# Patient Record
Sex: Female | Born: 1940
Health system: Southern US, Community
[De-identification: ages and names within clinical notes are randomized; demographics above are authoritative.]

## PROBLEM LIST (undated history)

## (undated) DIAGNOSIS — E785 Hyperlipidemia, unspecified: Secondary | ICD-10-CM

## (undated) DIAGNOSIS — M1990 Unspecified osteoarthritis, unspecified site: Secondary | ICD-10-CM

## (undated) DIAGNOSIS — K219 Gastro-esophageal reflux disease without esophagitis: Secondary | ICD-10-CM

## (undated) DIAGNOSIS — R011 Cardiac murmur, unspecified: Secondary | ICD-10-CM

## (undated) DIAGNOSIS — I10 Essential (primary) hypertension: Secondary | ICD-10-CM

## (undated) DIAGNOSIS — E119 Type 2 diabetes mellitus without complications: Secondary | ICD-10-CM

## (undated) DIAGNOSIS — I38 Endocarditis, valve unspecified: Secondary | ICD-10-CM

## (undated) DIAGNOSIS — M069 Rheumatoid arthritis, unspecified: Secondary | ICD-10-CM

## (undated) DIAGNOSIS — D649 Anemia, unspecified: Secondary | ICD-10-CM

## (undated) DIAGNOSIS — Z972 Presence of dental prosthetic device (complete) (partial): Secondary | ICD-10-CM

## (undated) HISTORY — PX: ABDOMINAL HYSTERECTOMY: SHX81

## (undated) HISTORY — PX: TONSILLECTOMY: SUR1361

## (undated) HISTORY — PX: CATARACT EXTRACTION: SUR2

## (undated) HISTORY — PX: OTHER SURGICAL HISTORY: SHX169

---

## 2005-06-07 ENCOUNTER — Ambulatory Visit: Payer: Self-pay

## 2005-12-05 ENCOUNTER — Ambulatory Visit: Payer: Self-pay | Admitting: General Surgery

## 2005-12-07 ENCOUNTER — Other Ambulatory Visit: Payer: Self-pay

## 2005-12-07 ENCOUNTER — Ambulatory Visit: Payer: Self-pay | Admitting: General Surgery

## 2005-12-14 ENCOUNTER — Ambulatory Visit: Payer: Self-pay | Admitting: General Surgery

## 2006-08-01 ENCOUNTER — Ambulatory Visit: Payer: Self-pay | Admitting: Internal Medicine

## 2007-09-11 ENCOUNTER — Ambulatory Visit: Payer: Self-pay | Admitting: Internal Medicine

## 2007-12-26 ENCOUNTER — Ambulatory Visit: Payer: Self-pay | Admitting: Gastroenterology

## 2008-04-30 ENCOUNTER — Ambulatory Visit: Payer: Self-pay | Admitting: Internal Medicine

## 2008-09-06 ENCOUNTER — Inpatient Hospital Stay: Payer: Self-pay | Admitting: *Deleted

## 2008-10-08 ENCOUNTER — Ambulatory Visit: Payer: Self-pay | Admitting: Internal Medicine

## 2008-11-03 ENCOUNTER — Ambulatory Visit: Payer: Self-pay | Admitting: Unknown Physician Specialty

## 2009-10-22 ENCOUNTER — Ambulatory Visit: Payer: Self-pay | Admitting: Internal Medicine

## 2010-11-16 ENCOUNTER — Ambulatory Visit: Payer: Self-pay | Admitting: Ophthalmology

## 2010-11-25 ENCOUNTER — Ambulatory Visit: Payer: Self-pay

## 2011-12-07 ENCOUNTER — Ambulatory Visit: Payer: Self-pay

## 2012-12-09 ENCOUNTER — Ambulatory Visit: Payer: Self-pay | Admitting: Physician Assistant

## 2013-12-10 ENCOUNTER — Ambulatory Visit: Payer: Self-pay

## 2014-06-16 ENCOUNTER — Ambulatory Visit: Payer: Self-pay | Admitting: Orthopedic Surgery

## 2014-08-05 ENCOUNTER — Ambulatory Visit: Payer: Self-pay | Admitting: Physician Assistant

## 2014-09-01 ENCOUNTER — Ambulatory Visit
Admit: 2014-09-01 | Disposition: A | Discharge status: Home / Self Care | Payer: Self-pay | Attending: Physician Assistant | Admitting: Physician Assistant

## 2014-12-15 ENCOUNTER — Other Ambulatory Visit: Payer: Self-pay | Admitting: Internal Medicine

## 2014-12-15 ENCOUNTER — Ambulatory Visit
Admission: RE | Admit: 2014-12-15 | Discharge: 2014-12-15 | Disposition: A | Discharge status: Home / Self Care | Payer: Commercial Managed Care - HMO | Source: Ambulatory Visit | Attending: Internal Medicine | Admitting: Internal Medicine

## 2014-12-15 DIAGNOSIS — Z1231 Encounter for screening mammogram for malignant neoplasm of breast: Secondary | ICD-10-CM

## 2015-06-09 DIAGNOSIS — M0579 Rheumatoid arthritis with rheumatoid factor of multiple sites without organ or systems involvement: Secondary | ICD-10-CM | POA: Diagnosis not present

## 2015-07-08 ENCOUNTER — Other Ambulatory Visit: Payer: Self-pay | Admitting: Physician Assistant

## 2015-07-08 ENCOUNTER — Ambulatory Visit
Admission: RE | Admit: 2015-07-08 | Discharge: 2015-07-08 | Disposition: A | Discharge status: Home / Self Care | Payer: Medicare HMO | Source: Ambulatory Visit | Attending: Physician Assistant | Admitting: Physician Assistant

## 2015-07-08 DIAGNOSIS — M79604 Pain in right leg: Secondary | ICD-10-CM | POA: Diagnosis not present

## 2015-07-08 DIAGNOSIS — M79661 Pain in right lower leg: Secondary | ICD-10-CM | POA: Diagnosis not present

## 2015-07-08 DIAGNOSIS — L03115 Cellulitis of right lower limb: Secondary | ICD-10-CM | POA: Diagnosis not present

## 2015-07-09 DIAGNOSIS — Z23 Encounter for immunization: Secondary | ICD-10-CM | POA: Diagnosis not present

## 2015-07-12 DIAGNOSIS — Z79899 Other long term (current) drug therapy: Secondary | ICD-10-CM | POA: Diagnosis not present

## 2015-07-13 ENCOUNTER — Encounter: Payer: Medicare HMO | Attending: Internal Medicine | Admitting: Internal Medicine

## 2015-07-13 DIAGNOSIS — N182 Chronic kidney disease, stage 2 (mild): Secondary | ICD-10-CM | POA: Insufficient documentation

## 2015-07-13 DIAGNOSIS — E1122 Type 2 diabetes mellitus with diabetic chronic kidney disease: Secondary | ICD-10-CM | POA: Diagnosis not present

## 2015-07-13 DIAGNOSIS — E1151 Type 2 diabetes mellitus with diabetic peripheral angiopathy without gangrene: Secondary | ICD-10-CM | POA: Insufficient documentation

## 2015-07-13 DIAGNOSIS — L989 Disorder of the skin and subcutaneous tissue, unspecified: Secondary | ICD-10-CM | POA: Diagnosis not present

## 2015-07-13 DIAGNOSIS — M79605 Pain in left leg: Secondary | ICD-10-CM | POA: Diagnosis not present

## 2015-07-13 DIAGNOSIS — L97211 Non-pressure chronic ulcer of right calf limited to breakdown of skin: Secondary | ICD-10-CM | POA: Insufficient documentation

## 2015-07-13 DIAGNOSIS — Z992 Dependence on renal dialysis: Secondary | ICD-10-CM | POA: Diagnosis not present

## 2015-07-13 DIAGNOSIS — R69 Illness, unspecified: Secondary | ICD-10-CM | POA: Diagnosis not present

## 2015-07-13 DIAGNOSIS — E11622 Type 2 diabetes mellitus with other skin ulcer: Secondary | ICD-10-CM | POA: Diagnosis not present

## 2015-07-13 DIAGNOSIS — I129 Hypertensive chronic kidney disease with stage 1 through stage 4 chronic kidney disease, or unspecified chronic kidney disease: Secondary | ICD-10-CM | POA: Insufficient documentation

## 2015-07-14 NOTE — Progress Notes (Signed)
[name], [name] ([id number]) Visit Report for 07/13/2015 Abuse/Suicide Risk Screen Details Patient Name: [name], [name]. Date of Service: 07/13/2015 8:00 AM Medical Record Patient Account Number: [id number] Number: Treating RN: Montey Hora 1941-03-12 (75 y.o. Other Clinician: Date of Birth/Sex: Female) Treating ROBSON, MICHAEL Primary Care Physician/Extender: Irene Limbo Physician: Referring Physician: Clayborn Bigness Weeks in Treatment: 0 Abuse/Suicide Risk Screen Items Answer ABUSE/SUICIDE RISK SCREEN: Has anyone close to you tried to hurt or harm you recentlyo No Do you feel uncomfortable with anyone in your familyo No Has anyone forced you do things that you didnot want to doo No Do you have any thoughts of harming yourselfo No Patient displays signs or symptoms of abuse and/or neglect. No Electronic Signature(s) Signed: 07/13/2015 4:27:35 PM By: Montey Hora Entered By: Montey Hora on 07/13/2015 08:29:25 [place], Luiza LMarland Kitchen ([id number]) -------------------------------------------------------------------------------- Activities of Daily Living Details Patient Name: [name], [name] Date of Service: 07/13/2015 8:00 AM Medical Record Patient Account Number: [id number] Number: Treating RN: Montey Hora 1941/03/10 (75 y.o. Other Clinician: Date of Birth/Sex: Female) Treating ROBSON, MICHAEL Primary Care Physician/Extender: Irene Limbo Physician: Referring Physician: Clayborn Bigness Weeks in Treatment: 0 Activities of Daily Living Items Answer Activities of Daily Living (Please select one for each item) Drive Automobile Completely Able Take Medications Completely Able Use Telephone Completely Able Care for Appearance Completely Able Use Toilet Completely Able Bath / Shower Completely Able Dress Self Completely Able Feed Self Completely Able Walk Completely Able Get In / Out Bed Completely Able Housework Completely Able Prepare  Meals Completely Able Handle Money Completely Able Shop for Self Completely Able Electronic Signature(s) Signed: 07/13/2015 4:27:35 PM By: Montey Hora Entered By: Montey Hora on 07/13/2015 08:29:42 Ballo, Rollen Sox ([id number]) -------------------------------------------------------------------------------- Education Assessment Details Patient Name: Becky Stall L. Date of Service: 07/13/2015 8:00 AM Medical Record Patient Account Number: [id number] Number: Treating RN: Montey Hora 1940/12/07 (75 y.o. Other Clinician: Date of Birth/Sex: Female) Treating ROBSON, MICHAEL Primary Care Physician/Extender: Irene Limbo Physician: Referring Physician: Clayborn Bigness Weeks in Treatment: 0 Primary Learner Assessed: Patient Learning Preferences/Education Level/Primary Language Learning Preference: Explanation, Demonstration Highest Education Level: High School Preferred Language: English Cognitive Barrier Assessment/Beliefs Language Barrier: No Translator Needed: No Memory Deficit: No Emotional Barrier: No Cultural/Religious Beliefs Affecting Medical No Care: Physical Barrier Assessment Impaired Vision: No Impaired Hearing: No Decreased Hand dexterity: No Knowledge/Comprehension Assessment Knowledge Level: Medium Comprehension Level: Medium Ability to understand written Medium instructions: Ability to understand verbal Medium instructions: Motivation Assessment Anxiety Level: Calm Cooperation: Cooperative Education Importance: Acknowledges Need Interest in Health Problems: Asks Questions Perception: Coherent Willingness to Engage in Self- Medium Management Activities: Medium IDELL, DUQUAINE ([id number]) Readiness to Engage in Self- Management Activities: Electronic Signature(s) Signed: 07/13/2015 4:27:35 PM By: Montey Hora Entered By: Montey Hora on 07/13/2015 08:30:45 Teasdale, Halina L.  ([id number]) -------------------------------------------------------------------------------- Fall Risk Assessment Details Patient Name: Becky Stall L. Date of Service: 07/13/2015 8:00 AM Medical Record Patient Account Number: [id number] Number: Treating RN: Montey Hora [birth year] (75 y.o. Other Clinician: Date of Birth/Sex: Female) Treating ROBSON, MICHAEL Primary Care Physician/Extender: Irene Limbo Physician: Referring Physician: Clayborn Bigness Weeks in Treatment: 0 Fall Risk Assessment Items Have you had 2 or more falls in the last 12 monthso 0 No Have you had any fall that resulted in injury in the last 12 monthso 0 No FALL RISK ASSESSMENT: History of falling - immediate or within 3 months 0 No Secondary diagnosis 0 No Ambulatory aid None/bed rest/wheelchair/nurse 0 Yes Crutches/cane/walker  0 No Furniture 0 No IV Access/Saline Lock 0 No Gait/Training Normal/bed rest/immobile 0 Yes Weak 0 No Impaired 0 No Mental Status Oriented to own ability 0 Yes Electronic Signature(s) Signed: 07/13/2015 4:27:35 PM By: Montey Hora Entered By: Montey Hora on 07/13/2015 08:30:54 Ramsay, Chere L. ([id number]) -------------------------------------------------------------------------------- Foot Assessment Details Patient Name: [name], [name] Date of Service: 07/13/2015 8:00 AM Medical Record Patient Account Number: [id number] Number: Treating RN: Montey Hora 1941/05/29 (75 y.o. Other Clinician: Date of Birth/Sex: Female) Treating ROBSON, MICHAEL Primary Care Physician/Extender: Irene Limbo Physician: Referring Physician: Clayborn Bigness Weeks in Treatment: 0 Foot Assessment Items Site Locations + = Sensation present, - = Sensation absent, C = Callus, U = Ulcer R = Redness, W = Warmth, M = Maceration, PU = Pre-ulcerative lesion F = Fissure, S = Swelling, D = Dryness Assessment Right: Left: Other Deformity: No No Prior Foot Ulcer: No  No Prior Amputation: No No Charcot Joint: No No Ambulatory Status: Ambulatory Without Help Gait: Steady Electronic Signature(s) Signed: 07/13/2015 4:27:35 PM By: Montey Hora Entered By: Montey Hora on 07/13/2015 08:31:19 Streater, Stella L. ([id number]) [place], [place] ([id number]) -------------------------------------------------------------------------------- Nutrition Risk Assessment Details Patient Name: Mcglory, Leanda L. Date of Service: 07/13/2015 8:00 AM Medical Record Patient Account Number: [id number] Number: Treating RN: Montey Hora 1941/05/05 (74 y.o. Other Clinician: Date of Birth/Sex: Female) Treating ROBSON, MICHAEL Primary Care Physician/Extender: Irene Limbo Physician: Referring Physician: Clayborn Bigness Weeks in Treatment: 0 Height (in): 62 Weight (lbs): 151 Body Mass Index (BMI): 27.6 Nutrition Risk Assessment Items NUTRITION RISK SCREEN: I have an illness or condition that made me change the kind and/or 0 No amount of food I eat I eat fewer than two meals per day 0 No I eat few fruits and vegetables, or milk products 0 No I have three or more drinks of beer, liquor or wine almost every day 0 No I have tooth or mouth problems that make it hard for me to eat 0 No I don't always have enough money to buy the food I need 0 No I eat alone most of the time 0 No I take three or more different prescribed or over-the-counter drugs a 1 Yes day Without wanting to, I have lost or gained 10 pounds in the last six 0 No months I am not always physically able to shop, cook and/or feed myself 0 No Nutrition Protocols Good Risk Protocol 0 No interventions needed Moderate Risk Protocol Electronic Signature(s) Signed: 07/13/2015 4:27:35 PM By: Montey Hora Entered By: Montey Hora on 07/13/2015 08:31:01

## 2015-07-15 NOTE — Progress Notes (Signed)
[name], [name] ([id number]) Visit Report for 07/13/2015 Allergy List Details Patient Name: [name], [name]. Date of Service: 07/13/2015 8:00 AM Medical Record Patient Account Number: [id number] Number: Treating RN: Montey Hora [birth year] (74 y.o. Other Clinician: Date of Birth/Sex: Female) Treating ROBSON, MICHAEL Primary Care Physician: Leretha Pol Physician/Extender: G Referring Physician: Clayborn Bigness Weeks in Treatment: 0 Allergies Active Allergies No Known Allergies Allergy Notes Electronic Signature(s) Signed: 07/13/2015 4:27:35 PM By: Montey Hora Entered By: Montey Hora on 07/13/2015 08:29:13 [name], [name] ([id number]) -------------------------------------------------------------------------------- Arrival Information Details Patient Name: Becky Stall L. Date of Service: 07/13/2015 8:00 AM Medical Record Patient Account Number: [id number] Number: Treating RN: Montey Hora 10-19-1940 (74 y.o. Other Clinician: Date of Birth/Sex: Female) Treating ROBSON, [place] Primary Care Physician: Leretha Pol Physician/Extender: G Referring Physician: Thomasenia Bottoms in Treatment: 0 Visit Information Patient Arrived: Ambulatory Arrival Time: 08:20 Accompanied By: self Transfer Assistance: None Patient Identification Verified: Yes Secondary Verification Process Yes Completed: Patient Has Alerts: Yes Patient Alerts: DMII NO ABI R LEG R/T PAIN Electronic Signature(s) Signed: 07/13/2015 4:27:35 PM By: Montey Hora Entered By: Montey Hora on 07/13/2015 08:45:41 [name], [name] ([id number]) -------------------------------------------------------------------------------- Clinic Level of Care Assessment Details Patient Name: Becky Stall L. Date of Service: 07/13/2015 8:00 AM Medical Record Patient Account Number: [id number] Number: Treating RN: Montey Hora 1941/05/25 (74 y.o. Other Clinician: Date of  Birth/Sex: Female) Treating ROBSON, [place] Primary Care Physician: Leretha Pol Physician/Extender: G Referring Physician: Clayborn Bigness Weeks in Treatment: 0 Clinic Level of Care Assessment Items TOOL 2 Quantity Score []  - Use when only an EandM is performed on the INITIAL visit 0 ASSESSMENTS - Nursing Assessment / Reassessment X - General Physical Exam (combine w/ comprehensive assessment (listed just 1 20 below) when performed on new pt. evals) X - Comprehensive Assessment (HX, ROS, Risk Assessments, Wounds Hx, etc.) 1 25 ASSESSMENTS - Wound and Skin Assessment / Reassessment []  - Simple Wound Assessment / Reassessment - one wound 0 []  - Complex Wound Assessment / Reassessment - multiple wounds 0 X - Dermatologic / Skin Assessment (not related to wound area) 1 10 ASSESSMENTS - Ostomy and/or Continence Assessment and Care []  - Incontinence Assessment and Management 0 []  - Ostomy Care Assessment and Management (repouching, etc.) 0 PROCESS - Coordination of Care X - Simple Patient / Family Education for ongoing care 1 15 []  - Complex (extensive) Patient / Family Education for ongoing care 0 X - Staff obtains Programmer, systems, Records, Test Results / Process Orders 1 10 []  - Staff telephones HHA, Nursing Homes / Clarify orders / etc 0 []  - Routine Transfer to another Facility (non-emergent condition) 0 []  - Routine Hospital Admission (non-emergent condition) 0 X - New Admissions / Biomedical engineer / Ordering NPWT, Apligraf, etc. 1 15 []  - Emergency Hospital Admission (emergent condition) 0 [name], [name] ([id number]) X - Simple Discharge Coordination 1 10 []  - Complex (extensive) Discharge Coordination 0 PROCESS - Special Needs []  - Pediatric / Minor Patient Management 0 []  - Isolation Patient Management 0 []  - Hearing / Language / Visual special needs 0 []  - Assessment of Community assistance (transportation, D/C planning, etc.) 0 []  - Additional assistance / Altered  mentation 0 []  - Support Surface(s) Assessment (bed, cushion, seat, etc.) 0 INTERVENTIONS - Wound Cleansing / Measurement []  - Wound Imaging (photographs - any number of wounds) 0 []  - Wound Tracing (instead of photographs) 0 []  - Simple Wound Measurement - one wound 0 []  - Complex Wound Measurement - multiple  wounds 0 []  - Simple Wound Cleansing - one wound 0 []  - Complex Wound Cleansing - multiple wounds 0 INTERVENTIONS - Wound Dressings []  - Small Wound Dressing one or multiple wounds 0 []  - Medium Wound Dressing one or multiple wounds 0 []  - Large Wound Dressing one or multiple wounds 0 []  - Application of Medications - injection 0 INTERVENTIONS - Miscellaneous []  - External ear exam 0 []  - Specimen Collection (cultures, biopsies, blood, body fluids, etc.) 0 []  - Specimen(s) / Culture(s) sent or taken to Lab for analysis 0 []  - Patient Transfer (multiple staff / Harrel Lemon Lift / Similar devices) 0 []  - Simple Staple / Suture removal (25 or less) 0 [name], [name] ([id number]) []  - Complex Staple / Suture removal (26 or more) 0 []  - Hypo / Hyperglycemic Management (close monitor of Blood Glucose) 0 X - Ankle / Brachial Index (ABI) - do not check if billed separately 1 15 Has the patient been seen at the hospital within the last three years: Yes Total Score: 120 Level Of Care: New/Established - Level 4 Electronic Signature(s) Signed: 07/13/2015 4:27:35 PM By: Montey Hora Entered By: Montey Hora on 07/13/2015 09:02:58 [name], [name] ([id number]) -------------------------------------------------------------------------------- Encounter Discharge Information Details Patient Name: Becky Stall L. Date of Service: 07/13/2015 8:00 AM Medical Record Patient Account Number: [id number] Number: Treating RN: Montey Hora July 06, 1940 (74 y.o. Other Clinician: Date of Birth/Sex: Female) Treating ROBSON, MICHAEL Primary Care Physician: Leretha Pol Physician/Extender: G Referring Physician: Thomasenia Bottoms in Treatment: 0 Encounter Discharge Information Items Discharge Pain Level: 0 Discharge Condition: Stable Ambulatory Status: Ambulatory Discharge Destination: Home Transportation: Private Auto Accompanied By: self Schedule Follow-up Appointment: Yes Medication Reconciliation completed and provided to Patient/Care No [name]: Provided on Clinical Summary of Care: 07/13/2015 Form Type Recipient Paper Patient MB Electronic Signature(s) Signed: 07/13/2015 9:14:14 AM By: Ruthine Dose Entered By: Ruthine Dose on 07/13/2015 09:14:14 [name], [name] ([id number]) -------------------------------------------------------------------------------- Lower Extremity Assessment Details Patient Name: Obryan, Sindee L. Date of Service: 07/13/2015 8:00 AM Medical Record Patient Account Number: [id number] Number: Treating RN: Montey Hora 1941/04/10 (74 y.o. Other Clinician: Date of Birth/Sex: Female) Treating ROBSON, [place] Primary Care Physician: Leretha Pol Physician/Extender: G Referring Physician: Clayborn Bigness Weeks in Treatment: 0 Edema Assessment Assessed: [Left: No] [Right: No] Edema: [Left: Yes] [Right: Yes] Calf Left: Right: Point of Measurement: 30 cm From Medial Instep 34.7 cm 35.9 cm Ankle Left: Right: Point of Measurement: 10 cm From Medial Instep 20.6 cm 21.3 cm Vascular Assessment Pulses: Posterior Tibial Palpable: [Left:Yes] [Right:Yes] Doppler: [Left:Multiphasic] [Right:Multiphasic] Dorsalis Pedis Palpable: [Left:Yes] [Right:Yes] Doppler: [Left:Monophasic] [Right:Multiphasic] Extremity colors, hair growth, and conditions: Extremity Color: [Left:Hyperpigmented] [Right:Hyperpigmented] Hair Growth on Extremity: [Left:No] [Right:No] Temperature of Extremity: [Left:Hot] [Right:Warm] Capillary Refill: [Left:< 3 seconds] [Right:< 3 seconds] Blood Pressure: Brachial:  [Left:156] Dorsalis Pedis: 190 [Left:Dorsalis Pedis:] Ankle: Posterior Tibial: 192 [Left:Posterior Tibial: 1.23] Toe Nail Assessment Left: Right: Thick: Yes Yes Discolored: Yes Yes [name], [name] ([id number]) Deformed: No No Improper Length and Hygiene: No No Notes PT COULD NOT TOLERATE ABI IN R LEG R/T PAIN Electronic Signature(s) Signed: 07/13/2015 4:27:35 PM By: Montey Hora Entered By: Montey Hora on 07/13/2015 08:45:21 [name], [name] ([id number]) -------------------------------------------------------------------------------- Multi Wound Chart Details Patient Name: [name], [name] Date of Service: 07/13/2015 8:00 AM Medical Record Patient Account Number: [id number] Number: Treating RN: Montey Hora 1940-11-20 (74 y.o. Other Clinician: Date of Birth/Sex: Female) Treating ROBSON, [place] Primary Care Physician: Leretha Pol Physician/Extender: G Referring Physician: Thomasenia Bottoms  in Treatment: 0 Vital Signs Height(in): 62 Pulse(bpm): 79 Weight(lbs): 151 Blood Pressure 156/90 (mmHg): Body Mass Index(BMI): 28 Temperature(F): 97.8 Respiratory Rate 18 (breaths/min): Wound Assessments Treatment Notes Electronic Signature(s) Signed: 07/13/2015 4:27:35 PM By: Montey Hora Entered By: Montey Hora on 07/13/2015 09:07:54 [name], [name] ([id number]) -------------------------------------------------------------------------------- [place] Details Patient Name: Becky Stall L. Date of Service: 07/13/2015 8:00 AM Medical Record Patient Account Number: [id number] Number: Treating RN: Montey Hora 02/21/1941 (74 y.o. Other Clinician: Date of Birth/Sex: Female) Treating ROBSON, [place] Primary Care Physician: Leretha Pol Physician/Extender: G Referring Physician: Clayborn Bigness Weeks in Treatment: 0 Active Inactive Orientation to the Wound Care Program Nursing Diagnoses: Knowledge deficit  related to the wound healing center program Goals: Patient/caregiver will verbalize understanding of the [place] Program Date Initiated: 07/13/2015 Goal Status: Active Interventions: Provide education on orientation to the wound center Notes: Electronic Signature(s) Signed: 07/13/2015 4:27:35 PM By: Montey Hora Entered By: Montey Hora on 07/13/2015 09:07:40 Lefevre, [name] ([id number]) -------------------------------------------------------------------------------- Patient/Caregiver Education Details Patient Name: Becky Stall L. Date of Service: 07/13/2015 8:00 AM Medical Record Patient Account Number: [id number] Number: Treating RN: Montey Hora 06-28-1940 (74 y.o. Other Clinician: Date of Birth/Gender: Female) Treating ROBSON, [place] Primary Care Physician: Leretha Pol Physician/Extender: G Referring Physician: Thomasenia Bottoms in Treatment: 0 Education Assessment Education Provided To: Patient Education Topics Provided Basic Hygiene: Handouts: Other: skin care with lotion and ace wrap Methods: Explain/Verbal Responses: State content correctly Electronic Signature(s) Signed: 07/13/2015 4:27:35 PM By: Montey Hora Entered By: Montey Hora on 07/13/2015 09:08:13 Schramm, Lurena L. ([id number]) -------------------------------------------------------------------------------- [place] Details Patient Name: Becky Stall L. Date of Service: 07/13/2015 8:00 AM Medical Record Patient Account Number: [id number] Number: Treating RN: Montey Hora 1940/11/16 (74 y.o. Other Clinician: Date of Birth/Sex: Female) Treating ROBSON, [place] Primary Care Physician: Leretha Pol Physician/Extender: G Referring Physician: Clayborn Bigness Weeks in Treatment: 0 Vital Signs Time Taken: 08:25 Temperature (F): 97.8 Height (in): 62 Pulse (bpm): 79 Source: Stated Respiratory Rate (breaths/min): 18 Weight (lbs): 151 Blood Pressure  (mmHg): 156/90 Source: Measured Reference Range: 80 - 120 mg / dl Body Mass Index (BMI): 27.6 Electronic Signature(s) Signed: 07/13/2015 4:27:35 PM By: Montey Hora Entered By: Montey Hora on 07/13/2015 08:28:36

## 2015-07-15 NOTE — Progress Notes (Signed)
Becky, [name] ([id number]) Visit Report for 07/13/2015 Chief Complaint Document Details Patient Name: [name], [name]. Date of Service: 07/13/2015 8:00 AM Medical Record Patient Account Number: [id number] Number: Treating RN: Montey Hora 1941-03-11 (75 y.o. Other Clinician: Date of Birth/Sex: Female) Treating ROBSON, MICHAEL Primary Care Physician/Extender: Irene Limbo Physician: Referring Physician: Clayborn Bigness Weeks in Treatment: 0 Information Obtained from: Patient Chief Complaint Patient is referred here for erythema and swelling on the medial right leg. There is actually not a wound Electronic Signature(s) Signed: 07/14/2015 5:04:49 PM By: Linton Ham MD Entered By: Linton Ham on 07/13/2015 09:28:10 [name], [name] ([id number]) -------------------------------------------------------------------------------- HPI Details Patient Name: [name] L. Date of Service: 07/13/2015 8:00 AM Medical Record Patient Account Number: [id number] Number: Treating RN: Montey Hora 1940/09/19 (75 y.o. Other Clinician: Date of Birth/Sex: Female) Treating ROBSON, MICHAEL Primary Care Physician/Extender: Irene Limbo Physician: Referring Physician: Clayborn Bigness Weeks in Treatment: 0 History of Present Illness HPI Description: 07/13/15; this is a patient who was sent here for review of an area on her right medial leg. She was seen by her primary care doctor last week and started on Keflex. Venous studies did not show DVT or evidence of reflux. She does not actually have an open wound. She tells me that roughly 3 weeks ago she developed some erythema and swelling on the medial right leg making it look different from her left leg. This has progressed and become very tender. She has not been systemically unwell. She is a diabetic. She is also recently treated for herpes zoster over her left anterior lower thigh. She has chronic  renal insufficiency and is listed as having PAD. Her ABI in this clinic was 1.23 on the left. Could not be obtained on the right due to pain. She was also sent for a duplex ultrasound of the right leg that did not show evidence of a DVT or reflux Electronic Signature(s) Signed: 07/14/2015 5:04:49 PM By: Linton Ham MD Entered By: Linton Ham on 07/13/2015 10:56:28 [name], [name] ([id number]) -------------------------------------------------------------------------------- Physical Exam Details Patient Name: [name], [name] Date of Service: 07/13/2015 8:00 AM Medical Record Patient Account Number: [id number] Number: Treating RN: Montey Hora 04-10-1941 (75 y.o. Other Clinician: Date of Birth/Sex: Female) Treating ROBSON, MICHAEL Primary Care Physician/Extender: Irene Limbo Physician: Referring Physician: Clayborn Bigness Weeks in Treatment: 0 Constitutional Patient is hypertensive.. Pulse regular and within target range for patient.Marland Kitchen Respirations regular, non-labored and within target range.. Temperature is normal and within the target range for the patient.Marland Kitchen Respiratory Respiratory effort is easy and symmetric bilaterally. Rate is normal at rest and on room air.. Bilateral breath sounds are clear and equal in all lobes with no wheezes, rales or rhonchi.. Cardiovascular 2/6 systolic ejection murmur that does not radiate. On the left I can feel both dorsalis pedis and posterior tibial pulses. On the right I can only feel her posterior tibial.. What looks to be an area of venous insufficiency with hemosiderin deposition in the right medial leg. Lymphatic Inguinal and popliteal nodes are not palpable. Integumentary (Hair, Skin) Healing lesions on her left anterior thigh above the knee compatible with the advertised zoster. Notes Wound exam; there is no open wound per se. She does have a discolored area on the right medial leg from her ankle going caudally.  Above the discolored area there appears to be some firmness. The entire area is tender. It doesn't really have the appearance of cellulitis. Electronic Signature(s) Signed: 07/14/2015 5:04:49 PM By:  Linton Ham MD Entered By: Linton Ham on 07/13/2015 10:52:33 [name] ([id number]) -------------------------------------------------------------------------------- Physician Orders Details Patient Name: [name] L. Date of Service: 07/13/2015 8:00 AM Medical Record Patient Account Number: [id number] Number: Treating RN: Montey Hora [birth year] (75 y.o. Other Clinician: Date of Birth/Sex: Female) Treating ROBSON, MICHAEL Primary Care Physician/Extender: Irene Limbo Physician: Referring Physician: Clayborn Bigness Weeks in Treatment: 0 Verbal / Phone Orders: Yes Clinician: Montey Hora Read Back and Verified: Yes Diagnosis Coding ICD-10 Coding Code Description [id number] Non-pressure chronic ulcer of right calf limited to breakdown of skin E11.622 Type 2 diabetes mellitus with other skin ulcer E11.51 Type 2 diabetes mellitus with diabetic peripheral angiopathy without gangrene E11.22 Type 2 diabetes mellitus with diabetic chronic kidney disease Follow-up Appointments o Return Appointment in 1 week. Edema Control o Other: - ace wrap daily Electronic Signature(s) Signed: 07/13/2015 4:27:35 PM By: Montey Hora Signed: 07/14/2015 5:04:49 PM By: Linton Ham MD Entered By: Montey Hora on 07/13/2015 09:07:24 [name], [name] ([id number]) -------------------------------------------------------------------------------- Problem List Details Patient Name: [name], [name] Date of Service: 07/13/2015 8:00 AM Medical Record Patient Account Number: [id number] Number: Treating RN: Montey Hora Nov 16, 1940 (75 y.o. Other Clinician: Date of Birth/Sex: Female) Treating ROBSON, MICHAEL Primary Care Physician/Extender: Irene Limbo Physician: Referring Physician: Clayborn Bigness Weeks in Treatment: 0 Active Problems ICD-10 Encounter Code Description Active Date Diagnosis L97.211 Non-pressure chronic ulcer of right calf limited to 07/13/2015 Yes breakdown of skin E11.622 Type 2 diabetes mellitus with other skin ulcer 07/13/2015 Yes E11.51 Type 2 diabetes mellitus with diabetic peripheral 07/13/2015 Yes angiopathy without gangrene E11.22 Type 2 diabetes mellitus with diabetic chronic kidney 07/13/2015 Yes disease Inactive Problems Resolved Problems Electronic Signature(s) Signed: 07/14/2015 5:04:49 PM By: Linton Ham MD Entered By: Linton Ham on 07/13/2015 09:27:42 Sherk, Samara L. ([id number]) -------------------------------------------------------------------------------- Progress Note Details Patient Name: Mesmer, [name] Date of Service: 07/13/2015 8:00 AM Medical Record Patient Account Number: [id number] Number: Treating RN: Montey Hora 06-21-1940 (74 y.o. Other Clinician: Date of Birth/Sex: Female) Treating ROBSON, MICHAEL Primary Care Physician/Extender: Irene Limbo Physician: Referring Physician: Clayborn Bigness Weeks in Treatment: 0 Subjective Chief Complaint Information obtained from Patient Patient is referred here for erythema and swelling on the medial right leg. There is actually not a wound History of Present Illness (HPI) 07/13/15; this is a patient who was sent here for review of an area on her right medial leg. She was seen by her primary care doctor last week and started on Keflex. Venous studies did not show DVT or evidence of reflux. She does not actually have an open wound. She tells me that roughly 3 weeks ago she developed some erythema and swelling on the medial right leg making it look different from her left leg. This has progressed and become very tender. She has not been systemically unwell. She is a diabetic. She is also recently treated for  herpes zoster over her left anterior lower thigh. She has chronic renal insufficiency and is listed as having PAD. Her ABI in this clinic was 1.23 on the left. Could not be obtained on the right due to pain. Wound History Patient reportedly has not tested positive for osteomyelitis. Patient reportedly has had testing performed to evaluate circulation in the legs. Patient experiences the following problems associated with their wounds: infection. Patient History Information obtained from Patient. Allergies No Known Allergies Social History Never smoker, Marital Status - Widowed, Alcohol Use - Never, Drug Use - No History, Caffeine Use -  Never. Medical History Cardiovascular Patient has history of Hypertension Endocrine Valli, Chester. ([id number]) Patient has history of Type II Diabetes Musculoskeletal Patient has history of Osteoarthritis Oncologic Denies history of Received Chemotherapy, Received Radiation Patient is treated with Oral Agents. Blood sugar is tested. Medical And Surgical History Notes Immunological shingles 2 months ago Review of Systems (ROS) Constitutional Symptoms (General Health) The patient has no complaints or symptoms. Eyes The patient has no complaints or symptoms. Ear/Nose/Mouth/Throat The patient has no complaints or symptoms. Hematologic/Lymphatic The patient has no complaints or symptoms. Respiratory The patient has no complaints or symptoms. Cardiovascular The patient has no complaints or symptoms. Gastrointestinal The patient has no complaints or symptoms. Genitourinary Complains or has symptoms of Kidney failure/ Dialysis - CKD stage 2. Immunological The patient has no complaints or symptoms. Integumentary (Skin) The patient has no complaints or symptoms. Neurologic The patient has no complaints or symptoms. Oncologic The patient has no complaints or symptoms. Psychiatric The patient has no complaints or  symptoms. Objective Constitutional Patient is hypertensive.. Pulse regular and within target range for patient.Marland Kitchen Respirations regular, non-labored and within target range.. Temperature is normal and within the target range for the patient.Marland Kitchen Lyster, [name] ([id number]) Vitals Time Taken: 8:25 AM, Height: 62 in, Source: Stated, Weight: 151 lbs, Source: Measured, BMI: 27.6, Temperature: 97.8 F, Pulse: 79 bpm, Respiratory Rate: 18 breaths/min, Blood Pressure: 156/90 mmHg. Respiratory Respiratory effort is easy and symmetric bilaterally. Rate is normal at rest and on room air.. Bilateral breath sounds are clear and equal in all lobes with no wheezes, rales or rhonchi.. Cardiovascular 2/6 systolic ejection murmur that does not radiate. On the left I can feel both dorsalis pedis and posterior tibial pulses. On the right I can only feel her posterior tibial.. What looks to be an area of venous insufficiency with hemosiderin deposition in the right medial leg. Lymphatic Inguinal and popliteal nodes are not palpable. General Notes: Wound exam; there is no open wound per se. She does have a discolored area on the right medial leg from her ankle going caudally. Above the discolored area there appears to be some firmness. The entire area is tender. It doesn't really have the appearance of cellulitis. Integumentary (Hair, Skin) Healing lesions on her left anterior thigh above the knee compatible with the advertised zoster. Assessment Active Problems ICD-10 L97.211 - Non-pressure chronic ulcer of right calf limited to breakdown of skin E11.622 - Type 2 diabetes mellitus with other skin ulcer E11.51 - Type 2 diabetes mellitus with diabetic peripheral angiopathy without gangrene E11.22 - Type 2 diabetes mellitus with diabetic chronic kidney disease Plan Follow-up Appointments: Return Appointment in 1 week. Edema Control: Other: - ace wrap daily Bendall, Angely L. ([id number]) I am not  completely certain what we are dealing with here. Her primary physician thought that this was cellulitis and put her on Keflex late last week. I am not certain that there is been any improvement. The patient denies any trauma eyes somewhat worried about a hematoma nevertheless to. She is only on aspirin. I think the correct approach here is to put some support over the skin that and we have done a light Ace wrap and shown her how to do this. She follows up with her primary doctor on Friday. If this progresses she may need a CT scan of her leg. I will look at this again next week Electronic Signature(s) Signed: 07/14/2015 5:04:49 PM By: Linton Ham MD Entered By: Linton Ham on 07/13/2015 10:54:56 Moorman, Becky  L. ([id number]) -------------------------------------------------------------------------------- ROS/PFSH Details Patient Name: Mckoy, [name] Date of Service: 07/13/2015 8:00 AM Medical Record Patient Account Number: [id number] Number: Treating RN: Montey Hora March 03, 1941 (74 y.o. Other Clinician: Date of Birth/Sex: Female) Treating ROBSON, MICHAEL Primary Care Physician/Extender: Irene Limbo Physician: Referring Physician: Clayborn Bigness Weeks in Treatment: 0 Information Obtained From Patient Wound History Do you currently have one or more open woundso No Have you tested positive for osteomyelitis (bone infection)o No Have you had any tests for circulation on your legso Yes Who ordered the testo PCP [facility] was the test [facility]o [facility] Have you had other problems associated with your woundso Infection Genitourinary Complaints and Symptoms: Positive for: Kidney failure/ Dialysis - CKD stage 2 Constitutional Symptoms (General Health) Complaints and Symptoms: No Complaints or Symptoms Eyes Complaints and Symptoms: No Complaints or Symptoms Ear/Nose/Mouth/Throat Complaints and Symptoms: No Complaints or Symptoms Hematologic/Lymphatic Complaints and  Symptoms: No Complaints or Symptoms Respiratory Complaints and Symptoms: No Complaints or Symptoms Alridge, Keyon L. ([id number]) Cardiovascular Complaints and Symptoms: No Complaints or Symptoms Medical History: Positive for: Hypertension Gastrointestinal Complaints and Symptoms: No Complaints or Symptoms Endocrine Medical History: Positive for: Type II Diabetes Time with diabetes: 8 years Treated with: Oral agents Blood sugar tested every day: Yes Tested : BID Immunological Complaints and Symptoms: No Complaints or Symptoms Medical History: Past Medical History Notes: shingles 2 months ago Integumentary (Skin) Complaints and Symptoms: No Complaints or Symptoms Musculoskeletal Medical History: Positive for: Osteoarthritis Neurologic Complaints and Symptoms: No Complaints or Symptoms Oncologic Complaints and Symptoms: No Complaints or Symptoms Medical History: Kostka, [name] ([id number]) Negative for: Received Chemotherapy; Received Radiation Psychiatric Complaints and Symptoms: No Complaints or Symptoms Family and Social History Never smoker; Marital Status - Widowed; Alcohol Use: Never; Drug Use: No History; Caffeine Use: Never; Financial Concerns: No; Food, Clothing or Shelter Needs: No; Support System Lacking: No; Transportation Concerns: No; Advanced Directives: No; Patient does not want information on Advanced Directives Electronic Signature(s) Signed: 07/13/2015 4:27:35 PM By: Montey Hora Signed: 07/14/2015 5:04:49 PM By: Linton Ham MD Entered By: Montey Hora on 07/13/2015 08:35:38 Caya, [name] ([id number]) -------------------------------------------------------------------------------- [place] Details Patient Name: Nierman, [name] Date of Service: 07/13/2015 Medical Record Patient Account Number: [id number] Number: Treating RN: Montey Hora 1941-03-04 (74 y.o. Other Clinician: Date of Birth/Sex: Female) Treating  ROBSON, MICHAEL Primary Care Physician/Extender: Irene Limbo Physician: Weeks in Treatment: 0 Referring Physician: Clayborn Bigness Diagnosis Coding ICD-10 Codes Code Description [id number] Non-pressure chronic ulcer of right calf limited to breakdown of skin E11.622 Type 2 diabetes mellitus with other skin ulcer E11.51 Type 2 diabetes mellitus with diabetic peripheral angiopathy without gangrene E11.22 Type 2 diabetes mellitus with diabetic chronic kidney disease Facility Procedures CPT4 Code: [id number] Description: 99214 - WOUND CARE VISIT-LEV 4 EST PT Modifier: Quantity: 1 Physician Procedures CPT4 Code Description: [id number] WC PHYS LEVEL 3 o NEW PT ICD-10 Description Diagnosis L97.211 Non-pressure chronic ulcer of right calf limited t Modifier: o breakdown of Quantity: 1 skin Electronic Signature(s) Signed: 07/14/2015 5:04:49 PM By: Linton Ham MD Entered By: Linton Ham on 07/13/2015 10:55:30

## 2015-07-16 DIAGNOSIS — M7981 Nontraumatic hematoma of soft tissue: Secondary | ICD-10-CM | POA: Diagnosis not present

## 2015-07-20 ENCOUNTER — Encounter: Payer: Medicare HMO | Admitting: Internal Medicine

## 2015-07-20 DIAGNOSIS — I129 Hypertensive chronic kidney disease with stage 1 through stage 4 chronic kidney disease, or unspecified chronic kidney disease: Secondary | ICD-10-CM | POA: Diagnosis not present

## 2015-07-20 DIAGNOSIS — E11622 Type 2 diabetes mellitus with other skin ulcer: Secondary | ICD-10-CM | POA: Diagnosis not present

## 2015-07-20 DIAGNOSIS — E11628 Type 2 diabetes mellitus with other skin complications: Secondary | ICD-10-CM | POA: Diagnosis not present

## 2015-07-20 DIAGNOSIS — L97211 Non-pressure chronic ulcer of right calf limited to breakdown of skin: Secondary | ICD-10-CM | POA: Diagnosis not present

## 2015-07-20 DIAGNOSIS — E1151 Type 2 diabetes mellitus with diabetic peripheral angiopathy without gangrene: Secondary | ICD-10-CM | POA: Diagnosis not present

## 2015-07-20 DIAGNOSIS — N182 Chronic kidney disease, stage 2 (mild): Secondary | ICD-10-CM | POA: Diagnosis not present

## 2015-07-20 DIAGNOSIS — L989 Disorder of the skin and subcutaneous tissue, unspecified: Secondary | ICD-10-CM | POA: Diagnosis not present

## 2015-07-20 DIAGNOSIS — E1122 Type 2 diabetes mellitus with diabetic chronic kidney disease: Secondary | ICD-10-CM | POA: Diagnosis not present

## 2015-07-20 DIAGNOSIS — Z992 Dependence on renal dialysis: Secondary | ICD-10-CM | POA: Diagnosis not present

## 2015-07-21 NOTE — Progress Notes (Signed)
[name], [name] ([id number]) Visit Report for 07/20/2015 Arrival Information Details Patient Name: [name], [name]. Date of Service: 07/20/2015 8:00 AM Medical Record Patient Account Number: [id number] Number: Treating RN: Montey Hora [birth year] (74 y.o. Other Clinician: Date of Birth/Sex: Female) Treating ROBSON, [place] Primary Care Physician: Leretha Pol Physician/Extender: G Referring Physician: Aquilla Hacker in Treatment: 1 Visit Information History Since Last Visit Added or deleted any medications: No Patient Arrived: Ambulatory Any new allergies or adverse reactions: No Arrival Time: 08:04 Had a fall or experienced change in No Accompanied By: self activities of daily living that may affect Transfer Assistance: None risk of falls: Patient Identification Verified: Yes Signs or symptoms of abuse/neglect since last No Secondary Verification Process Yes visito Completed: Hospitalized since last visit: No Patient Has Alerts: Yes Pain Present Now: No Patient Alerts: DMII NO ABI R LEG R/T PAIN Electronic Signature(s) Signed: 07/20/2015 12:56:21 PM By: Montey Hora Entered By: Montey Hora on 07/20/2015 08:06:55 Sanmiguel, Kendria L. ([id number]) -------------------------------------------------------------------------------- Clinic Level of Care Assessment Details Patient Name: Becky Stall L. Date of Service: 07/20/2015 8:00 AM Medical Record Patient Account Number: [id number] Number: Treating RN: Montey Hora 03/10/1941 (74 y.o. Other Clinician: Date of Birth/Sex: Female) Treating ROBSON, [place] Primary Care Physician: Leretha Pol Physician/Extender: G Referring Physician: Aquilla Hacker in Treatment: 1 Clinic Level of Care Assessment Items TOOL 4 Quantity Score []  - Use when only an EandM is performed on FOLLOW-UP visit 0 ASSESSMENTS - Nursing Assessment / Reassessment X - Reassessment of Co-morbidities  (includes updates in patient status) 1 10 X - Reassessment of Adherence to Treatment Plan 1 5 ASSESSMENTS - Wound and Skin Assessment / Reassessment []  - Simple Wound Assessment / Reassessment - one wound 0 []  - Complex Wound Assessment / Reassessment - multiple wounds 0 X - Dermatologic / Skin Assessment (not related to wound area) 1 10 ASSESSMENTS - Focused Assessment X - Circumferential Edema Measurements - multi extremities 1 5 []  - Nutritional Assessment / Counseling / Intervention 0 X - Lower Extremity Assessment (monofilament, tuning fork, pulses) 1 5 []  - Peripheral Arterial Disease Assessment (using hand held doppler) 0 ASSESSMENTS - Ostomy and/or Continence Assessment and Care []  - Incontinence Assessment and Management 0 []  - Ostomy Care Assessment and Management (repouching, etc.) 0 PROCESS - Coordination of Care X - Simple Patient / Family Education for ongoing care 1 15 []  - Complex (extensive) Patient / Family Education for ongoing care 0 []  - Staff obtains Programmer, systems, Records, Test Results / Process Orders 0 []  - Staff telephones HHA, Nursing Homes / Clarify orders / etc 0 Gaida, [name] ([id number]) []  - Routine Transfer to another Facility (non-emergent condition) 0 []  - Routine Hospital Admission (non-emergent condition) 0 []  - New Admissions / Biomedical engineer / Ordering NPWT, Apligraf, etc. 0 []  - Emergency Hospital Admission (emergent condition) 0 X - Simple Discharge Coordination 1 10 []  - Complex (extensive) Discharge Coordination 0 PROCESS - Special Needs []  - Pediatric / Minor Patient Management 0 []  - Isolation Patient Management 0 []  - Hearing / Language / Visual special needs 0 []  - Assessment of Community assistance (transportation, D/C planning, etc.) 0 []  - Additional assistance / Altered mentation 0 []  - Support Surface(s) Assessment (bed, cushion, seat, etc.) 0 INTERVENTIONS - Wound Cleansing / Measurement []  - Simple Wound Cleansing - one  wound 0 []  - Complex Wound Cleansing - multiple wounds 0 []  - Wound Imaging (photographs - any number of wounds) 0 []  - Wound Tracing (  instead of photographs) 0 []  - Simple Wound Measurement - one wound 0 []  - Complex Wound Measurement - multiple wounds 0 INTERVENTIONS - Wound Dressings []  - Small Wound Dressing one or multiple wounds 0 []  - Medium Wound Dressing one or multiple wounds 0 []  - Large Wound Dressing one or multiple wounds 0 []  - Application of Medications - topical 0 []  - Application of Medications - injection 0 Branton, [name] ([id number]) INTERVENTIONS - Miscellaneous []  - External ear exam 0 []  - Specimen Collection (cultures, biopsies, blood, body fluids, etc.) 0 []  - Specimen(s) / Culture(s) sent or taken to Lab for analysis 0 []  - Patient Transfer (multiple staff / Harrel Lemon Lift / Similar devices) 0 []  - Simple Staple / Suture removal (25 or less) 0 []  - Complex Staple / Suture removal (26 or more) 0 []  - Hypo / Hyperglycemic Management (close monitor of Blood Glucose) 0 []  - Ankle / Brachial Index (ABI) - do not check if billed separately 0 X - Vital Signs 1 5 Has the patient been seen at the hospital within the last three years: Yes Total Score: 65 Level Of Care: New/Established - Level 2 Electronic Signature(s) Signed: 07/20/2015 12:56:21 PM By: Montey Hora Entered By: Montey Hora on 07/20/2015 08:30:10 [name], [name] ([id number]) -------------------------------------------------------------------------------- Encounter Discharge Information Details Patient Name: Becky Stall L. Date of Service: 07/20/2015 8:00 AM Medical Record Patient Account Number: [id number] Number: Treating RN: Montey Hora 1940-08-05 (74 y.o. Other Clinician: Date of Birth/Sex: Female) Treating ROBSON, MICHAEL Primary Care Physician: Leretha Pol Physician/Extender: G Referring Physician: Aquilla Hacker in Treatment: 1 Encounter Discharge  Information Items Discharge Pain Level: 0 Discharge Condition: Stable Ambulatory Status: Ambulatory Discharge Destination: Home Transportation: Private Auto Accompanied By: self Schedule Follow-up Appointment: Yes Medication Reconciliation completed and provided to Patient/Care No Kwamaine Cuppett: Provided on Clinical Summary of Care: 07/20/2015 Form Type Recipient Paper Patient MB Electronic Signature(s) Signed: 07/20/2015 8:39:32 AM By: Sharon Mt Entered By: Sharon Mt on 07/20/2015 08:39:32 Preis, [name] ([id number]) -------------------------------------------------------------------------------- Lower Extremity Assessment Details Patient Name: [name], [name] Date of Service: 07/20/2015 8:00 AM Medical Record Patient Account Number: [id number] Number: Treating RN: Montey Hora 1940-09-27 (74 y.o. Other Clinician: Date of Birth/Sex: Female) Treating ROBSON, Elias-Fela Solis Primary Care Physician: Leretha Pol Physician/Extender: G Referring Physician: Leretha Pol Weeks in Treatment: 1 Edema Assessment Assessed: [Left: No] [Right: No] Edema: [Left: Ye] [Right: s] Calf Left: Right: Point of Measurement: 30 cm From Medial Instep cm 35.4 cm Ankle Left: Right: Point of Measurement: 10 cm From Medial Instep cm 21.4 cm Vascular Assessment Pulses: Posterior Tibial Dorsalis Pedis Palpable: [Right:Yes] Extremity colors, hair growth, and conditions: Extremity Color: [Right:Hyperpigmented] Hair Growth on Extremity: [Right:No] Temperature of Extremity: [Right:Warm] Capillary Refill: [Right:< 3 seconds] Electronic Signature(s) Signed: 07/20/2015 12:56:21 PM By: Montey Hora Entered By: Montey Hora on 07/20/2015 08:13:41 Casamento, Nadelyn L. ([id number]) -------------------------------------------------------------------------------- Multi Wound Chart Details Patient Name: [name], [name] Date of Service: 07/20/2015 8:00 AM Medical Record Patient Account  Number: [id number] Number: Treating RN: Montey Hora 10/15/1940 (74 y.o. Other Clinician: Date of Birth/Sex: Female) Treating ROBSON, [place] Primary Care Physician: Leretha Pol Physician/Extender: G Referring Physician: Leretha Pol Weeks in Treatment: 1 Vital Signs Height(in): 62 Pulse(bpm): 56 Weight(lbs): 151 Blood Pressure 159/53 (mmHg): Body Mass Index(BMI): 28 Temperature(F): 98.4 Respiratory Rate 18 (breaths/min): Wound Assessments Treatment Notes Electronic Signature(s) Signed: 07/20/2015 12:56:21 PM By: Montey Hora Entered By: Montey Hora on 07/20/2015 08:29:04 [name], [name] ([id number]) -------------------------------------------------------------------------------- [place] Details  Patient Name: [name], EULL. Date of Service: 07/20/2015 8:00 AM Medical Record Patient Account Number: [id number] Number: Treating RN: Montey Hora Sep 29, 1940 (74 y.o. Other Clinician: Date of Birth/Sex: Female) Treating ROBSON, [place] Primary Care Physician: Leretha Pol Physician/Extender: G Referring Physician: Aquilla Hacker in Treatment: 1 Active Inactive Orientation to the Wound Care Program Nursing Diagnoses: Knowledge deficit related to the wound healing center program Goals: Patient/caregiver will verbalize understanding of the [place] Program Date Initiated: 07/13/2015 Goal Status: Active Interventions: Provide education on orientation to the wound center Notes: Electronic Signature(s) Signed: 07/20/2015 12:56:21 PM By: Montey Hora Entered By: Montey Hora on 07/20/2015 08:28:57 Dellarocco, [name] ([id number]) -------------------------------------------------------------------------------- Patient/Caregiver Education Details Patient Name: Becky Stall L. Date of Service: 07/20/2015 8:00 AM Medical Record Patient Account Number: [id number] Number: Treating  RN: Montey Hora 21-Dec-1940 (74 y.o. Other Clinician: Date of Birth/Gender: Female) Treating ROBSON, [place] Primary Care Physician: Leretha Pol Physician/Extender: G Referring Physician: Aquilla Hacker in Treatment: 1 Education Assessment Education Provided To: Patient Education Topics Provided Basic Hygiene: Handouts: Other: ace wrap daily Methods: Demonstration, Explain/Verbal Responses: State content correctly Electronic Signature(s) Signed: 07/20/2015 12:56:21 PM By: Montey Hora Entered By: Montey Hora on 07/20/2015 08:30:48 Police, Mysti L. ([id number]) -------------------------------------------------------------------------------- Vitals Details Patient Name: Becky Stall L. Date of Service: 07/20/2015 8:00 AM Medical Record Patient Account Number: [id number] Number: Treating RN: Montey Hora 1940/10/12 (74 y.o. Other Clinician: Date of Birth/Sex: Female) Treating ROBSON, [place] Primary Care Physician: Leretha Pol Physician/Extender: G Referring Physician: Leretha Pol Weeks in Treatment: 1 Vital Signs Time Taken: 08:07 Temperature (F): 98.4 Height (in): 62 Pulse (bpm): 56 Weight (lbs): 151 Respiratory Rate (breaths/min): 18 Body Mass Index (BMI): 27.6 Blood Pressure (mmHg): 159/53 Reference Range: 80 - 120 mg / dl Electronic Signature(s) Signed: 07/20/2015 12:56:21 PM By: Montey Hora Entered By: Montey Hora on 07/20/2015 08:10:49

## 2015-07-21 NOTE — Progress Notes (Addendum)
GENEVIVE, FARES ([id number]) Visit Report for 07/20/2015 Chief Complaint Document Details Patient Name: [name], [name]. Date of Service: 07/20/2015 8:00 AM Medical Record Patient Account Number: [id number] Number: Treating RN: Montey Hora March 15, 1941 (75 y.o. Other Clinician: Date of Birth/Sex: Female) Treating ROBSON, MICHAEL Primary Care Physician/Extender: Irene Limbo Physician: Referring Physician: Aquilla Hacker in Treatment: 1 Information Obtained from: Patient Chief Complaint Patient is referred here for erythema and swelling on the medial right leg. There is actually not a wound Electronic Signature(s) Signed: 07/21/2015 8:05:15 AM By: Linton Ham MD Entered By: Linton Ham on 07/20/2015 08:58:36 [place], [place] ([id number]) -------------------------------------------------------------------------------- HPI Details Patient Name: Becky Stall L. Date of Service: 07/20/2015 8:00 AM Medical Record Patient Account Number: [id number] Number: Treating RN: Montey Hora 1940/07/06 (75 y.o. Other Clinician: Date of Birth/Sex: Female) Treating ROBSON, MICHAEL Primary Care Physician/Extender: Irene Limbo Physician: Referring Physician: Leretha Pol Weeks in Treatment: 1 History of Present Illness HPI Description: 07/13/15; this is a patient who was sent here for review of an area on her right medial leg. She was seen by her primary care doctor last week and started on Keflex. Venous studies did not show DVT or evidence of reflux. She does not actually have an open wound. She tells me that roughly 3 weeks ago she developed some erythema and swelling on the medial right leg making it look different from her left leg. This has progressed and become very tender. She has not been systemically unwell. She is a diabetic. She is also recently treated for herpes zoster over her left anterior lower thigh. She has chronic  renal insufficiency and is listed as having PAD. Her ABI in this clinic was 1.23 on the left. Could not be obtained on the right due to pain. She was also sent for a duplex ultrasound of the right leg that did not show evidence of a DVT or reflux 07/20/15. The patient returns having completed her Keflex. She had a venous duplex that did not show DVT or venous reflux. Once again she returns with a inflamed, warm, hard area on the medial aspect of her right leg. This is exquisitely painful. It is not worse than last week but certainly no better. I have marked the edges of this. She does not have arterial insufficiency with palpable pulses and an ABI of 1.23. I once again reviewed the history here, the patient absolutely denies trauma fever chills. Electronic Signature(s) Signed: 07/21/2015 8:05:15 AM By: Linton Ham MD Entered By: Linton Ham on 07/20/2015 09:00:38 Emanuelson, Rollen Sox ([id number]) -------------------------------------------------------------------------------- Physical Exam Details Patient Name: [name], [name] Date of Service: 07/20/2015 8:00 AM Medical Record Patient Account Number: [id number] Number: Treating RN: Montey Hora 06/04/1940 (75 y.o. Other Clinician: Date of Birth/Sex: Female) Treating ROBSON, MICHAEL Primary Care Physician/Extender: Irene Limbo Physician: Referring Physician: Leretha Pol Weeks in Treatment: 1 Constitutional Patient is hypertensive.. Pulse regular and within target range for patient.Marland Kitchen Respirations regular, non-labored and within target range.. Temperature is normal and within the target range for the patient.. Neck Neck supple and symmetrical. No masses or crepitus. Respiratory Respiratory effort is easy and symmetric bilaterally. Rate is normal at rest and on room air.. Bilateral breath sounds are clear and equal in all lobes with no wheezes, rales or rhonchi.. Cardiovascular 2 out of 6 benign sounding  systolic murmur.. Pedal pulses palpable and strong bilaterally.. There is no edema. Some changes of venous insufficiency on the left leg. Gastrointestinal (GI) Abdomen is soft  and non-distended without masses or tenderness. Bowel sounds active in all quadrants.. No liver or spleen enlargement or tenderness.. Lymphatic There is no popliteal or inguinal adenopathy.. No lymphadenopathy or glandular swelling noted in neck.. Notes Wound exam; there is still no open wound here. On the medial aspect of her right leg once again there is a discolored, erythematous, firm area/mass which is exquisitely painful to the touch. The obvious differential diagnosis here would include a hematoma, cellulitis/abscess, venous insufficiency with inflammation. She has not responded to Lexapro. Duplex ultrasound was negative for DVT. She states this came on acutely a month ago and does not remember any trauma although her history is somewhat vague Electronic Signature(s) Signed: 07/21/2015 8:05:15 AM By: Linton Ham MD Entered By: Linton Ham on 07/20/2015 09:04:49 Strahle, Ceceilia L. ([id number]) -------------------------------------------------------------------------------- Physician Orders Details Patient Name: [name], [name] Date of Service: 07/20/2015 8:00 AM Medical Record Patient Account Number: [id number] Number: Treating RN: Montey Hora 04/22/1941 (75 y.o. Other Clinician: Date of Birth/Sex: Female) Treating ROBSON, MICHAEL Primary Care Physician/Extender: Irene Limbo Physician: Referring Physician: Aquilla Hacker in Treatment: 1 Verbal / Phone Orders: Yes Clinician: Montey Hora Read Back and Verified: Yes Diagnosis Coding Follow-up Appointments o Return Appointment in 1 week. Edema Control o Other: - ace wrap daily Radiology o Magnetic Resonance Imaging (MRI) - Right lower leg o X-ray, lower leg - Right Electronic Signature(s) Signed: 07/22/2015  9:35:44 AM By: Montey Hora Signed: 08/11/2015 4:47:02 PM By: Linton Ham MD Previous Signature: 07/20/2015 12:56:21 PM Version By: Montey Hora Previous Signature: 07/21/2015 8:05:15 AM Version By: Linton Ham MD Entered By: Montey Hora on 07/22/2015 09:35:44 Farha, Brinn L. ([id number]) -------------------------------------------------------------------------------- Problem List Details Patient Name: Bown, Echo L. Date of Service: 07/20/2015 8:00 AM Medical Record Patient Account Number: [id number] Number: Treating RN: Montey Hora January 29, 1941 (74 y.o. 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 ([id number]) -------------------------------------------------------------------------------- Progress Note Details Patient Name: Reier, Diania L. Date of Service: 07/20/2015 8:00 AM Medical Record Patient Account Number: [id number] Number: Treating RN: Montey Hora [birth year] (74 y.o. Other Clinician: Date of Birth/Sex: Female) Treating ROBSON, MICHAEL Primary Care Physician/Extender: Irene Limbo Physician: Referring Physician: Aquilla Hacker in Treatment: 1 Subjective Chief  Complaint Information obtained from Patient Patient is referred here for erythema and swelling on the medial right leg. There is actually not a wound History of Present Illness (HPI) 07/13/15; this is a patient who was sent here for review of an area on her right medial leg. She was seen by her primary care doctor last week and started on Keflex. Venous studies did not show DVT or evidence of reflux. She does not actually have an open wound. She tells me that roughly 3 weeks ago she developed some erythema and swelling on the medial right leg making it look different from her left leg. This has progressed and become very tender. She has not been systemically  unwell. She is a diabetic. She is also recently treated for herpes zoster over her left anterior lower thigh. She has chronic renal insufficiency and is listed as having PAD. Her ABI in this clinic was 1.23 on the left. Could not be obtained on the right due to pain. She was also sent for a duplex ultrasound of the right leg that did not show evidence of a DVT or reflux 07/20/15. The patient returns having completed her Keflex. She had a venous duplex that did not show DVT or venous reflux. Once again she returns with a inflamed, warm, hard area on the medial aspect of her right leg. This is exquisitely painful. It is not worse than last week but certainly no better. I have marked the edges of this. She does not have arterial insufficiency with palpable pulses and an ABI of 1.23. I once again reviewed the history here, the patient absolutely denies trauma fever chills. Objective Constitutional Patient is hypertensive.. Pulse regular and within target range for patient.Marland Kitchen Respirations regular, non-labored and within target range.. Temperature is normal and within the target range for the patient.. Vitals Time Taken: 8:07 AM, Height: 62 in, Weight: 151 lbs, BMI: 27.6, Temperature: 98.4 F, Pulse: 56 bpm, Respiratory Rate: 18 breaths/min, Blood  Pressure: 159/53 mmHg. Ozturk, Kamira L. ([id number]) Neck Neck supple and symmetrical. No masses or crepitus. Respiratory Respiratory effort is easy and symmetric bilaterally. Rate is normal at rest and on room air.. Bilateral breath sounds are clear and equal in all lobes with no wheezes, rales or rhonchi.. Cardiovascular 2 out of 6 benign sounding systolic murmur.. Pedal pulses palpable and strong bilaterally.. There is no edema. Some changes of venous insufficiency on the left leg. Gastrointestinal (GI) Abdomen is soft and non-distended without masses or tenderness. Bowel sounds active in all quadrants.. No liver or spleen enlargement or tenderness.. Lymphatic There is no popliteal or inguinal adenopathy.. No lymphadenopathy or glandular swelling noted in neck.. General Notes: Wound exam; there is still no open wound here. On the medial aspect of her right leg once again there is a discolored, erythematous, firm area/mass which is exquisitely painful to the touch. The obvious differential diagnosis here would include a hematoma, cellulitis/abscess, venous insufficiency with inflammation. She has not responded to Lexapro. Duplex ultrasound was negative for DVT. She states this came on acutely a month ago and does not remember any trauma although her history is somewhat vague Assessment Active Problems ICD-10 L97.211 - Non-pressure chronic ulcer of right calf limited to breakdown of skin E11.622 - Type 2 diabetes mellitus with other skin ulcer E11.51 - Type 2 diabetes mellitus with diabetic peripheral angiopathy without gangrene E11.22 - Type 2 diabetes mellitus with diabetic chronic kidney disease Plan Follow-up Appointments: Return Appointment in 1 week. Edema Control: Other: - ace wrap daily Radiology ordered were: KANDREA, BRINKERHOFF ([id number]) Magnetic Resonance Imaging (MRI) - Right lower leg #1 she has no open wound, we have Ace wrap to her lower leg and shown her how to  do this. #2 she has some degree of renal insufficiency related to her diabetes therefore we ordered an MRI, this should be done with contrast. #3 gone ahead and given her 7 days' worth of doxycycline although I am not at all certain that this is cellulitis. She already completed a course of Keflex as noted above Electronic Signature(s) Signed: 07/21/2015 8:05:15 AM By: Linton Ham MD Entered By: Linton Ham on 07/20/2015 09:06:22 Lafont, Rollen Sox ([id number]) -------------------------------------------------------------------------------- SuperBill Details Patient Name: Cherlyn Cushing,  Viridiana L. Date of Service: 07/20/2015 Medical Record Patient Account Number: [id number] Number: Treating RN: Montey Hora 08-Aug-1940 (74 y.o. Other Clinician: Date of Birth/Sex: Female) Treating ROBSON, MICHAEL Primary Care Physician/Extender: Irene Limbo Physician: Weeks in Treatment: 1 Referring Physician: Leretha Pol Diagnosis Coding ICD-10 Codes Code Description [id number] Non-pressure chronic ulcer of right calf limited to breakdown of skin E11.622 Type 2 diabetes mellitus with other skin ulcer E11.51 Type 2 diabetes mellitus with diabetic peripheral angiopathy without gangrene E11.22 Type 2 diabetes mellitus with diabetic chronic kidney disease Facility Procedures CPT4 Code: [id number] Description: [phone] - WOUND CARE VISIT-LEV 2 EST PT Modifier: Quantity: 1 Physician Procedures CPT4 Code: [id number] Description: [id number] - WC PHYS LEVEL 3 - EST PT ICD-10 Description Diagnosis E11.622 Type 2 diabetes mellitus with other skin ulcer Modifier: Quantity: 1 Electronic Signature(s) Signed: 07/21/2015 8:05:15 AM By: Linton Ham MD Entered By: Linton Ham on 07/20/2015 09:06:56

## 2015-07-26 ENCOUNTER — Ambulatory Visit
Admission: RE | Admit: 2015-07-26 | Discharge: 2015-07-26 | Disposition: A | Discharge status: Home / Self Care | Payer: Medicare HMO | Source: Ambulatory Visit | Attending: Internal Medicine | Admitting: Internal Medicine

## 2015-07-26 ENCOUNTER — Other Ambulatory Visit: Payer: Self-pay | Admitting: Internal Medicine

## 2015-07-26 DIAGNOSIS — R2241 Localized swelling, mass and lump, right lower limb: Secondary | ICD-10-CM | POA: Diagnosis not present

## 2015-07-27 ENCOUNTER — Other Ambulatory Visit: Payer: Self-pay | Admitting: Internal Medicine

## 2015-07-27 ENCOUNTER — Encounter: Payer: Medicare HMO | Admitting: Internal Medicine

## 2015-07-27 DIAGNOSIS — E1151 Type 2 diabetes mellitus with diabetic peripheral angiopathy without gangrene: Secondary | ICD-10-CM | POA: Diagnosis not present

## 2015-07-27 DIAGNOSIS — E11622 Type 2 diabetes mellitus with other skin ulcer: Secondary | ICD-10-CM | POA: Diagnosis not present

## 2015-07-27 DIAGNOSIS — L989 Disorder of the skin and subcutaneous tissue, unspecified: Secondary | ICD-10-CM | POA: Diagnosis not present

## 2015-07-27 DIAGNOSIS — I129 Hypertensive chronic kidney disease with stage 1 through stage 4 chronic kidney disease, or unspecified chronic kidney disease: Secondary | ICD-10-CM | POA: Diagnosis not present

## 2015-07-27 DIAGNOSIS — E11628 Type 2 diabetes mellitus with other skin complications: Secondary | ICD-10-CM | POA: Diagnosis not present

## 2015-07-27 DIAGNOSIS — N182 Chronic kidney disease, stage 2 (mild): Secondary | ICD-10-CM | POA: Diagnosis not present

## 2015-07-27 DIAGNOSIS — L97211 Non-pressure chronic ulcer of right calf limited to breakdown of skin: Secondary | ICD-10-CM

## 2015-07-27 DIAGNOSIS — E1122 Type 2 diabetes mellitus with diabetic chronic kidney disease: Secondary | ICD-10-CM | POA: Diagnosis not present

## 2015-07-27 DIAGNOSIS — Z992 Dependence on renal dialysis: Secondary | ICD-10-CM | POA: Diagnosis not present

## 2015-07-28 NOTE — Progress Notes (Signed)
Becky, [name] ([id number]) Visit Report for 07/27/2015 Chief Complaint Document Details Patient Name: [name], [name]. Date of Service: 07/27/2015 8:00 AM Medical Record Patient Account Number: [id number] Number: Treating RN: Montey Hora 1940-07-28 (74 y.o. Other Clinician: Date of Birth/Sex: Female) Treating ROBSON, MICHAEL Primary Care Physician/Extender: Irene Limbo Physician: Referring Physician: Aquilla Hacker in Treatment: 2 Information Obtained from: Patient Chief Complaint Patient is referred here for erythema and swelling on the medial right leg. There is actually not a wound Electronic Signature(s) Signed: 07/28/2015 8:09:37 AM By: Linton Ham MD Entered By: Linton Ham on 07/27/2015 09:04:36 Becky, Blue [name] ([id number]) -------------------------------------------------------------------------------- HPI Details Patient Name: Becky Stall L. Date of Service: 07/27/2015 8:00 AM Medical Record Patient Account Number: [id number] Number: Treating RN: Montey Hora 04-13-1941 (74 y.o. Other Clinician: Date of Birth/Sex: Female) Treating ROBSON, MICHAEL Primary Care Physician/Extender: Irene Limbo Physician: Referring Physician: Leretha Pol Weeks in Treatment: 2 History of Present Illness HPI Description: 07/13/15; this is a patient who was sent here for review of an area on her right medial leg. She was seen by her primary care doctor last week and started on Keflex. Venous studies did not show DVT or evidence of reflux. She does not actually have an open wound. She tells me that roughly 3 weeks ago she developed some erythema and swelling on the medial right leg making it look different from her left leg. This has progressed and become very tender. She has not been systemically unwell. She is a diabetic. She is also recently treated for herpes zoster over her left anterior lower thigh. She has chronic  renal insufficiency and is listed as having PAD. Her ABI in this clinic was 1.23 on the left. Could not be obtained on the right due to pain. She was also sent for a duplex ultrasound of the right leg that did not show evidence of a DVT or reflux 07/20/15. The patient returns having completed her Keflex. She had a venous duplex that did not show DVT or venous reflux. Once again she returns with a inflamed, warm, hard area on the medial aspect of her right leg. This is exquisitely painful. It is not worse than last week but certainly no better. I have marked the edges of this. She does not have arterial insufficiency with palpable pulses and an ABI of 1.23. I once again reviewed the history here, the patient absolutely denies trauma fever chills. 07/27/15; the patient's area on the medial aspect of her right leg appears to be less swollen but it is still warm and and exquisitely tender. She didn't start doxycycline last week I will continue that at this week although I'm not the least bit sure that this is cellulitis. She had an x-ray done of her leg that was negative [insurance insistence as a prelude to an [place]. The patient has simply been placed wrapping this area Electronic Signature(s) Signed: 07/28/2015 8:09:37 AM By: Linton Ham MD Entered By: Linton Ham on 07/27/2015 09:06:30 [place], Rollen Sox ([id number]) -------------------------------------------------------------------------------- Physical Exam Details Patient Name: [name], [name] Date of Service: 07/27/2015 8:00 AM Medical Record Patient Account Number: [id number] Number: Treating RN: Montey Hora [birth year] (74 y.o. Other Clinician: Date of Birth/Sex: Female) Treating ROBSON, MICHAEL Primary Care Physician/Extender: Irene Limbo Physician: Referring Physician: Leretha Pol Weeks in Treatment: 2 Constitutional Patient is hypertensive.. Pulse regular and within target range for patient.Marland Kitchen  Respirations regular, non-labored and within target range.. Temperature is normal and within the target range  for the patient.Marland Kitchen Respiratory Respiratory effort is easy and symmetric bilaterally. Rate is normal at rest and on room air.. Bilateral breath sounds are clear and equal in all lobes with no wheezes, rales or rhonchi.. Cardiovascular Heart rhythm and rate regular, without murmur or gallop.. Gastrointestinal (GI) Abdomen is soft and non-distended without masses or tenderness. Bowel sounds active in all quadrants.. No liver or spleen enlargement or tenderness.. Lymphatic No lymph nodes are palpable in the popliteal, inguinal, cervical or axillary areas.. Notes Wound exam; as noted previously there is no open wound about a days discolored very warm and exquisitely tender area on the medial aspect of the leg. The tenderness actually extends perhaps 2-3 cm above the area of discoloration. Superficially this looks like an area of stasis dermatitis/cellulitis although I am just not convinced that what this is. The area is however much less swollen than last week on the doxycycline Electronic Signature(s) Signed: 07/28/2015 8:09:37 AM By: Linton Ham MD Entered By: Linton Ham on 07/27/2015 09:08:55 [name], [name] ([id number]) -------------------------------------------------------------------------------- Physician Orders Details Patient Name: [name], [name] Date of Service: 07/27/2015 8:00 AM Medical Record Patient Account Number: [id number] Number: Treating RN: Montey Hora 03/14/1941 (74 y.o. Other Clinician: Date of Birth/Sex: Female) Treating ROBSON, MICHAEL Primary Care Physician/Extender: Irene Limbo Physician: Referring Physician: Aquilla Hacker in Treatment: 2 Verbal / Phone Orders: Yes Clinician: Montey Hora Read Back and Verified: Yes Diagnosis Coding Follow-up Appointments o Return Appointment in 1 week. Edema Control o  Other: - ace wrap daily Medications-please add to medication list. o P.O. Antibiotics - continue doxycycline 100mg  PO BID for 7 days Notes get MRI authorization and continue with MRI Electronic Signature(s) Signed: 07/27/2015 3:03:51 PM By: Montey Hora Signed: 07/28/2015 8:09:37 AM By: Linton Ham MD Entered By: Montey Hora on 07/27/2015 08:33:34 [name], [name] ([id number]) -------------------------------------------------------------------------------- Problem List Details Patient Name: [name], [name] Date of Service: 07/27/2015 8:00 AM Medical Record Patient Account Number: [id number] Number: Treating RN: Montey Hora [birth year] (74 y.o. 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 ([id number]) -------------------------------------------------------------------------------- Progress Note Details Patient Name: [name], [name] Date of Service: 07/27/2015 8:00 AM Medical Record Patient Account Number: [id number] Number: Treating RN: Montey Hora 04-10-1941 (74 y.o. Other Clinician: Date of Birth/Sex: Female) Treating ROBSON, MICHAEL Primary Care Physician/Extender: Irene Limbo Physician: Referring Physician: Aquilla Hacker in Treatment: 2 Subjective Chief Complaint Information obtained from Patient Patient is referred here for erythema and swelling on the medial right leg. There is actually not a wound History of Present Illness (HPI) 07/13/15; this is a patient who was sent here for review of an area on her right medial leg. She was seen by her primary care doctor last week and started on Keflex. Venous studies did not show DVT or evidence of reflux. She does not actually have an open wound. She tells me that roughly 3 weeks ago she  developed some erythema and swelling on the medial right leg making it look different from her left leg. This has progressed and become very tender. She has not been systemically unwell. She is a diabetic. She is also recently treated for herpes zoster over her left anterior lower thigh. She has chronic renal insufficiency and is listed as having PAD. Her ABI in this clinic was 1.23 on the left. Could not be obtained on the right due to pain. She was also sent for a duplex ultrasound of the right leg that did not show evidence of a DVT or reflux 07/20/15. The patient returns having completed her Keflex. She had a venous duplex that did not show DVT or venous reflux. Once again she returns with a inflamed, warm, hard area on the medial aspect of her right leg. This is exquisitely painful. It is not worse than last week but certainly no better. I have marked the edges of this. She does not have arterial insufficiency with palpable pulses and an ABI of 1.23. I once again reviewed the history here, the patient absolutely denies trauma fever chills. 07/27/15; the patient's area on the medial aspect of her right leg appears to be less swollen but it is still warm and and exquisitely tender. She didn't start doxycycline last week I will continue that at this week although I'm not the least bit sure that this is cellulitis. She  had an x-ray done of her leg that was negative [insurance insistence as a prelude to an [place]. The patient has simply been placed wrapping this area Objective Constitutional Patient is hypertensive.. Pulse regular and within target range for patient.Marland Kitchen Respirations regular, non-labored and within target range.. Temperature is normal and within the target range for the patient.Marland Kitchen Paparella, Tanina L. ([id number]) Vitals Time Taken: 8:15 AM, Height: 62 in, Weight: 151 lbs, BMI: 27.6, Temperature: 98.2 F, Pulse: 56 bpm, Respiratory Rate: 18 breaths/min, Blood Pressure: 165/60 mmHg. Respiratory Respiratory effort is easy and symmetric bilaterally. Rate is normal at rest and on room air.. Bilateral breath sounds are clear and equal in all lobes with no wheezes, rales or rhonchi.. Cardiovascular Heart rhythm and rate regular, without murmur or gallop.. Gastrointestinal (GI) Abdomen is soft and non-distended without masses or tenderness. Bowel sounds active in all quadrants.. No liver or spleen enlargement or tenderness.. Lymphatic No lymph nodes are palpable in the popliteal, inguinal, cervical or axillary areas.. General Notes: Wound exam; as noted previously there is no open wound about a days discolored very warm and exquisitely tender area on the medial aspect of the leg. The tenderness actually extends perhaps 2-3 cm above the area of discoloration. Superficially this looks like an area of stasis dermatitis/cellulitis although I am just not convinced that what this is. The area is however much less swollen than last week on the doxycycline Assessment Active Problems ICD-10 L97.211 - Non-pressure chronic ulcer of right calf limited to breakdown of skin E11.622 - Type 2 diabetes mellitus with other skin ulcer E11.51 - Type 2 diabetes mellitus with diabetic peripheral angiopathy without gangrene E11.22 - Type 2 diabetes mellitus with diabetic chronic kidney disease Plan Follow-up  Appointments: Return Appointment in 1 week. Edema Control: Other: - ace wrap daily Medications-please add to medication list.: [name], [name] ([id number]) P.O. Antibiotics - continue doxycycline 100mg  PO BID for 7 days General Notes: get MRI authorization and continue with MRI #1 continue with the Ace wraps as there is no open wound #2 I'm going to continue the doxycycline  for another week #3 MRI to evaluate the underlying tissue here, depending on this she may need a biopsy although I'm not really sure we would be the best people to do this depending on what it shows Electronic Signature(s) Signed: 07/28/2015 8:09:37 AM By: Linton Ham MD Entered By: Linton Ham on 07/27/2015 09:10:08 [name], [place]. ([id number]) -------------------------------------------------------------------------------- SuperBill Details Patient Name: Becky Stall L. Date of Service: 07/27/2015 Medical Record Patient Account Number: [id number] Number: Treating RN: Montey Hora 04-06-1941 (74 y.o. Other Clinician: Date of Birth/Sex: Female) Treating ROBSON, MICHAEL Primary Care Physician/Extender: Irene Limbo Physician: Weeks in Treatment: 2 Referring Physician: Leretha Pol Diagnosis Coding ICD-10 Codes Code Description [id number] Non-pressure chronic ulcer of right calf limited to breakdown of skin E11.622 Type 2 diabetes mellitus with other skin ulcer E11.51 Type 2 diabetes mellitus with diabetic peripheral angiopathy without gangrene E11.22 Type 2 diabetes mellitus with diabetic chronic kidney disease Facility Procedures CPT4 Code: [id number] Description: [id number] - WOUND CARE VISIT-LEV 2 EST PT Modifier: Quantity: 1 Physician Procedures CPT4 Code Description: [id number] Edmund - WC PHYS LEVEL 3 - EST PT ICD-10 Description Diagnosis L97.211 Non-pressure chronic ulcer of right calf limited to Modifier: breakdown of Quantity: 1 skin Electronic Signature(s) Signed: 07/28/2015  8:09:37 AM By: Linton Ham MD Entered By: Linton Ham on 07/27/2015 09:11:05

## 2015-07-28 NOTE — Progress Notes (Signed)
Becky, [name] ([id number]) Visit Report for 07/27/2015 Arrival Information Details Patient Name: [name], [name]. Date of Service: 07/27/2015 8:00 AM Medical Record Patient Account Number: [id number] Number: Treating RN: Montey Hora 12-Apr-1941 (75 y.o. Other Clinician: Date of Birth/Sex: Female) Treating [name] Primary Care Physician: Leretha Pol Physician/Extender: G Referring Physician: Aquilla Hacker in Treatment: 2 Visit Information History Since Last Visit Added or deleted any medications: No Patient Arrived: Ambulatory Any new allergies or adverse reactions: No Arrival Time: 08:08 Had a fall or experienced change in No Accompanied By: self activities of daily living that may affect Transfer Assistance: None risk of falls: Patient Identification Verified: Yes Signs or symptoms of abuse/neglect since last No Secondary Verification Process Yes visito Completed: Hospitalized since last visit: No Patient Has Alerts: Yes Pain Present Now: No Patient Alerts: DMII NO ABI R LEG R/T PAIN Electronic Signature(s) Signed: 07/27/2015 3:03:51 PM By: Montey Hora Entered By: Montey Hora on 07/27/2015 08:14:23 Bernabei, Graclynn L. ([id number]) -------------------------------------------------------------------------------- Clinic Level of Care Assessment Details Patient Name: Becky Stall L. Date of Service: 07/27/2015 8:00 AM Medical Record Patient Account Number: [id number] Number: Treating RN: Montey Hora 08/29/1940 (75 y.o. Other Clinician: Date of Birth/Sex: Female) Treating [name] Primary Care Physician: Leretha Pol Physician/Extender: G Referring Physician: Aquilla Hacker in Treatment: 2 Clinic Level of Care Assessment Items TOOL 4 Quantity Score []  - Use when only an EandM is performed on FOLLOW-UP visit 0 ASSESSMENTS - Nursing Assessment / Reassessment X - Reassessment of Co-morbidities  (includes updates in patient status) 1 10 X - Reassessment of Adherence to Treatment Plan 1 5 ASSESSMENTS - Wound and Skin Assessment / Reassessment []  - Simple Wound Assessment / Reassessment - one wound 0 []  - Complex Wound Assessment / Reassessment - multiple wounds 0 X - Dermatologic / Skin Assessment (not related to wound area) 1 10 ASSESSMENTS - Focused Assessment X - Circumferential Edema Measurements - multi extremities 1 5 []  - Nutritional Assessment / Counseling / Intervention 0 X - Lower Extremity Assessment (monofilament, tuning fork, pulses) 1 5 []  - Peripheral Arterial Disease Assessment (using hand held doppler) 0 ASSESSMENTS - Ostomy and/or Continence Assessment and Care []  - Incontinence Assessment and Management 0 []  - Ostomy Care Assessment and Management (repouching, etc.) 0 PROCESS - Coordination of Care X - Simple Patient / Family Education for ongoing care 1 15 []  - Complex (extensive) Patient / Family Education for ongoing care 0 []  - Staff obtains Programmer, systems, Records, Test Results / Process Orders 0 []  - Staff telephones HHA, Nursing Homes / Clarify orders / etc 0 Crafton, Uriyah L. ([id number]) []  - Routine Transfer to another Facility (non-emergent condition) 0 []  - Routine Hospital Admission (non-emergent condition) 0 []  - New Admissions / Biomedical engineer / Ordering NPWT, Apligraf, etc. 0 []  - Emergency Hospital Admission (emergent condition) 0 X - Simple Discharge Coordination 1 10 []  - Complex (extensive) Discharge Coordination 0 PROCESS - Special Needs []  - Pediatric / Minor Patient Management 0 []  - Isolation Patient Management 0 []  - Hearing / Language / Visual special needs 0 []  - Assessment of Community assistance (transportation, D/C planning, etc.) 0 []  - Additional assistance / Altered mentation 0 []  - Support Surface(s) Assessment (bed, cushion, seat, etc.) 0 INTERVENTIONS - Wound Cleansing / Measurement []  - Simple Wound Cleansing - one  wound 0 []  - Complex Wound Cleansing - multiple wounds 0 []  - Wound Imaging (photographs - any number of wounds) 0 []  - Wound Tracing (  instead of photographs) 0 []  - Simple Wound Measurement - one wound 0 []  - Complex Wound Measurement - multiple wounds 0 INTERVENTIONS - Wound Dressings []  - Small Wound Dressing one or multiple wounds 0 []  - Medium Wound Dressing one or multiple wounds 0 []  - Large Wound Dressing one or multiple wounds 0 []  - Application of Medications - topical 0 []  - Application of Medications - injection 0 Thurston, Yajahira L. ([id number]) INTERVENTIONS - Miscellaneous []  - External ear exam 0 []  - Specimen Collection (cultures, biopsies, blood, body fluids, etc.) 0 []  - Specimen(s) / Culture(s) sent or taken to Lab for analysis 0 []  - Patient Transfer (multiple staff / Harrel Lemon Lift / Similar devices) 0 []  - Simple Staple / Suture removal (25 or less) 0 []  - Complex Staple / Suture removal (26 or more) 0 []  - Hypo / Hyperglycemic Management (close monitor of Blood Glucose) 0 []  - Ankle / Brachial Index (ABI) - do not check if billed separately 0 X - Vital Signs 1 5 Has the patient been seen at the hospital within the last three years: Yes Total Score: 65 Level Of Care: New/Established - Level 2 Electronic Signature(s) Signed: 07/27/2015 3:03:51 PM By: Montey Hora Entered By: Montey Hora on 07/27/2015 08:30:34 Keisler, Jennife L. ([id number]) -------------------------------------------------------------------------------- Encounter Discharge Information Details Patient Name: Becky Stall L. Date of Service: 07/27/2015 8:00 AM Medical Record Patient Account Number: [id number] Number: Treating RN: Montey Hora 03-13-1941 (75 y.o. Other Clinician: Date of Birth/Sex: Female) Treating ROBSON, [place] Primary Care Physician: Leretha Pol Physician/Extender: G Referring Physician: Aquilla Hacker in Treatment: 2 Encounter Discharge  Information Items Discharge Pain Level: 0 Discharge Condition: Stable Ambulatory Status: Ambulatory Discharge Destination: Home Transportation: Private Auto Accompanied By: self Schedule Follow-up Appointment: Yes Medication Reconciliation completed and provided to Patient/Care No [place] Mielke: Provided on Clinical Summary of Care: 07/27/2015 Form Type Recipient Paper Patient MB Electronic Signature(s) Signed: 07/27/2015 8:38:03 AM By: Ruthine Dose Entered By: Ruthine Dose on 07/27/2015 08:38:03 Shank, Allyson L. ([id number]) -------------------------------------------------------------------------------- Lower Extremity Assessment Details Patient Name: Mells, Mattalynn L. Date of Service: 07/27/2015 8:00 AM Medical Record Patient Account Number: [id number] Number: Treating RN: Montey Hora 04-25-1941 (74 y.o. Other Clinician: Date of Birth/Sex: Female) Treating ROBSON, [place] Primary Care Physician: Leretha Pol Physician/Extender: G Referring Physician: Leretha Pol Weeks in Treatment: 2 Edema Assessment Assessed: [Left: No] [Right: No] Edema: [Left: N] [Right: o] Calf Left: Right: Point of Measurement: 30 cm From Medial Instep cm 34 cm Ankle Left: Right: Point of Measurement: 10 cm From Medial Instep cm 20.5 cm Vascular Assessment Pulses: Posterior Tibial Dorsalis Pedis Palpable: [Right:Yes] Extremity colors, hair growth, and conditions: Extremity Color: [Right:Hyperpigmented] Hair Growth on Extremity: [Right:No] Temperature of Extremity: [Right:Warm] Capillary Refill: [Right:< 3 seconds] Electronic Signature(s) Signed: 07/27/2015 3:03:51 PM By: Montey Hora Entered By: Montey Hora on 07/27/2015 08:17:32 Enck, Irelynd L. ([id number]) -------------------------------------------------------------------------------- Multi Wound Chart Details Patient Name: Seng, Chavie L. Date of Service: 07/27/2015 8:00 AM Medical Record Patient Account  Number: [id number] Number: Treating RN: Montey Hora November 27, 1940 (74 y.o. Other Clinician: Date of Birth/Sex: Female) Treating ROBSON, [place] Primary Care Physician: Leretha Pol Physician/Extender: G Referring Physician: Leretha Pol Weeks in Treatment: 2 Vital Signs Height(in): 62 Pulse(bpm): 56 Weight(lbs): 151 Blood Pressure 165/60 (mmHg): Body Mass Index(BMI): 28 Temperature(F): 98.2 Respiratory Rate 18 (breaths/min): Wound Assessments Treatment Notes Electronic Signature(s) Signed: 07/27/2015 3:03:51 PM By: Montey Hora Entered By: Montey Hora on 07/27/2015 08:28:03 Wehrenberg, Rollen Sox ([id number]) -------------------------------------------------------------------------------- Multi-Disciplinary Care Plan Details  Patient Name: JAHARA, CLUCAS. Date of Service: 07/27/2015 8:00 AM Medical Record Patient Account Number: [id number] Number: Treating RN: Montey Hora [birth year] (74 y.o. Other Clinician: Date of Birth/Sex: Female) Treating ROBSON, [place] Primary Care Physician: Leretha Pol Physician/Extender: G Referring Physician: Aquilla Hacker in Treatment: 2 Active Inactive Orientation to the Wound Care Program Nursing Diagnoses: Knowledge deficit related to the wound healing center program Goals: Patient/caregiver will verbalize understanding of the [facility] Program Date Initiated: 07/13/2015 Goal Status: Active Interventions: Provide education on orientation to the wound center Notes: Electronic Signature(s) Signed: 07/27/2015 3:03:51 PM By: Montey Hora Entered By: Montey Hora on 07/27/2015 08:27:46 Thon, Michalle L. ([id number]) -------------------------------------------------------------------------------- Patient/Caregiver Education Details Patient Name: Becky Stall L. Date of Service: 07/27/2015 8:00 AM Medical Record Patient Account Number: [id number] Number: Treating RN:  Montey Hora Jun 07, 1940 (74 y.o. Other Clinician: Date of Birth/Gender: Female) Treating ROBSON, [place] Primary Care Physician: Leretha Pol Physician/Extender: G Referring Physician: Aquilla Hacker in Treatment: 2 Education Assessment Education Provided To: Patient Education Topics Provided Basic Hygiene: Handouts: Other: continue with ace wrap Methods: Explain/Verbal Responses: State content correctly Electronic Signature(s) Signed: 07/27/2015 3:03:51 PM By: Montey Hora Entered By: Montey Hora on 07/27/2015 08:31:15 Gangi, Charell L. ([id number]) -------------------------------------------------------------------------------- Vitals Details Patient Name: Becky Stall L. Date of Service: 07/27/2015 8:00 AM Medical Record Patient Account Number: [id number] Number: Treating RN: Montey Hora 1941/05/27 (74 y.o. Other Clinician: Date of Birth/Sex: Female) Treating ROBSON, [place] Primary Care Physician: Leretha Pol Physician/Extender: G Referring Physician: Leretha Pol Weeks in Treatment: 2 Vital Signs Time Taken: 08:15 Temperature (F): 98.2 Height (in): 62 Pulse (bpm): 56 Weight (lbs): 151 Respiratory Rate (breaths/min): 18 Body Mass Index (BMI): 27.6 Blood Pressure (mmHg): 165/60 Reference Range: 80 - 120 mg / dl Electronic Signature(s) Signed: 07/27/2015 3:03:51 PM By: Montey Hora Entered By: Montey Hora on 07/27/2015 08:16:13

## 2015-08-03 ENCOUNTER — Encounter: Payer: Medicare HMO | Attending: Internal Medicine | Admitting: Internal Medicine

## 2015-08-03 DIAGNOSIS — N182 Chronic kidney disease, stage 2 (mild): Secondary | ICD-10-CM | POA: Insufficient documentation

## 2015-08-03 DIAGNOSIS — L97211 Non-pressure chronic ulcer of right calf limited to breakdown of skin: Secondary | ICD-10-CM | POA: Insufficient documentation

## 2015-08-03 DIAGNOSIS — I129 Hypertensive chronic kidney disease with stage 1 through stage 4 chronic kidney disease, or unspecified chronic kidney disease: Secondary | ICD-10-CM | POA: Insufficient documentation

## 2015-08-03 DIAGNOSIS — E1122 Type 2 diabetes mellitus with diabetic chronic kidney disease: Secondary | ICD-10-CM | POA: Insufficient documentation

## 2015-08-03 DIAGNOSIS — E11628 Type 2 diabetes mellitus with other skin complications: Secondary | ICD-10-CM | POA: Diagnosis not present

## 2015-08-03 DIAGNOSIS — L03115 Cellulitis of right lower limb: Secondary | ICD-10-CM | POA: Diagnosis not present

## 2015-08-03 DIAGNOSIS — E1151 Type 2 diabetes mellitus with diabetic peripheral angiopathy without gangrene: Secondary | ICD-10-CM | POA: Diagnosis not present

## 2015-08-03 DIAGNOSIS — E11622 Type 2 diabetes mellitus with other skin ulcer: Secondary | ICD-10-CM | POA: Insufficient documentation

## 2015-08-03 DIAGNOSIS — Z992 Dependence on renal dialysis: Secondary | ICD-10-CM | POA: Insufficient documentation

## 2015-08-05 NOTE — Progress Notes (Signed)
MARINN, HOHENBERGER ([id number]) Visit Report for 08/03/2015 Chief Complaint Document Details Patient Name: [name], [name]. Date of Service: 08/03/2015 8:00 AM Medical Record Patient Account Number: [id number] Number: Treating RN: [name] July 24, 1940 (74 y.o. Other Clinician: Date of Birth/Sex: Female) Treating [name] Primary Care Physician/Extender: [name] Physician: Referring Physician: Aquilla [name] in Treatment: 3 Information Obtained from: Patient Chief Complaint Patient is referred here for erythema and swelling on the medial right leg. There is actually not a wound Electronic Signature(s) Signed: 08/04/2015 4:53:46 PM By: [name] Entered By: [name] on 08/03/2015 08:49:37 [place], Crane ([id number]) -------------------------------------------------------------------------------- HPI Details Patient Name: Becky Stall L. Date of Service: 08/03/2015 8:00 AM Medical Record Patient Account Number: [id number] Number: Treating RN: [name] [birth year] (74 y.o. Other Clinician: Date of Birth/Sex: Female) Treating [name] Primary Care Physician/Extender: [name] Physician: Referring Physician: Leretha [name] Weeks in Treatment: 3 History of Present Illness HPI Description: 07/13/15; this is a patient who was sent here for review of an area on her right medial leg. She was seen by her primary care doctor last week and started on Keflex. Venous studies did not show DVT or evidence of reflux. She does not actually have an open wound. She tells me that roughly 3 weeks ago she developed some erythema and swelling on the medial right leg making it look different from her left leg. This has progressed and become very tender. She has not been systemically unwell. She is a diabetic. She is also recently treated for herpes zoster over her left anterior lower thigh. She has chronic  renal insufficiency and is listed as having PAD. Her ABI in this clinic was 1.23 on the left. Could not be obtained on the right due to pain. She was also sent for a duplex ultrasound of the right leg that did not show evidence of a DVT or reflux 07/20/15. The patient returns having completed her Keflex. She had a venous duplex that did not show DVT or venous reflux. Once again she returns with a inflamed, warm, hard area on the medial aspect of her right leg. This is exquisitely painful. It is not worse than last week but certainly no better. I have marked the edges of this. She does not have arterial insufficiency with palpable pulses and an ABI of 1.23. I once again reviewed the history here, the patient absolutely denies trauma fever chills. 07/27/15; the patient's area on the medial aspect of her right leg appears to be less swollen but it is still warm and and exquisitely tender. She didn't start doxycycline last week I will continue that at this week although I'm not the least bit sure that this is cellulitis. She had an x-ray done of her leg that was negative [insurance insistence as a prelude to an [place]. The patient has simply been placed wrapping this area 08/03/15; the area continues to be less swollen but still warm and exquisitely tender. She has a history of PAD but I don't think this is a primary issue. She has a history of diabetes and per the patient rheumatoid arthritis she takes methotrexate and prednisone. Her MRI is not total March 17. Electronic Signature(s) Signed: 08/04/2015 4:53:46 PM By: [name] Entered By: [name] on 08/03/2015 08:51:55 [name] ([id number]) -------------------------------------------------------------------------------- Physical Exam Details Patient Name: [name], [name] Date of Service: 08/03/2015 8:00 AM Medical Record Patient Account Number: [id number] Number: Treating RN: [name] Jun 04, 1940 [phone]  y.o.  Other Clinician: Date of Birth/Sex: Female) Treating [name] Primary Care Physician/Extender: [name] Physician: Referring Physician: Leretha [name] Weeks in Treatment: 3 Constitutional Sitting or standing Blood Pressure is within target range for patient.Marland Kitchen Respirations regular, non-labored and within target range.. Temperature is normal and within the target range for the patient.. Cardiovascular Heart rhythm and rate regular, without murmur or gallop.. Femoral arteries without bruits and pulses strong.Marland Kitchen Lymphatic No popliteal or inguinal adenopathy. Musculoskeletal No active synovotis. Integumentary (Hair, Skin) on the medical elft leg some small discolored papules.. Notes Wound exam; there is no open area here. The large discolored area on the medial aspect of her right ankle is not quite as enlarged swollen. It is however still exquisitely tender and the extent of the tender area is perhaps 2-3 cm above the area of discoloration. Superficially this looks like stasis dermatitis I just don't think that's what this is. She will complete her doxycycline I did not prescribe more. Rheumatoid arthritis can cause a vasculitis although I've never seen this looks quite like this. She does have some nontender nodules on the left medial calf. She follows with a rheumatologist at [place] Signature(s) Signed: 08/04/2015 4:53:46 PM By: [name] Entered By: [name] on 08/03/2015 08:59:10 [name] ([id number]) -------------------------------------------------------------------------------- Physician Orders Details Patient Name: Becky Stall L. Date of Service: 08/03/2015 8:00 AM Medical Record Patient Account Number: [id number] Number: Treating RN: [name] 1940/11/12 (74 y.o. Other Clinician: Date of Birth/Sex: Female) Treating [name] Primary Care Physician/Extender: [name] Physician: Referring Physician: Aquilla [name] in Treatment: 3 Verbal / Phone Orders: Yes Clinician: Montey [name] Read Back and Verified: Yes Diagnosis Coding Follow-up Appointments o Return Appointment in 1 week. Edema Control o Other: - ace wrap daily Medications-please add to medication list. o P.O. Antibiotics - continue doxycycline 100mg  PO BID for 7 days Electronic Signature(s) Signed: 08/03/2015 4:47:49 PM By: [name] Signed: 08/04/2015 4:53:46 PM By: [name] Entered By: [name] on 08/03/2015 08:36:32 [name], [name] ([id number]) -------------------------------------------------------------------------------- Problem List Details Patient Name: [name], [name] Date of Service: 08/03/2015 8:00 AM Medical Record Patient Account Number: [id number] Number: Treating RN: [name] 11-Oct-1940 (74 y.o. 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 ([id number]) -------------------------------------------------------------------------------- Progress Note Details Patient Name: [name], [name] Date of Service: 08/03/2015 8:00 AM Medical Record Patient Account Number:  [id number] Number: Treating RN: [name] 1940-11-09 (74 y.o. Other Clinician: Date of Birth/Sex: Female) Treating [name] Primary Care Physician/Extender: [name] Physician: Referring Physician: Aquilla [name] in Treatment: 3 Subjective Chief Complaint Information obtained from Patient Patient is referred here for erythema and swelling on the medial right leg. There is actually not a wound History of Present Illness (HPI) 07/13/15; this is a patient who was sent here for review of an area on her right medial leg. She was seen  by her primary care doctor last week and started on Keflex. Venous studies did not show DVT or evidence of reflux. She does not actually have an open wound. She tells me that roughly 3 weeks ago she developed some erythema and swelling on the medial right leg making it look different from her left leg. This has progressed and become very tender. She has not been systemically unwell. She is a diabetic. She is also recently treated for herpes zoster over her left anterior lower thigh. She has chronic renal insufficiency and is listed as having PAD. Her ABI in this clinic was 1.23 on the left. Could not be obtained on the right due to pain. She was also sent for a duplex ultrasound of the right leg that did not show evidence of a DVT or reflux 07/20/15. The patient returns having completed her Keflex. She had a venous duplex that did not show DVT or venous reflux. Once again she returns with a inflamed, warm, hard area on the medial aspect of her right leg. This is exquisitely painful. It is not worse than last week but certainly no better. I have marked the edges of this. She does not have arterial insufficiency with palpable pulses and an ABI of 1.23. I once again reviewed the history here, the patient absolutely denies trauma fever chills. 07/27/15; the patient's area on the medial aspect of her right leg appears to be less  swollen but it is still warm and and exquisitely tender. She didn't start doxycycline last week I will continue that at this week although I'm not the least bit sure that this is cellulitis. She had an x-ray done of her leg that was negative [insurance insistence as a prelude to an [place]. The patient has simply been placed wrapping this area 08/03/15; the area continues to be less swollen but still warm and exquisitely tender. She has a history of PAD but I don't think this is a primary issue. She has a history of diabetes and per the patient rheumatoid arthritis she takes methotrexate and prednisone. Her MRI is not total March 17. Objective Constitutional [name], [name] ([id number]) Sitting or standing Blood Pressure is within target range for patient.Marland Kitchen Respirations regular, non-labored and within target range.. Temperature is normal and within the target range for the patient.. Vitals Time Taken: 8:21 AM, Height: 62 in, Weight: 151 lbs, BMI: 27.6, Temperature: 98.3 F, Pulse: 53 bpm, Respiratory Rate: 18 breaths/min, Blood Pressure: 134/45 mmHg. Cardiovascular Heart rhythm and rate regular, without murmur or gallop.. Femoral arteries without bruits and pulses strong.Marland Kitchen Lymphatic No popliteal or inguinal adenopathy. General Notes: Wound exam; there is no open area here. The large discolored area on the medial aspect of her right ankle is not quite as enlarged swollen. It is however still exquisitely tender and the extent of the tender area is perhaps 2-3 cm above the area of discoloration. Superficially this looks like stasis dermatitis I just don't think that's what this is. She will complete her doxycycline I did not prescribe more. Rheumatoid arthritis can cause a vasculitis although I've never seen this looks quite like this. She does have some nontender nodules on the left medial calf. She follows with a rheumatologist at [facility] clinic Assessment Active Problems ICD-10 L97.211 -  Non-pressure chronic ulcer of right calf limited to breakdown of skin E11.622 - Type 2 diabetes mellitus with other skin ulcer E11.51 - Type 2 diabetes mellitus with diabetic peripheral angiopathy without gangrene E11.22 - Type 2 diabetes mellitus  with diabetic chronic kidney disease Plan Follow-up Appointments: Return Appointment in 1 week. Edema Control: Other: - ace wrap daily Medications-please add to medication list.: P.O. Antibiotics - continue doxycycline 100mg  PO BID for 7 days [name], [name] ([id number]) awaiting MRI. Perhaps then a biopsy. No further antibiotics for now. Might like her rheumatologist to have a look at this. I t beleive this is cellulitis Electronic Signature(s) Signed: 08/04/2015 4:53:46 PM By: [name] Entered By: [name] on 08/03/2015 08:56:52 [name], [name] ([id number]) -------------------------------------------------------------------------------- SuperBill Details Patient Name: Becky Stall L. Date of Service: 08/03/2015 Medical Record Patient Account Number: [id number] Number: Treating RN: [name] 21-Jun-1940 (74 y.o. Other Clinician: Date of Birth/Sex: Female) Treating [name] Primary Care Physician/Extender: [name] Physician: Weeks in Treatment: 3 Referring Physician: Leretha [name] Diagnosis Coding ICD-10 Codes Code Description [id number] Non-pressure chronic ulcer of right calf limited to breakdown of skin E11.622 Type 2 diabetes mellitus with other skin ulcer E11.51 Type 2 diabetes mellitus with diabetic peripheral angiopathy without gangrene E11.22 Type 2 diabetes mellitus with diabetic chronic kidney disease Facility Procedures CPT4 Code: [id number] Description: [id number] - WOUND CARE VISIT-LEV 2 EST PT Modifier: Quantity: 1 Physician Procedures CPT4 Code Description: [id number] [name] - WC PHYS LEVEL 3 - EST PT ICD-10 Description Diagnosis L97.211 Non-pressure chronic ulcer of right  calf limited to Modifier: breakdown of Quantity: 1 skin Electronic Signature(s) Signed: 08/04/2015 4:53:46 PM By: [name] Entered By: [name] on 08/03/2015 08:57:24

## 2015-08-05 NOTE — Progress Notes (Signed)
[name] ([id number]) Visit Report for 08/03/2015 Arrival Information Details Patient Name: [name], [name]. Date of Service: 08/03/2015 8:00 AM Medical Record Patient Account Number: [id number] Number: Treating RN: Montey Hora Sep 13, 1940 (75 y.o. Other Clinician: Date of Birth/Sex: Female) Treating ROBSON, [place] Primary Care Physician: Leretha Pol Physician/Extender: G Referring Physician: Aquilla Hacker in Treatment: 3 Visit Information History Since Last Visit Added or deleted any medications: No Patient Arrived: Ambulatory Any new allergies or adverse reactions: No Arrival Time: 08:14 Had a fall or experienced change in No Accompanied By: self activities of daily living that may affect Transfer Assistance: None risk of falls: Patient Identification Verified: Yes Signs or symptoms of abuse/neglect since last No Secondary Verification Process Yes visito Completed: Hospitalized since last visit: No Patient Has Alerts: Yes Pain Present Now: Yes Patient Alerts: DMII NO ABI R LEG R/T PAIN Electronic Signature(s) Signed: 08/03/2015 4:47:49 PM By: Montey Hora Entered By: Montey Hora on 08/03/2015 08:19:18 Woodring, Chavonne L. ([id number]) -------------------------------------------------------------------------------- Clinic Level of Care Assessment Details Patient Name: Becky Stall L. Date of Service: 08/03/2015 8:00 AM Medical Record Patient Account Number: [id number] Number: Treating RN: Montey Hora 1940/08/24 (75 y.o. Other Clinician: Date of Birth/Sex: Female) Treating ROBSON, [place] Primary Care Physician: Leretha Pol Physician/Extender: G Referring Physician: Aquilla Hacker in Treatment: 3 Clinic Level of Care Assessment Items TOOL 4 Quantity Score []  - Use when only an EandM is performed on FOLLOW-UP visit 0 ASSESSMENTS - Nursing Assessment / Reassessment X - Reassessment of Co-morbidities (includes  updates in patient status) 1 10 X - Reassessment of Adherence to Treatment Plan 1 5 ASSESSMENTS - Wound and Skin Assessment / Reassessment []  - Simple Wound Assessment / Reassessment - one wound 0 []  - Complex Wound Assessment / Reassessment - multiple wounds 0 X - Dermatologic / Skin Assessment (not related to wound area) 1 10 ASSESSMENTS - Focused Assessment X - Circumferential Edema Measurements - multi extremities 1 5 []  - Nutritional Assessment / Counseling / Intervention 0 X - Lower Extremity Assessment (monofilament, tuning fork, pulses) 1 5 []  - Peripheral Arterial Disease Assessment (using hand held doppler) 0 ASSESSMENTS - Ostomy and/or Continence Assessment and Care []  - Incontinence Assessment and Management 0 []  - Ostomy Care Assessment and Management (repouching, etc.) 0 PROCESS - Coordination of Care X - Simple Patient / Family Education for ongoing care 1 15 []  - Complex (extensive) Patient / Family Education for ongoing care 0 []  - Staff obtains Programmer, systems, Records, Test Results / Process Orders 0 []  - Staff telephones HHA, Nursing Homes / Clarify orders / etc 0 Rockett, Liann L. ([id number]) []  - Routine Transfer to another Facility (non-emergent condition) 0 []  - Routine Hospital Admission (non-emergent condition) 0 []  - New Admissions / Biomedical engineer / Ordering NPWT, Apligraf, etc. 0 []  - Emergency Hospital Admission (emergent condition) 0 X - Simple Discharge Coordination 1 10 []  - Complex (extensive) Discharge Coordination 0 PROCESS - Special Needs []  - Pediatric / Minor Patient Management 0 []  - Isolation Patient Management 0 []  - Hearing / Language / Visual special needs 0 []  - Assessment of Community assistance (transportation, D/C planning, etc.) 0 []  - Additional assistance / Altered mentation 0 []  - Support Surface(s) Assessment (bed, cushion, seat, etc.) 0 INTERVENTIONS - Wound Cleansing / Measurement []  - Simple Wound Cleansing - one wound  0 []  - Complex Wound Cleansing - multiple wounds 0 []  - Wound Imaging (photographs - any number of wounds) 0 []  - Wound Tracing (  instead of photographs) 0 []  - Simple Wound Measurement - one wound 0 []  - Complex Wound Measurement - multiple wounds 0 INTERVENTIONS - Wound Dressings []  - Small Wound Dressing one or multiple wounds 0 []  - Medium Wound Dressing one or multiple wounds 0 []  - Large Wound Dressing one or multiple wounds 0 []  - Application of Medications - topical 0 []  - Application of Medications - injection 0 Nading, Lynnix L. ([id number]) INTERVENTIONS - Miscellaneous []  - External ear exam 0 []  - Specimen Collection (cultures, biopsies, blood, body fluids, etc.) 0 []  - Specimen(s) / Culture(s) sent or taken to Lab for analysis 0 []  - Patient Transfer (multiple staff / Harrel Lemon Lift / Similar devices) 0 []  - Simple Staple / Suture removal (25 or less) 0 []  - Complex Staple / Suture removal (26 or more) 0 []  - Hypo / Hyperglycemic Management (close monitor of Blood Glucose) 0 []  - Ankle / Brachial Index (ABI) - do not check if billed separately 0 X - Vital Signs 1 5 Has the patient been seen at the hospital within the last three years: Yes Total Score: 65 Level Of Care: New/Established - Level 2 Electronic Signature(s) Signed: 08/03/2015 4:47:49 PM By: Montey Hora Entered By: Montey Hora on 08/03/2015 08:27:10 Garling, Shery L. ([id number]) -------------------------------------------------------------------------------- Encounter Discharge Information Details Patient Name: Becky Stall L. Date of Service: 08/03/2015 8:00 AM Medical Record Patient Account Number: [id number] Number: Treating RN: Montey Hora March 19, 1941 (75 y.o. Other Clinician: Date of Birth/Sex: Female) Treating ROBSON, [place] Primary Care Physician: Leretha Pol Physician/Extender: G Referring Physician: Aquilla Hacker in Treatment: 3 Encounter Discharge Information  Items Discharge Pain Level: 0 Discharge Condition: Stable Ambulatory Status: Ambulatory Discharge Destination: Home Transportation: Private Auto Accompanied By: self Schedule Follow-up Appointment: Yes Medication Reconciliation completed and provided to Patient/Care No Keedan Sample: Provided on Clinical Summary of Care: 08/03/2015 Form Type Recipient Paper Patient MB Electronic Signature(s) Signed: 08/03/2015 8:41:01 AM By: Ruthine Dose Entered By: Ruthine Dose on 08/03/2015 08:41:01 Frost, [place]. ([id number]) -------------------------------------------------------------------------------- Lower Extremity Assessment Details Patient Name: Boyko, Adryanna L. Date of Service: 08/03/2015 8:00 AM Medical Record Patient Account Number: [id number] Number: Treating RN: Montey Hora Nov 23, 1940 (74 y.o. Other Clinician: Date of Birth/Sex: Female) Treating ROBSON, [place] Primary Care Physician: Leretha Pol Physician/Extender: G Referring Physician: Leretha Pol Weeks in Treatment: 3 Edema Assessment Assessed: [Left: No] [Right: No] E[Left: dema] [Right: :] Calf Left: Right: Point of Measurement: 30 cm From Medial Instep cm 34.3 cm Ankle Left: Right: Point of Measurement: 10 cm From Medial Instep cm 20.5 cm Vascular Assessment Pulses: Posterior Tibial Dorsalis Pedis Palpable: [Right:Yes] Extremity colors, hair growth, and conditions: Extremity Color: [Right:Hyperpigmented] Hair Growth on Extremity: [Right:No] Temperature of Extremity: [Right:Warm] Capillary Refill: [Right:< 3 seconds] Electronic Signature(s) Signed: 08/03/2015 4:47:49 PM By: Montey Hora Entered By: Montey Hora on 08/03/2015 08:20:55 Muhammed, Hudsyn L. ([id number]) -------------------------------------------------------------------------------- Multi Wound Chart Details Patient Name: Mcnamara, Joreen L. Date of Service: 08/03/2015 8:00 AM Medical Record Patient Account Number:  [id number] Number: Treating RN: Montey Hora [birth year] (74 y.o. Other Clinician: Date of Birth/Sex: Female) Treating ROBSON, [place] Primary Care Physician: Leretha Pol Physician/Extender: G Referring Physician: Leretha Pol Weeks in Treatment: 3 Vital Signs Height(in): 62 Pulse(bpm): 53 Weight(lbs): 151 Blood Pressure 134/45 (mmHg): Body Mass Index(BMI): 28 Temperature(F): 98.3 Respiratory Rate 18 (breaths/min): Wound Assessments Treatment Notes Electronic Signature(s) Signed: 08/03/2015 4:47:49 PM By: Montey Hora Entered By: Montey Hora on 08/03/2015 08:26:43 Soldo, Rollen Sox ([id number]) -------------------------------------------------------------------------------- [place] Details Patient  Name: Norman, Kasidi L. Date of Service: 08/03/2015 8:00 AM Medical Record Patient Account Number: [id number] Number: Treating RN: Montey Hora 10/04/1940 (74 y.o. Other Clinician: Date of Birth/Sex: Female) Treating ROBSON, [place] Primary Care Physician: Leretha Pol Physician/Extender: G Referring Physician: Aquilla Hacker in Treatment: 3 Active Inactive Orientation to the Wound Care Program Nursing Diagnoses: Knowledge deficit related to the wound healing center program Goals: Patient/caregiver will verbalize understanding of the [place] Program Date Initiated: 07/13/2015 Goal Status: Active Interventions: Provide education on orientation to the wound center Notes: Electronic Signature(s) Signed: 08/03/2015 4:47:49 PM By: Montey Hora Entered By: Montey Hora on 08/03/2015 08:26:29 Tamez, Sheresa L. ([id number]) -------------------------------------------------------------------------------- Pain Assessment Details Patient Name: Wubben, Mykelti L. Date of Service: 08/03/2015 8:00 AM Medical Record Patient Account Number: [id number] Number: Treating RN: Montey Hora [birth year]  (74 y.o. Other Clinician: Date of Birth/Sex: Female) Treating ROBSON, [place] Primary Care Physician: Leretha Pol Physician/Extender: G Referring Physician: Aquilla Hacker in Treatment: 3 Active Problems Location of Pain Severity and Description of Pain Patient Has Paino Yes Site Locations Pain Location: Generalized Pain Duration of the Pain. Constant / Intermittento Constant Pain Management and Medication Current Pain Management: Electronic Signature(s) Signed: 08/03/2015 4:47:49 PM By: Montey Hora Entered By: Montey Hora on 08/03/2015 08:19:34 Ventresca, Chanice L. ([id number]) -------------------------------------------------------------------------------- Patient/Caregiver Education Details Patient Name: Becky Stall L. Date of Service: 08/03/2015 8:00 AM Medical Record Patient Account Number: [id number] Number: Treating RN: Montey Hora [birth year] (74 y.o. Other Clinician: Date of Birth/Gender: Female) Treating ROBSON, [place] Primary Care Physician: Leretha Pol Physician/Extender: G Referring Physician: Aquilla Hacker in Treatment: 3 Education Assessment Education Provided To: Patient Education Topics Provided Basic Hygiene: Handouts: Other: lotion to leg as needed Methods: Explain/Verbal Responses: State content correctly Electronic Signature(s) Signed: 08/03/2015 4:47:49 PM By: Montey Hora Entered By: Montey Hora on 08/03/2015 08:37:18 Sias, Aizley L. ([id number]) -------------------------------------------------------------------------------- Vitals Details Patient Name: Becky Stall L. Date of Service: 08/03/2015 8:00 AM Medical Record Patient Account Number: [id number] Number: Treating RN: Montey Hora 08-28-1940 (74 y.o. Other Clinician: Date of Birth/Sex: Female) Treating ROBSON, [place] Primary Care Physician: Leretha Pol Physician/Extender: G Referring Physician: Leretha Pol Weeks in  Treatment: 3 Vital Signs Time Taken: 08:21 Temperature (F): 98.3 Height (in): 62 Pulse (bpm): 53 Weight (lbs): 151 Respiratory Rate (breaths/min): 18 Body Mass Index (BMI): 27.6 Blood Pressure (mmHg): 134/45 Reference Range: 80 - 120 mg / dl Electronic Signature(s) Signed: 08/03/2015 4:47:49 PM By: Montey Hora Entered By: Montey Hora on 08/03/2015 08:23:34

## 2015-08-10 ENCOUNTER — Encounter: Payer: Medicare HMO | Admitting: Internal Medicine

## 2015-08-10 DIAGNOSIS — Z992 Dependence on renal dialysis: Secondary | ICD-10-CM | POA: Diagnosis not present

## 2015-08-10 DIAGNOSIS — E11622 Type 2 diabetes mellitus with other skin ulcer: Secondary | ICD-10-CM | POA: Diagnosis not present

## 2015-08-10 DIAGNOSIS — I129 Hypertensive chronic kidney disease with stage 1 through stage 4 chronic kidney disease, or unspecified chronic kidney disease: Secondary | ICD-10-CM | POA: Diagnosis not present

## 2015-08-10 DIAGNOSIS — L97211 Non-pressure chronic ulcer of right calf limited to breakdown of skin: Secondary | ICD-10-CM | POA: Diagnosis not present

## 2015-08-10 DIAGNOSIS — L989 Disorder of the skin and subcutaneous tissue, unspecified: Secondary | ICD-10-CM | POA: Diagnosis not present

## 2015-08-10 DIAGNOSIS — N182 Chronic kidney disease, stage 2 (mild): Secondary | ICD-10-CM | POA: Diagnosis not present

## 2015-08-10 DIAGNOSIS — E1122 Type 2 diabetes mellitus with diabetic chronic kidney disease: Secondary | ICD-10-CM | POA: Diagnosis not present

## 2015-08-10 DIAGNOSIS — E1151 Type 2 diabetes mellitus with diabetic peripheral angiopathy without gangrene: Secondary | ICD-10-CM | POA: Diagnosis not present

## 2015-08-10 DIAGNOSIS — E11628 Type 2 diabetes mellitus with other skin complications: Secondary | ICD-10-CM | POA: Diagnosis not present

## 2015-08-11 NOTE — Progress Notes (Signed)
ALEXSA, SPRANGER ([id number]) Visit Report for 08/10/2015 Arrival Information Details Patient Name: [name], [name]. Date of Service: 08/10/2015 8:00 AM Medical Record Patient Account Number: [id number] Number: Treating RN: Montey Hora 09-May-1941 (75 y.o. Other Clinician: Date of Birth/Sex: Female) Treating ROBSON, [place] Primary Care Physician: Leretha Pol Physician/Extender: G Referring Physician: Aquilla Hacker in Treatment: 4 Visit Information History Since Last Visit Added or deleted any medications: No Patient Arrived: Ambulatory Any new allergies or adverse reactions: No Arrival Time: 08:10 Had a fall or experienced change in No Accompanied By: self activities of daily living that may affect Transfer Assistance: None risk of falls: Patient Identification Verified: Yes Signs or symptoms of abuse/neglect since last No Secondary Verification Process Yes visito Completed: Hospitalized since last visit: No Patient Has Alerts: Yes Pain Present Now: No Patient Alerts: DMII NO ABI R LEG R/T PAIN Electronic Signature(s) Signed: 08/10/2015 4:51:52 PM By: Montey Hora Entered By: Montey Hora on 08/10/2015 08:13:09 [name], [name] ([id number]) -------------------------------------------------------------------------------- Clinic Level of Care Assessment Details Patient Name: Becky Stall L. Date of Service: 08/10/2015 8:00 AM Medical Record Patient Account Number: [id number] Number: Treating RN: Montey Hora 1941-05-04 (75 y.o. Other Clinician: Date of Birth/Sex: Female) Treating ROBSON, [place] Primary Care Physician: Leretha Pol Physician/Extender: G Referring Physician: Aquilla Hacker in Treatment: 4 Clinic Level of Care Assessment Items TOOL 4 Quantity Score []  - Use when only an EandM is performed on FOLLOW-UP visit 0 ASSESSMENTS - Nursing Assessment / Reassessment X - Reassessment of Co-morbidities  (includes updates in patient status) 1 10 X - Reassessment of Adherence to Treatment Plan 1 5 ASSESSMENTS - Wound and Skin Assessment / Reassessment []  - Simple Wound Assessment / Reassessment - one wound 0 []  - Complex Wound Assessment / Reassessment - multiple wounds 0 X - Dermatologic / Skin Assessment (not related to wound area) 1 10 ASSESSMENTS - Focused Assessment X - Circumferential Edema Measurements - multi extremities 1 5 []  - Nutritional Assessment / Counseling / Intervention 0 X - Lower Extremity Assessment (monofilament, tuning fork, pulses) 1 5 []  - Peripheral Arterial Disease Assessment (using hand held doppler) 0 ASSESSMENTS - Ostomy and/or Continence Assessment and Care []  - Incontinence Assessment and Management 0 []  - Ostomy Care Assessment and Management (repouching, etc.) 0 PROCESS - Coordination of Care X - Simple Patient / Family Education for ongoing care 1 15 []  - Complex (extensive) Patient / Family Education for ongoing care 0 []  - Staff obtains Programmer, systems, Records, Test Results / Process Orders 0 []  - Staff telephones HHA, Nursing Homes / Clarify orders / etc 0 Sanfilippo, Shandora L. ([id number]) []  - Routine Transfer to another Facility (non-emergent condition) 0 []  - Routine Hospital Admission (non-emergent condition) 0 []  - New Admissions / Biomedical engineer / Ordering NPWT, Apligraf, etc. 0 []  - Emergency Hospital Admission (emergent condition) 0 X - Simple Discharge Coordination 1 10 []  - Complex (extensive) Discharge Coordination 0 PROCESS - Special Needs []  - Pediatric / Minor Patient Management 0 []  - Isolation Patient Management 0 []  - Hearing / Language / Visual special needs 0 []  - Assessment of Community assistance (transportation, D/C planning, etc.) 0 []  - Additional assistance / Altered mentation 0 []  - Support Surface(s) Assessment (bed, cushion, seat, etc.) 0 INTERVENTIONS - Wound Cleansing / Measurement []  - Simple Wound Cleansing - one  wound 0 []  - Complex Wound Cleansing - multiple wounds 0 []  - Wound Imaging (photographs - any number of wounds) 0 []  - Wound Tracing (  instead of photographs) 0 []  - Simple Wound Measurement - one wound 0 []  - Complex Wound Measurement - multiple wounds 0 INTERVENTIONS - Wound Dressings []  - Small Wound Dressing one or multiple wounds 0 []  - Medium Wound Dressing one or multiple wounds 0 []  - Large Wound Dressing one or multiple wounds 0 []  - Application of Medications - topical 0 []  - Application of Medications - injection 0 [name], [name] ([id number]) INTERVENTIONS - Miscellaneous []  - External ear exam 0 []  - Specimen Collection (cultures, biopsies, blood, body fluids, etc.) 0 []  - Specimen(s) / Culture(s) sent or taken to Lab for analysis 0 []  - Patient Transfer (multiple staff / Harrel Lemon Lift / Similar devices) 0 []  - Simple Staple / Suture removal (25 or less) 0 []  - Complex Staple / Suture removal (26 or more) 0 []  - Hypo / Hyperglycemic Management (close monitor of Blood Glucose) 0 []  - Ankle / Brachial Index (ABI) - do not check if billed separately 0 X - Vital Signs 1 5 Has the patient been seen at the hospital within the last three years: Yes Total Score: 65 Level Of Care: New/Established - Level 2 Electronic Signature(s) Signed: 08/10/2015 4:51:52 PM By: Montey Hora Entered By: Montey Hora on 08/10/2015 08:20:35 [name], [name] ([id number]) -------------------------------------------------------------------------------- Encounter Discharge Information Details Patient Name: Becky Stall L. Date of Service: 08/10/2015 8:00 AM Medical Record Patient Account Number: [id number] Number: Treating RN: Montey Hora 1940/06/26 (75 y.o. Other Clinician: Date of Birth/Sex: Female) Treating ROBSON, [place] Primary Care Physician: Leretha Pol Physician/Extender: G Referring Physician: Aquilla Hacker in Treatment: 4 Encounter Discharge  Information Items Discharge Pain Level: 0 Discharge Condition: Stable Ambulatory Status: Ambulatory Discharge Destination: Home Private Transportation: Auto Accompanied By: self Schedule Follow-up Appointment: Yes Medication Reconciliation completed and No provided to Patient/Care Timberlynn Kizziah: Clinical Summary of Care: Electronic Signature(s) Signed: 08/10/2015 4:51:52 PM By: Montey Hora Entered By: Montey Hora on 08/10/2015 08:20:52 [name], [name] ([id number]) -------------------------------------------------------------------------------- Lower Extremity Assessment Details Patient Name: [name], [name] Date of Service: 08/10/2015 8:00 AM Medical Record Patient Account Number: [id number] Number: Treating RN: Montey Hora 1940/09/27 (74 y.o. Other Clinician: Date of Birth/Sex: Female) Treating ROBSON, [place] Primary Care Physician: Leretha Pol Physician/Extender: G Referring Physician: Leretha Pol Weeks in Treatment: 4 Edema Assessment Assessed: [Left: No] [Right: No] E[Left: dema] [Right: :] Calf Left: Right: Point of Measurement: 30 cm From Medial Instep cm 34 cm Ankle Left: Right: Point of Measurement: 10 cm From Medial Instep cm 20.5 cm Vascular Assessment Pulses: Posterior Tibial Palpable: [Right:Yes] Dorsalis Pedis Palpable: [Right:Yes] Extremity colors, hair growth, and conditions: Extremity Color: [Right:Hyperpigmented] Hair Growth on Extremity: [Right:No] Temperature of Extremity: [Right:Warm] Capillary Refill: [Right:< 3 seconds] Electronic Signature(s) Signed: 08/10/2015 4:51:52 PM By: Montey Hora Entered By: Montey Hora on 08/10/2015 08:19:15 [name], [name] ([id number]) -------------------------------------------------------------------------------- Multi Wound Chart Details Patient Name: [name], [name] Date of Service: 08/10/2015 8:00 AM Medical Record Patient Account Number:  [id number] Number: Treating RN: Montey Hora December 10, 1940 (74 y.o. Other Clinician: Date of Birth/Sex: Female) Treating ROBSON, [place] Primary Care Physician: Leretha Pol Physician/Extender: G Referring Physician: Leretha Pol Weeks in Treatment: 4 Vital Signs Height(in): 62 Pulse(bpm): 60 Weight(lbs): 151 Blood Pressure 160/69 (mmHg): Body Mass Index(BMI): 28 Temperature(F): 98.1 Respiratory Rate 18 (breaths/min): Wound Assessments Treatment Notes Electronic Signature(s) Signed: 08/10/2015 4:51:52 PM By: Montey Hora Entered By: Montey Hora on 08/10/2015 08:19:38 [name], [name] ([id number]) -------------------------------------------------------------------------------- [place] Details Patient Name: Becky Stall L. Date of Service:  08/10/2015 8:00 AM Medical Record Patient Account Number: [id number] Number: Treating RN: Montey Hora 1940-08-26 (74 y.o. Other Clinician: Date of Birth/Sex: Female) Treating ROBSON, [place] Primary Care Physician: Leretha Pol Physician/Extender: G Referring Physician: Aquilla Hacker in Treatment: 4 Active Inactive Orientation to the Wound Care Program Nursing Diagnoses: Knowledge deficit related to the wound healing center program Goals: Patient/caregiver will verbalize understanding of the [place] Program Date Initiated: 07/13/2015 Goal Status: Active Interventions: Provide education on orientation to the wound center Notes: Electronic Signature(s) Signed: 08/10/2015 4:51:52 PM By: Montey Hora Entered By: Montey Hora on 08/10/2015 08:19:32 [name], [name] ([id number]) -------------------------------------------------------------------------------- Patient/Caregiver Education Details Patient Name: Becky Stall L. Date of Service: 08/10/2015 8:00 AM Medical Record Patient Account Number: [id number] Number: Treating RN: Montey Hora 27-Feb-1941 (74 y.o. Other Clinician: Date of Birth/Gender: Female) Treating ROBSON, [place] Primary Care Physician: Leretha Pol Physician/Extender: G Referring Physician: Aquilla Hacker in Treatment: 4 Education Assessment Education Provided To: Patient Education Topics Provided Basic Hygiene: Handouts: Other: lotion to both lower legs daily Methods: Demonstration, Explain/Verbal Responses: State content correctly Electronic Signature(s) Signed: 08/10/2015 4:51:52 PM By: Montey Hora Entered By: Montey Hora on 08/10/2015 08:21:23 [name], [name] ([id number]) -------------------------------------------------------------------------------- Vitals Details Patient Name: Becky Stall L. Date of Service: 08/10/2015 8:00 AM Medical Record Patient Account Number: [id number] Number: Treating RN: Montey Hora July 03, 1940 (74 y.o. Other Clinician: Date of Birth/Sex: Female) Treating ROBSON, [place] Primary Care Physician: Leretha Pol Physician/Extender: G Referring Physician: Leretha Pol Weeks in Treatment: 4 Vital Signs Time Taken: 08:14 Temperature (F): 98.1 Height (in): 62 Pulse (bpm): 60 Weight (lbs): 151 Respiratory Rate (breaths/min): 18 Body Mass Index (BMI): 27.6 Blood Pressure (mmHg): 160/69 Reference Range: 80 - 120 mg / dl Electronic Signature(s) Signed: 08/10/2015 4:51:52 PM By: Montey Hora Entered By: Montey Hora on 08/10/2015 08:14:20

## 2015-08-11 NOTE — Progress Notes (Signed)
CAYA, HOFMANN ([id number]) Visit Report for 08/10/2015 Chief Complaint Document Details Patient Name: [name], [name]. Date of Service: 08/10/2015 8:00 AM Medical Record Patient Account Number: [id number] Number: Treating RN: Montey Hora [birth year] (75 y.o. Other Clinician: Date of Birth/Sex: Female) Treating ROBSON, MICHAEL Primary Care Physician/Extender: Irene Limbo Physician: Referring Physician: Aquilla Hacker in Treatment: 4 Information Obtained from: Patient Chief Complaint Patient is referred here for erythema and swelling on the medial right leg. There is actually not a wound Electronic Signature(s) Signed: 08/11/2015 8:13:25 AM By: Linton Ham MD Entered By: Linton Ham on 08/10/2015 08:59:56 Ramakrishnan, Starkeisha L. ([id number]) -------------------------------------------------------------------------------- HPI Details Patient Name: Becky Stall L. Date of Service: 08/10/2015 8:00 AM Medical Record Patient Account Number: [id number] Number: Treating RN: Montey Hora June 08, 1940 (75 y.o. Other Clinician: Date of Birth/Sex: Female) Treating ROBSON, MICHAEL Primary Care Physician/Extender: Irene Limbo Physician: Referring Physician: Leretha Pol Weeks in Treatment: 4 History of Present Illness HPI Description: 07/13/15; this is a patient who was sent here for review of an area on her right medial leg. She was seen by her primary care doctor last week and started on Keflex. Venous studies did not show DVT or evidence of reflux. She does not actually have an open wound. She tells me that roughly 3 weeks ago she developed some erythema and swelling on the medial right leg making it look different from her left leg. This has progressed and become very tender. She has not been systemically unwell. She is a diabetic. She is also recently treated for herpes zoster over her left anterior lower thigh. She has chronic  renal insufficiency and is listed as having PAD. Her ABI in this clinic was 1.23 on the left. Could not be obtained on the right due to pain. She was also sent for a duplex ultrasound of the right leg that did not show evidence of a DVT or reflux 07/20/15. The patient returns having completed her Keflex. She had a venous duplex that did not show DVT or venous reflux. Once again she returns with a inflamed, warm, hard area on the medial aspect of her right leg. This is exquisitely painful. It is not worse than last week but certainly no better. I have marked the edges of this. She does not have arterial insufficiency with palpable pulses and an ABI of 1.23. I once again reviewed the history here, the patient absolutely denies trauma fever chills. 07/27/15; the patient's area on the medial aspect of her right leg appears to be less swollen but it is still warm and and exquisitely tender. She didn't start doxycycline last week I will continue that at this week although I'm not the least bit sure that this is cellulitis. She had an x-ray done of her leg that was negative [insurance insistence as a prelude to an [place]. The patient has simply been placed wrapping this area 08/03/15; the area continues to be less swollen but still warm and exquisitely tender. She has a history of PAD but I don't think this is a primary issue. She has a history of diabetes and per the patient rheumatoid arthritis she takes methotrexate and prednisone. Her MRI is not until march 17 Electronic Signature(s) Signed: 08/11/2015 8:13:25 AM By: Linton Ham MD Entered By: Linton Ham on 08/10/2015 09:00:28 Patch, Rollen Sox ([id number]) -------------------------------------------------------------------------------- Physical Exam Details Patient Name: [name], [name] Date of Service: 08/10/2015 8:00 AM Medical Record Patient Account Number: [id number] Number: Treating RN: Montey Hora [birth year] [phone] 75 y.o.  Other Clinician: Date of Birth/Sex: Female) Treating ROBSON, MICHAEL Primary Care Physician/Extender: Irene Limbo Physician: Referring Physician: Leretha Pol Weeks in Treatment: 4 Constitutional Supine Blood Pressure is within target range for patient.. Pulse regular and within target range for patient.Marland Kitchen Respirations regular, non-labored and within target range.. Patient's appearance is neat and clean. Appears in no acute distress. Well nourished and well developed.. Cardiovascular Pedal pulses palpable and strong bilaterally.. Lymphatic Nonpalpable in the popliteal or inguinal areas.. Musculoskeletal There is no involvement of the right ankle. No effusion. Notes Wound exam; there is no open area here. A large discolored area on the medial aspect of her right ankle. Most of the time looking at this I would say this is purely venous insufficiency however this is firm, exquisitely tender and involves area superior to the area of discoloration. There is really no evidence of arterial insufficiency. I have wanted an MRI of the leg to delineate the underlying muscle and tissue before I decide about going on with the biopsy Electronic Signature(s) Signed: 08/11/2015 8:13:25 AM By: Linton Ham MD Entered By: Linton Ham on 08/10/2015 09:02:58 [place]ara, Rollen Sox ([id number]) -------------------------------------------------------------------------------- Physician Orders Details Patient Name: [name], [name] Date of Service: 08/10/2015 8:00 AM Medical Record Patient Account Number: [id number] Number: Treating RN: Montey Hora [birth year] (75 y.o. Other Clinician: Date of Birth/Sex: Female) Treating ROBSON, MICHAEL Primary Care Physician/Extender: Irene Limbo Physician: Referring Physician: Aquilla Hacker in Treatment: 4 Verbal / Phone Orders: Yes Clinician: Montey Hora Read Back and Verified: Yes Diagnosis Coding Follow-up  Appointments o Return Appointment in 1 week. Edema Control o Other: - ace wrap daily Electronic Signature(s) Signed: 08/10/2015 4:51:52 PM By: Montey Hora Signed: 08/11/2015 8:13:25 AM By: Linton Ham MD Entered By: Montey Hora on 08/10/2015 08:20:01 Lothamer, Tawyna L. ([id number]) -------------------------------------------------------------------------------- Problem List Details Patient Name: Titus, Irmalee L. Date of Service: 08/10/2015 8:00 AM Medical Record Patient Account Number: [id number] Number: Treating RN: Montey Hora [birth year] (74 y.o. 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 ([id number]) -------------------------------------------------------------------------------- Progress Note Details Patient Name: Nowack, Blimy L. Date of Service: 08/10/2015 8:00 AM Medical Record Patient Account Number: [id number] Number: Treating RN: Montey Hora 1940-09-19 (74 y.o. Other Clinician: Date of Birth/Sex: Female) Treating ROBSON, MICHAEL Primary Care Physician/Extender: Irene Limbo Physician: Referring Physician: Aquilla Hacker in Treatment: 4 Subjective Chief Complaint Information  obtained from Patient Patient is referred here for erythema and swelling on the medial right leg. There is actually not a wound History of Present Illness (HPI) 07/13/15; this is a patient who was sent here for review of an area on her right medial leg. She was seen by her primary care doctor last week and started on Keflex. Venous studies did not show DVT or evidence of reflux. She does not actually have an open wound. She tells me that roughly 3 weeks ago she developed some erythema and swelling  on the medial right leg making it look different from her left leg. This has progressed and become very tender. She has not been systemically unwell. She is a diabetic. She is also recently treated for herpes zoster over her left anterior lower thigh. She has chronic renal insufficiency and is listed as having PAD. Her ABI in this clinic was 1.23 on the left. Could not be obtained on the right due to pain. She was also sent for a duplex ultrasound of the right leg that did not show evidence of a DVT or reflux 07/20/15. The patient returns having completed her Keflex. She had a venous duplex that did not show DVT or venous reflux. Once again she returns with a inflamed, warm, hard area on the medial aspect of her right leg. This is exquisitely painful. It is not worse than last week but certainly no better. I have marked the edges of this. She does not have arterial insufficiency with palpable pulses and an ABI of 1.23. I once again reviewed the history here, the patient absolutely denies trauma fever chills. 07/27/15; the patient's area on the medial aspect of her right leg appears to be less swollen but it is still warm and and exquisitely tender. She didn't start doxycycline last week I will continue that at this week although I'm not the least bit sure that this is cellulitis. She had an x-ray done of her leg that was negative [insurance insistence as a prelude to an [place]. The patient has simply been  placed wrapping this area 08/03/15; the area continues to be less swollen but still warm and exquisitely tender. She has a history of PAD but I don't think this is a primary issue. She has a history of diabetes and per the patient rheumatoid arthritis she takes methotrexate and prednisone. Her MRI is not until march 17 Objective Constitutional Meckler, [place] ([id number]) Supine Blood Pressure is within target range for patient.. Pulse regular and within target range for patient.Marland Kitchen Respirations regular, non-labored and within target range.. Patient's appearance is neat and clean. Appears in no acute distress. Well nourished and well developed.. Vitals Time Taken: 8:14 AM, Height: 62 in, Weight: 151 lbs, BMI: 27.6, Temperature: 98.1 F, Pulse: 60 bpm, Respiratory Rate: 18 breaths/min, Blood Pressure: 160/69 mmHg. Cardiovascular Pedal pulses palpable and strong bilaterally.. Lymphatic Nonpalpable in the popliteal or inguinal areas.. Musculoskeletal There is no involvement of the right ankle. No effusion. General Notes: Wound exam; there is no open area here. A large discolored area on the medial aspect of her right ankle. Most of the time looking at this I would say this is purely venous insufficiency however this is firm, exquisitely tender and involves area superior to the area of discoloration. There is really no evidence of arterial insufficiency. I have wanted an MRI of the leg to delineate the underlying muscle and tissue before I decide about going on with the biopsy Assessment Active Problems ICD-10 L97.211 - Non-pressure chronic ulcer of right calf limited to breakdown of skin E11.622 - Type 2 diabetes mellitus with other skin ulcer E11.51 - Type 2 diabetes mellitus with diabetic peripheral angiopathy without gangrene E11.22 - Type 2 diabetes mellitus with diabetic chronic kidney disease Plan Follow-up Appointments: Return Appointment in 1 week. Edema Control: Other: - ace  wrap daily Heuer, Loanne L. ([id number]) #1 no change to the treatment to the area. #2 MRI is on March 17 #3 after the MRI will decide on further investigations up to and including a  punch biopsy #4 the patient asked me about antibiotics, I don't think any further antibiotics are necessary at this time Electronic Signature(s) Signed: 08/11/2015 8:13:25 AM By: Linton Ham MD Entered By: Linton Ham on 08/10/2015 09:03:55 Nesmith, Kazzandra L. ([id number]) -------------------------------------------------------------------------------- SuperBill Details Patient Name: Pepperman, Rhema L. Date of Service: 08/10/2015 Medical Record Patient Account Number: [id number] Number: Treating RN: Montey Hora [birth year] (74 y.o. Other Clinician: Date of Birth/Sex: Female) Treating ROBSON, MICHAEL Primary Care Physician/Extender: Irene Limbo Physician: Weeks in Treatment: 4 Referring Physician: Leretha Pol Diagnosis Coding ICD-10 Codes Code Description [id number] Non-pressure chronic ulcer of right calf limited to breakdown of skin E11.622 Type 2 diabetes mellitus with other skin ulcer E11.51 Type 2 diabetes mellitus with diabetic peripheral angiopathy without gangrene E11.22 Type 2 diabetes mellitus with diabetic chronic kidney disease Facility Procedures CPT4 Code: [id number] Description: [id number] - WOUND CARE VISIT-LEV 2 EST PT Modifier: Quantity: 1 Physician Procedures CPT4 Code Description: [id number] [place] - WC PHYS LEVEL 3 - EST PT ICD-10 Description Diagnosis L97.211 Non-pressure chronic ulcer of right calf limited to Modifier: breakdown of Quantity: 1 skin Electronic Signature(s) Signed: 08/11/2015 8:13:25 AM By: Linton Ham MD Entered By: Linton Ham on 08/10/2015 09:04:25

## 2015-08-12 DIAGNOSIS — R69 Illness, unspecified: Secondary | ICD-10-CM | POA: Diagnosis not present

## 2015-08-13 ENCOUNTER — Ambulatory Visit
Admission: RE | Admit: 2015-08-13 | Discharge: 2015-08-13 | Disposition: A | Discharge status: Home / Self Care | Payer: Medicare HMO | Source: Ambulatory Visit | Attending: Internal Medicine | Admitting: Internal Medicine

## 2015-08-13 DIAGNOSIS — E11622 Type 2 diabetes mellitus with other skin ulcer: Secondary | ICD-10-CM

## 2015-08-13 DIAGNOSIS — L97211 Non-pressure chronic ulcer of right calf limited to breakdown of skin: Secondary | ICD-10-CM

## 2015-08-13 DIAGNOSIS — R600 Localized edema: Secondary | ICD-10-CM | POA: Diagnosis not present

## 2015-08-13 MED ORDER — GADOBENATE DIMEGLUMINE 529 MG/ML IV SOLN
15.0000 mL | Freq: Once | INTRAVENOUS | Status: AC | PRN
  Administered 2015-08-13: 14 mL via INTRAVENOUS

## 2015-08-17 ENCOUNTER — Encounter: Payer: Medicare HMO | Admitting: Internal Medicine

## 2015-08-17 DIAGNOSIS — R600 Localized edema: Secondary | ICD-10-CM | POA: Diagnosis not present

## 2015-08-17 DIAGNOSIS — E1151 Type 2 diabetes mellitus with diabetic peripheral angiopathy without gangrene: Secondary | ICD-10-CM | POA: Diagnosis not present

## 2015-08-17 DIAGNOSIS — E11622 Type 2 diabetes mellitus with other skin ulcer: Secondary | ICD-10-CM | POA: Diagnosis not present

## 2015-08-17 DIAGNOSIS — I129 Hypertensive chronic kidney disease with stage 1 through stage 4 chronic kidney disease, or unspecified chronic kidney disease: Secondary | ICD-10-CM | POA: Diagnosis not present

## 2015-08-17 DIAGNOSIS — E1122 Type 2 diabetes mellitus with diabetic chronic kidney disease: Secondary | ICD-10-CM | POA: Diagnosis not present

## 2015-08-17 DIAGNOSIS — E11628 Type 2 diabetes mellitus with other skin complications: Secondary | ICD-10-CM | POA: Diagnosis not present

## 2015-08-17 DIAGNOSIS — N182 Chronic kidney disease, stage 2 (mild): Secondary | ICD-10-CM | POA: Diagnosis not present

## 2015-08-17 DIAGNOSIS — L97211 Non-pressure chronic ulcer of right calf limited to breakdown of skin: Secondary | ICD-10-CM | POA: Diagnosis not present

## 2015-08-17 DIAGNOSIS — Z992 Dependence on renal dialysis: Secondary | ICD-10-CM | POA: Diagnosis not present

## 2015-08-18 NOTE — Progress Notes (Addendum)
[name], [name] ([id number]) Visit Report for 08/17/2015 Arrival Information Details Patient Name: NACHOLE, HEBRON. Date of Service: 08/17/2015 8:00 AM Medical Record Patient Account Number: [id number] Number: Treating RN: Montey Hora August 01, 1940 (74 y.o. Other Clinician: Date of Birth/Sex: Female) Treating ROBSON, MICHAEL Primary Care Physician/Extender: Irene Limbo Physician: Referring Physician: Aquilla Hacker in Treatment: 5 Visit Information History Since Last Visit Added or deleted any medications: No Patient Arrived: Ambulatory Any new allergies or adverse reactions: No Arrival Time: 08:06 Had a fall or experienced change in No Accompanied By: self activities of daily living that may affect Transfer Assistance: None risk of falls: Patient Identification Verified: Yes Signs or symptoms of abuse/neglect since last No Secondary Verification Yes visito Process Completed: Hospitalized since last visit: No Patient Has Alerts: Yes Pain Present Now: Yes Patient Alerts: DMII NO ABI R LEG R/T PAIN Electronic Signature(s) Signed: 08/17/2015 12:29:49 PM By: Montey Hora Entered By: Montey Hora on 08/17/2015 08:07:42 [name], [name] ([id number]) -------------------------------------------------------------------------------- Clinic Level of Care Assessment Details Patient Name: Becky Stall L. Date of Service: 08/17/2015 8:00 AM Medical Record Patient Account Number: [id number] Number: Treating RN: Montey Hora 1941-05-15 (74 y.o. Other Clinician: Date of Birth/Sex: Female) Treating ROBSON, MICHAEL Primary Care Physician/Extender: Lester [place], [place] Physician: Referring Physician: Aquilla Hacker in Treatment: 5 Clinic Level of Care Assessment Items TOOL 4 Quantity Score []  - Use when only an EandM is performed on FOLLOW-UP visit 0 ASSESSMENTS - Nursing Assessment / Reassessment X - Reassessment of Co-morbidities  (includes updates in patient status) 1 10 X - Reassessment of Adherence to Treatment Plan 1 5 ASSESSMENTS - Wound and Skin Assessment / Reassessment []  - Simple Wound Assessment / Reassessment - one wound 0 []  - Complex Wound Assessment / Reassessment - multiple wounds 0 X - Dermatologic / Skin Assessment (not related to wound area) 1 10 ASSESSMENTS - Focused Assessment X - Circumferential Edema Measurements - multi extremities 1 5 []  - Nutritional Assessment / Counseling / Intervention 0 X - Lower Extremity Assessment (monofilament, tuning fork, pulses) 1 5 []  - Peripheral Arterial Disease Assessment (using hand held doppler) 0 ASSESSMENTS - Ostomy and/or Continence Assessment and Care []  - Incontinence Assessment and Management 0 []  - Ostomy Care Assessment and Management (repouching, etc.) 0 PROCESS - Coordination of Care X - Simple Patient / Family Education for ongoing care 1 15 []  - Complex (extensive) Patient / Family Education for ongoing care 0 []  - Staff obtains Consents, Records, Test Results / Process Orders 0 Martorano, [place]. ([id number]) []  - Staff telephones HHA, Nursing Homes / Clarify orders / etc 0 []  - Routine Transfer to another Facility (non-emergent condition) 0 []  - Routine Hospital Admission (non-emergent condition) 0 []  - New Admissions / Biomedical engineer / Ordering NPWT, Apligraf, etc. 0 []  - Emergency Hospital Admission (emergent condition) 0 X - Simple Discharge Coordination 1 10 []  - Complex (extensive) Discharge Coordination 0 PROCESS - Special Needs []  - Pediatric / Minor Patient Management 0 []  - Isolation Patient Management 0 []  - Hearing / Language / Visual special needs 0 []  - Assessment of Community assistance (transportation, D/C planning, etc.) 0 []  - Additional assistance / Altered mentation 0 []  - Support Surface(s) Assessment (bed, cushion, seat, etc.) 0 INTERVENTIONS - Wound Cleansing / Measurement []  - Simple Wound Cleansing - one  wound 0 []  - Complex Wound Cleansing - multiple wounds 0 []  - Wound Imaging (photographs - any number of wounds) 0 []  - Wound Tracing (  instead of photographs) 0 []  - Simple Wound Measurement - one wound 0 []  - Complex Wound Measurement - multiple wounds 0 INTERVENTIONS - Wound Dressings []  - Small Wound Dressing one or multiple wounds 0 []  - Medium Wound Dressing one or multiple wounds 0 []  - Large Wound Dressing one or multiple wounds 0 []  - Application of Medications - topical 0 []  - Application of Medications - injection 0 [name], [name] ([id number]) INTERVENTIONS - Miscellaneous []  - External ear exam 0 []  - Specimen Collection (cultures, biopsies, blood, body fluids, etc.) 0 []  - Specimen(s) / Culture(s) sent or taken to Lab for analysis 0 []  - Patient Transfer (multiple staff / Harrel Lemon Lift / Similar devices) 0 []  - Simple Staple / Suture removal (25 or less) 0 []  - Complex Staple / Suture removal (26 or more) 0 []  - Hypo / Hyperglycemic Management (close monitor of Blood Glucose) 0 []  - Ankle / Brachial Index (ABI) - do not check if billed separately 0 X - Vital Signs 1 5 Has the patient been seen at the hospital within the last three years: Yes Total Score: 65 Level Of Care: New/Established - Level 2 Electronic Signature(s) Signed: 08/17/2015 12:29:49 PM By: Montey Hora Entered By: Montey Hora on 08/17/2015 08:28:15 [name], [name] ([id number]) -------------------------------------------------------------------------------- Encounter Discharge Information Details Patient Name: Becky Stall L. Date of Service: 08/17/2015 8:00 AM Medical Record Patient Account Number: [id number] Number: Treating RN: Montey Hora [birth year] (74 y.o. Other Clinician: Date of Birth/Sex: Female) Treating ROBSON, MICHAEL Primary Care Physician/Extender: Irene Limbo Physician: Referring Physician: Aquilla Hacker in Treatment: 5 Encounter Discharge  Information Items Discharge Pain Level: 0 Discharge Condition: Stable Ambulatory Status: Ambulatory Discharge Destination: Home Transportation: Private Auto Accompanied By: self Schedule Follow-up Appointment: No Medication Reconciliation completed and provided to Patient/Care No Jaren Vanetten: Provided on Clinical Summary of Care: 08/17/2015 Form Type Recipient Paper Patient MB Electronic Signature(s) Signed: 08/17/2015 8:30:17 AM By: Ruthine Dose Entered By: Ruthine Dose on 08/17/2015 08:30:17 [name], [name] ([id number]) -------------------------------------------------------------------------------- Lower Extremity Assessment Details Patient Name: [name], [name] Date of Service: 08/17/2015 8:00 AM Medical Record Patient Account Number: [id number] Number: Treating RN: Montey Hora [birth year] (74 y.o. Other Clinician: Date of Birth/Sex: Female) Treating ROBSON, MICHAEL Primary Care Physician/Extender: Irene Limbo Physician: Referring Physician: Leretha Pol Weeks in Treatment: 5 Edema Assessment Assessed: [Left: No] [Right: No] E[Left: dema] [Right: :] Calf Left: Right: Point of Measurement: 30 cm From Medial Instep cm 34 cm Ankle Left: Right: Point of Measurement: 10 cm From Medial Instep cm 20.5 cm Vascular Assessment Pulses: Posterior Tibial Dorsalis Pedis Palpable: [Right:Yes] Extremity colors, hair growth, and conditions: Extremity Color: [Right:Hyperpigmented] Hair Growth on Extremity: [Right:Yes] Temperature of Extremity: [Right:Warm] Capillary Refill: [Right:< 3 seconds] Electronic Signature(s) Signed: 08/17/2015 12:29:49 PM By: Montey Hora Entered By: Montey Hora on 08/17/2015 08:09:36 [name], [name] ([id number]) -------------------------------------------------------------------------------- Muddy Details Patient Name: Becky Stall L. Date of Service: 08/17/2015 8:00 AM Medical Record Patient  Account Number: [id number] Number: Treating RN: Montey Hora 1940-12-08 (74 y.o. Other Clinician: Date of Birth/Sex: Female) Treating ROBSON, MICHAEL Primary Care Physician/Extender: Irene Limbo Physician: Referring Physician: Leretha Pol Weeks in Treatment: 5 Active Inactive Electronic Signature(s) Signed: 08/17/2015 12:29:49 PM By: Montey Hora Entered By: Montey Hora on 08/17/2015 08:27:35 [name], [name] ([id number]) -------------------------------------------------------------------------------- Pain Assessment Details Patient Name: [name], [name] Date of Service: 08/17/2015 8:00 AM Medical Record Patient Account Number: [id number] Number: Treating RN: Montey Hora Aug 21, 1940 (74 y.o.  Other Clinician: Date of Birth/Sex: Female) Treating ROBSON, MICHAEL Primary Care Physician/Extender: Irene Limbo Physician: Referring Physician: Aquilla Hacker in Treatment: 5 Active Problems Location of Pain Severity and Description of Pain Patient Has Paino Yes Site Locations Pain Location: Generalized Pain Duration of the Pain. Constant / Intermittento Constant Pain Management and Medication Current Pain Management: Electronic Signature(s) Signed: 08/17/2015 12:29:49 PM By: Montey Hora Entered By: Montey Hora on 08/17/2015 08:07:53 [name], [name] ([id number]) -------------------------------------------------------------------------------- Patient/Caregiver Education Details Patient Name: Becky Stall L. Date of Service: 08/17/2015 8:00 AM Medical Record Patient Account Number: [id number] Number: Treating RN: Montey Hora 1940-06-26 (74 y.o. Other Clinician: Date of Birth/Gender: Female) Treating ROBSON, [place] Primary Care Physician: Leretha Pol Physician/Extender: G Referring Physician: Aquilla Hacker in Treatment: 5 Education Assessment Education Provided To: Patient Education  Topics Provided Venous: Handouts: Other: compression hose ordered Methods: Explain/Verbal Responses: State content correctly Electronic Signature(s) Signed: 08/17/2015 12:29:49 PM By: Montey Hora Entered By: Montey Hora on 08/17/2015 08:28:56 [name], [name] ([id number]) -------------------------------------------------------------------------------- Vitals Details Patient Name: [name], [name] Date of Service: 08/17/2015 8:00 AM Medical Record Patient Account Number: [id number] Number: Treating RN: Montey Hora 1941/03/31 (74 y.o. Other Clinician: Date of Birth/Sex: Female) Treating ROBSON, MICHAEL Primary Care Physician/Extender: Irene Limbo Physician: Referring Physician: Leretha Pol Weeks in Treatment: 5 Vital Signs Time Taken: 08:10 Temperature (F): 98.3 Height (in): 62 Pulse (bpm): 55 Weight (lbs): 151 Respiratory Rate (breaths/min): 18 Body Mass Index (BMI): 27.6 Blood Pressure (mmHg): 180/68 Reference Range: 80 - 120 mg / dl Electronic Signature(s) Signed: 08/17/2015 12:29:49 PM By: Montey Hora Entered By: Montey Hora on 08/17/2015 08:13:27

## 2015-08-20 NOTE — Progress Notes (Signed)
[name] ([id number]) Visit Report for 08/17/2015 Chief Complaint Document Details Patient Name: [name], [name]. Date of Service: 08/17/2015 8:00 AM Medical Record Patient Account Number: [id number] Number: Treating RN: [name] [birth year] (75 y.o. Other Clinician: Date of Birth/Sex: Female) Treating [name] Primary Care Physician/Extender: [name] Physician: Referring Physician: Aquilla [name] in Treatment: 5 Information Obtained from: Patient Chief Complaint Patient is referred here for erythema and swelling on the medial right leg. There is actually not a wound Electronic Signature(s) Signed: 08/18/2015 4:33:06 PM By: Linton Ham MD Entered By: Linton Ham on 08/17/2015 13:33:50 [name], [name] ([id number]) -------------------------------------------------------------------------------- HPI Details Patient Name: Becky Stall L. Date of Service: 08/17/2015 8:00 AM Medical Record Patient Account Number: [id number] Number: Treating RN: [name] 1941/05/08 (75 y.o. Other Clinician: Date of Birth/Sex: Female) Treating [name] Primary Care Physician/Extender: [name] Physician: Referring Physician: Leretha [name] Weeks in Treatment: 5 History of Present Illness HPI Description: 07/13/15; this is a patient who was sent here for review of an area on her right medial leg. She was seen by her primary care doctor last week and started on Keflex. Venous studies did not show DVT or evidence of reflux. She does not actually have an open wound. She tells me that roughly 3 weeks ago she developed some erythema and swelling on the medial right leg making it look different from her left leg. This has progressed and become very tender. She has not been systemically unwell. She is a diabetic. She is also recently treated for herpes zoster over her left anterior lower thigh. She has chronic  renal insufficiency and is listed as having PAD. Her ABI in this clinic was 1.23 on the left. Could not be obtained on the right due to pain. She was also sent for a duplex ultrasound of the right leg that did not show evidence of a DVT or reflux 07/20/15. The patient returns having completed her Keflex. She had a venous duplex that did not show DVT or venous reflux. Once again she returns with a inflamed, warm, hard area on the medial aspect of her right leg. This is exquisitely painful. It is not worse than last week but certainly no better. I have marked the edges of this. She does not have arterial insufficiency with palpable pulses and an ABI of 1.23. I once again reviewed the history here, the patient absolutely denies trauma fever chills. 07/27/15; the patient's area on the medial aspect of her right leg appears to be less swollen but it is still warm and and exquisitely tender. She didn't start doxycycline last week I will continue that at this week although I'm not the least bit sure that this is cellulitis. She had an x-ray done of her leg that was negative [insurance insistence as a prelude to an [place]. The patient has simply been placed wrapping this area 08/03/15; the area continues to be less swollen but still warm and exquisitely tender. She has a history of PAD but I don't think this is a primary issue. She has a history of diabetes and per the patient rheumatoid arthritis she takes methotrexate and prednisone. Her MRI is not until march 17 08/17/15; the area continues to be less swollen but no less painful. I went on to do an MRI on her both legs were done. This shows only subcutaneous edema right greater than left. There was no muscle or bone involvement Electronic Signature(s) Signed: 08/18/2015 4:33:06 PM By: Linton Ham  MD Entered By: Linton Ham on 08/17/2015 13:37:14 [name]  ([id number]) -------------------------------------------------------------------------------- Physical Exam Details Patient Name: [name], [name] Date of Service: 08/17/2015 8:00 AM Medical Record Patient Account Number: [id number] Number: Treating RN: [name] Apr 05, 1941 (75 y.o. Other Clinician: Date of Birth/Sex: Female) Treating [name] Primary Care Physician/Extender: [name] Physician: Referring Physician: Leretha [name] Weeks in Treatment: 5 Cardiovascular Pedal pulses palpable and strong bilaterally.. The patient does not have major edema. Intense area of discoloration on her medial right leg is warm and tender but generally less swollen than when she first came in. Lymphatic Nonpalpable in the popliteal or inguinal area. Musculoskeletal Patient has a history of rheumatoid arthritis but has no active joints. Notes Wound exam. There is no open area. A large discolored area on the medial aspect of her right ankle and lower leg. This is very tender and firm up. Certainly less swollen than when she first came in. Electronic Signature(s) Signed: 08/18/2015 4:33:06 PM By: Linton Ham MD Entered By: Linton Ham on 08/17/2015 13:39:51 [name], [name] ([id number]) -------------------------------------------------------------------------------- Physician Orders Details Patient Name: Becky Stall L. Date of Service: 08/17/2015 8:00 AM Medical Record Patient Account Number: [id number] Number: Treating RN: [name] [birth year] (75 y.o. Other Clinician: Date of Birth/Sex: Female) Treating [name] Primary Care Physician/Extender: [name] Physician: Referring Physician: Aquilla [name] in Treatment: 5 Verbal / Phone Orders: Yes Clinician: Montey [name] Read Back and Verified: Yes Diagnosis Coding Discharge From [facility] Services o Discharge from [place]  Signature(s) Signed: 08/17/2015 12:29:49 PM By: [name] Signed: 08/18/2015 4:33:06 PM By: Linton Ham MD Entered By: [name] on 08/17/2015 08:27:55 [name], [name] ([id number]) -------------------------------------------------------------------------------- Problem List Details Patient Name: [name], [name] Date of Service: 08/17/2015 8:00 AM Medical Record Patient Account Number: [id number] Number: Treating RN: [name] 1941-01-17 (74 y.o. 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 ([id number]) -------------------------------------------------------------------------------- Progress Note Details Patient Name: [name], [name] Date of Service: 08/17/2015 8:00 AM Medical Record Patient Account Number: [id number] Number: Treating RN: [name] 1940-06-26 (74 y.o. Other Clinician: Date of Birth/Sex: Female) Treating [name] Primary Care Physician/Extender: [name] Physician: Referring Physician: Aquilla [name] in Treatment: 5 Subjective Chief Complaint Information obtained from Patient Patient is referred here for erythema and swelling on the medial right leg.  There is actually not a wound History of Present Illness (HPI) 07/13/15; this is a patient who was sent here for review of an area on her right medial leg. She was seen by her primary care doctor last week and started on Keflex. Venous studies did not show DVT or evidence of reflux. She does not actually have an open wound. She tells me that roughly 3 weeks ago she developed some erythema and swelling on the medial right leg making it look different from her left leg. This has progressed and become  very tender. She has not been systemically unwell. She is a diabetic. She is also recently treated for herpes zoster over her left anterior lower thigh. She has chronic renal insufficiency and is listed as having PAD. Her ABI in this clinic was 1.23 on the left. Could not be obtained on the right due to pain. She was also sent for a duplex ultrasound of the right leg that did not show evidence of a DVT or reflux 07/20/15. The patient returns having completed her Keflex. She had a venous duplex that did not show DVT or venous reflux. Once again she returns with a inflamed, warm, hard area on the medial aspect of her right leg. This is exquisitely painful. It is not worse than last week but certainly no better. I have marked the edges of this. She does not have arterial insufficiency with palpable pulses and an ABI of 1.23. I once again reviewed the history here, the patient absolutely denies trauma fever chills. 07/27/15; the patient's area on the medial aspect of her right leg appears to be less swollen but it is still warm and and exquisitely tender. She didn't start doxycycline last week I will continue that at this week although I'm not the least bit sure that this is cellulitis. She had an x-ray done of her leg that was negative [insurance insistence as a prelude to an [place]. The patient has simply been placed wrapping this area 08/03/15; the area continues to be less swollen but still warm and  exquisitely tender. She has a history of PAD but I don't think this is a primary issue. She has a history of diabetes and per the patient rheumatoid arthritis she takes methotrexate and prednisone. Her MRI is not until march 17 08/17/15; the area continues to be less swollen but no less painful. I went on to do an MRI on her both legs were done. This shows only subcutaneous edema right greater than left. There was no muscle or bone involvement [name], [name] ([id number]) Objective Constitutional Vitals Time Taken: 8:10 AM, Height: 62 in, Weight: 151 lbs, BMI: 27.6, Temperature: 98.3 F, Pulse: 55 bpm, Respiratory Rate: 18 breaths/min, Blood Pressure: 180/68 mmHg. Cardiovascular Pedal pulses palpable and strong bilaterally.. The patient does not have major edema. Intense area of discoloration on her medial right leg is warm and tender but generally less swollen than when she first came in. Lymphatic Nonpalpable in the popliteal or inguinal area. Musculoskeletal Patient has a history of rheumatoid arthritis but has no active joints. General Notes: Wound exam. There is no open area. A large discolored area on the medial aspect of her right ankle and lower leg. This is very tender and firm up. Certainly less swollen than when she first came in. Assessment Active Problems ICD-10 L97.211 - Non-pressure chronic ulcer of right calf limited to breakdown of skin E11.622 - Type 2 diabetes mellitus with other skin ulcer E11.51 - Type 2 diabetes mellitus with diabetic peripheral angiopathy without gangrene E11.22 - Type 2 diabetes mellitus with diabetic chronic kidney disease Plan Discharge From [facility] Services: Discharge from [place], [place] ([id number]) 1 don't think that I can justify biopsying this at this point. The MRI report sounds completely benign. The area is much firmer and more tender than I am used to seeing nevertheless I think this is simply be followed over  time. If it turns out that this is something ominous it should spread from its current location. I have advised her to  get compression stockings Electronic Signature(s) Signed: 08/18/2015 4:33:06 PM By: Linton Ham MD Entered By: Linton Ham on 08/17/2015 13:40:59 [name], [name] ([id number]) -------------------------------------------------------------------------------- SuperBill Details Patient Name: Becky Stall L. Date of Service: 08/17/2015 Medical Record Patient Account Number: [id number] Number: Treating RN: [name] 1941/01/21 (74 y.o. Other Clinician: Date of Birth/Sex: Female) Treating [name] Primary Care Physician/Extender: [name] Physician: Weeks in Treatment: 5 Referring Physician: Leretha [name] Diagnosis Coding ICD-10 Codes Code Description [id number] Non-pressure chronic ulcer of right calf limited to breakdown of skin E11.622 Type 2 diabetes mellitus with other skin ulcer E11.51 Type 2 diabetes mellitus with diabetic peripheral angiopathy without gangrene E11.22 Type 2 diabetes mellitus with diabetic chronic kidney disease Facility Procedures CPT4 Code: [id number] Description: [id number] - WOUND CARE VISIT-LEV 2 EST PT Modifier: Quantity: 1 Physician Procedures CPT4 Code Description: [id number] - WC PHYS LEVEL 2 - EST PT ICD-10 Description Diagnosis L97.211 Non-pressure chronic ulcer of right calf limited to Modifier: breakdown of Quantity: 1 skin Electronic Signature(s) Signed: 08/18/2015 4:33:06 PM By: Linton Ham MD Entered By: Linton Ham on 08/17/2015 13:41:32

## 2015-08-31 DIAGNOSIS — M0579 Rheumatoid arthritis with rheumatoid factor of multiple sites without organ or systems involvement: Secondary | ICD-10-CM | POA: Diagnosis not present

## 2015-09-07 DIAGNOSIS — Z79899 Other long term (current) drug therapy: Secondary | ICD-10-CM | POA: Diagnosis not present

## 2015-09-07 DIAGNOSIS — M0579 Rheumatoid arthritis with rheumatoid factor of multiple sites without organ or systems involvement: Secondary | ICD-10-CM | POA: Diagnosis not present

## 2015-09-07 DIAGNOSIS — D638 Anemia in other chronic diseases classified elsewhere: Secondary | ICD-10-CM | POA: Diagnosis not present

## 2015-09-07 DIAGNOSIS — R600 Localized edema: Secondary | ICD-10-CM | POA: Diagnosis not present

## 2015-09-07 DIAGNOSIS — E119 Type 2 diabetes mellitus without complications: Secondary | ICD-10-CM | POA: Diagnosis not present

## 2015-09-13 DIAGNOSIS — Z0001 Encounter for general adult medical examination with abnormal findings: Secondary | ICD-10-CM | POA: Diagnosis not present

## 2015-09-13 DIAGNOSIS — I10 Essential (primary) hypertension: Secondary | ICD-10-CM | POA: Diagnosis not present

## 2015-09-13 DIAGNOSIS — I739 Peripheral vascular disease, unspecified: Secondary | ICD-10-CM | POA: Diagnosis not present

## 2015-09-13 DIAGNOSIS — L209 Atopic dermatitis, unspecified: Secondary | ICD-10-CM | POA: Diagnosis not present

## 2015-09-13 DIAGNOSIS — E559 Vitamin D deficiency, unspecified: Secondary | ICD-10-CM | POA: Diagnosis not present

## 2015-09-13 DIAGNOSIS — E1122 Type 2 diabetes mellitus with diabetic chronic kidney disease: Secondary | ICD-10-CM | POA: Diagnosis not present

## 2015-09-13 DIAGNOSIS — E782 Mixed hyperlipidemia: Secondary | ICD-10-CM | POA: Diagnosis not present

## 2015-09-13 DIAGNOSIS — L03115 Cellulitis of right lower limb: Secondary | ICD-10-CM | POA: Diagnosis not present

## 2015-09-14 DIAGNOSIS — R69 Illness, unspecified: Secondary | ICD-10-CM | POA: Diagnosis not present

## 2015-09-16 ENCOUNTER — Other Ambulatory Visit: Payer: Self-pay | Admitting: Nurse Practitioner

## 2015-09-16 DIAGNOSIS — E2839 Other primary ovarian failure: Secondary | ICD-10-CM

## 2015-09-16 DIAGNOSIS — Z1231 Encounter for screening mammogram for malignant neoplasm of breast: Secondary | ICD-10-CM

## 2015-09-17 DIAGNOSIS — E785 Hyperlipidemia, unspecified: Secondary | ICD-10-CM | POA: Diagnosis not present

## 2015-09-17 DIAGNOSIS — M79609 Pain in unspecified limb: Secondary | ICD-10-CM | POA: Diagnosis not present

## 2015-09-17 DIAGNOSIS — M7989 Other specified soft tissue disorders: Secondary | ICD-10-CM | POA: Diagnosis not present

## 2015-09-17 DIAGNOSIS — E119 Type 2 diabetes mellitus without complications: Secondary | ICD-10-CM | POA: Diagnosis not present

## 2015-09-17 DIAGNOSIS — I10 Essential (primary) hypertension: Secondary | ICD-10-CM | POA: Diagnosis not present

## 2015-09-28 DIAGNOSIS — E1122 Type 2 diabetes mellitus with diabetic chronic kidney disease: Secondary | ICD-10-CM | POA: Diagnosis not present

## 2015-09-28 DIAGNOSIS — M059 Rheumatoid arthritis with rheumatoid factor, unspecified: Secondary | ICD-10-CM | POA: Diagnosis not present

## 2015-09-28 DIAGNOSIS — N183 Chronic kidney disease, stage 3 (moderate): Secondary | ICD-10-CM | POA: Diagnosis not present

## 2015-09-28 DIAGNOSIS — R319 Hematuria, unspecified: Secondary | ICD-10-CM | POA: Diagnosis not present

## 2015-09-28 DIAGNOSIS — I8310 Varicose veins of unspecified lower extremity with inflammation: Secondary | ICD-10-CM | POA: Diagnosis not present

## 2015-10-06 DIAGNOSIS — R319 Hematuria, unspecified: Secondary | ICD-10-CM | POA: Diagnosis not present

## 2015-10-11 DIAGNOSIS — H40003 Preglaucoma, unspecified, bilateral: Secondary | ICD-10-CM | POA: Diagnosis not present

## 2015-10-13 DIAGNOSIS — R319 Hematuria, unspecified: Secondary | ICD-10-CM | POA: Diagnosis not present

## 2015-10-18 DIAGNOSIS — H40003 Preglaucoma, unspecified, bilateral: Secondary | ICD-10-CM | POA: Diagnosis not present

## 2015-10-20 DIAGNOSIS — I10 Essential (primary) hypertension: Secondary | ICD-10-CM | POA: Diagnosis not present

## 2015-10-20 DIAGNOSIS — M7989 Other specified soft tissue disorders: Secondary | ICD-10-CM | POA: Diagnosis not present

## 2015-10-20 DIAGNOSIS — E785 Hyperlipidemia, unspecified: Secondary | ICD-10-CM | POA: Diagnosis not present

## 2015-10-20 DIAGNOSIS — E119 Type 2 diabetes mellitus without complications: Secondary | ICD-10-CM | POA: Diagnosis not present

## 2015-10-20 DIAGNOSIS — R600 Localized edema: Secondary | ICD-10-CM | POA: Diagnosis not present

## 2015-10-20 DIAGNOSIS — M79609 Pain in unspecified limb: Secondary | ICD-10-CM | POA: Diagnosis not present

## 2015-10-28 DIAGNOSIS — I8310 Varicose veins of unspecified lower extremity with inflammation: Secondary | ICD-10-CM | POA: Diagnosis not present

## 2015-10-28 DIAGNOSIS — I10 Essential (primary) hypertension: Secondary | ICD-10-CM | POA: Diagnosis not present

## 2015-10-28 DIAGNOSIS — N183 Chronic kidney disease, stage 3 (moderate): Secondary | ICD-10-CM | POA: Diagnosis not present

## 2015-10-28 DIAGNOSIS — E1165 Type 2 diabetes mellitus with hyperglycemia: Secondary | ICD-10-CM | POA: Diagnosis not present

## 2015-11-16 DIAGNOSIS — I10 Essential (primary) hypertension: Secondary | ICD-10-CM | POA: Diagnosis not present

## 2015-11-18 DIAGNOSIS — I10 Essential (primary) hypertension: Secondary | ICD-10-CM | POA: Diagnosis not present

## 2015-11-18 DIAGNOSIS — M064 Inflammatory polyarthropathy: Secondary | ICD-10-CM | POA: Diagnosis not present

## 2015-11-18 DIAGNOSIS — E1122 Type 2 diabetes mellitus with diabetic chronic kidney disease: Secondary | ICD-10-CM | POA: Diagnosis not present

## 2015-11-18 DIAGNOSIS — N183 Chronic kidney disease, stage 3 (moderate): Secondary | ICD-10-CM | POA: Diagnosis not present

## 2015-11-19 DIAGNOSIS — R69 Illness, unspecified: Secondary | ICD-10-CM | POA: Diagnosis not present

## 2015-12-08 DIAGNOSIS — Z79899 Other long term (current) drug therapy: Secondary | ICD-10-CM | POA: Diagnosis not present

## 2015-12-08 DIAGNOSIS — M0579 Rheumatoid arthritis with rheumatoid factor of multiple sites without organ or systems involvement: Secondary | ICD-10-CM | POA: Diagnosis not present

## 2015-12-16 ENCOUNTER — Ambulatory Visit
Admission: RE | Admit: 2015-12-16 | Discharge: 2015-12-16 | Disposition: A | Discharge status: Home / Self Care | Payer: Medicare HMO | Source: Ambulatory Visit | Attending: Nurse Practitioner | Admitting: Nurse Practitioner

## 2015-12-16 ENCOUNTER — Other Ambulatory Visit: Payer: Self-pay | Admitting: Nurse Practitioner

## 2015-12-16 DIAGNOSIS — Z1231 Encounter for screening mammogram for malignant neoplasm of breast: Secondary | ICD-10-CM

## 2015-12-16 DIAGNOSIS — M8588 Other specified disorders of bone density and structure, other site: Secondary | ICD-10-CM | POA: Diagnosis not present

## 2015-12-16 DIAGNOSIS — E2839 Other primary ovarian failure: Secondary | ICD-10-CM | POA: Insufficient documentation

## 2015-12-16 DIAGNOSIS — M85851 Other specified disorders of bone density and structure, right thigh: Secondary | ICD-10-CM | POA: Diagnosis not present

## 2016-01-28 DIAGNOSIS — M7989 Other specified soft tissue disorders: Secondary | ICD-10-CM | POA: Diagnosis not present

## 2016-01-28 DIAGNOSIS — M79609 Pain in unspecified limb: Secondary | ICD-10-CM | POA: Diagnosis not present

## 2016-01-28 DIAGNOSIS — I10 Essential (primary) hypertension: Secondary | ICD-10-CM | POA: Diagnosis not present

## 2016-01-28 DIAGNOSIS — R600 Localized edema: Secondary | ICD-10-CM | POA: Diagnosis not present

## 2016-01-28 DIAGNOSIS — E785 Hyperlipidemia, unspecified: Secondary | ICD-10-CM | POA: Diagnosis not present

## 2016-01-28 DIAGNOSIS — I83811 Varicose veins of right lower extremities with pain: Secondary | ICD-10-CM | POA: Diagnosis not present

## 2016-01-28 DIAGNOSIS — E119 Type 2 diabetes mellitus without complications: Secondary | ICD-10-CM | POA: Diagnosis not present

## 2016-02-23 DIAGNOSIS — I8310 Varicose veins of unspecified lower extremity with inflammation: Secondary | ICD-10-CM | POA: Diagnosis not present

## 2016-02-23 DIAGNOSIS — M059 Rheumatoid arthritis with rheumatoid factor, unspecified: Secondary | ICD-10-CM | POA: Diagnosis not present

## 2016-02-23 DIAGNOSIS — E1122 Type 2 diabetes mellitus with diabetic chronic kidney disease: Secondary | ICD-10-CM | POA: Diagnosis not present

## 2016-02-23 DIAGNOSIS — I10 Essential (primary) hypertension: Secondary | ICD-10-CM | POA: Diagnosis not present

## 2016-03-01 DIAGNOSIS — Z23 Encounter for immunization: Secondary | ICD-10-CM | POA: Diagnosis not present

## 2016-03-01 DIAGNOSIS — M0579 Rheumatoid arthritis with rheumatoid factor of multiple sites without organ or systems involvement: Secondary | ICD-10-CM | POA: Diagnosis not present

## 2016-03-01 DIAGNOSIS — Z79899 Other long term (current) drug therapy: Secondary | ICD-10-CM | POA: Diagnosis not present

## 2016-03-08 DIAGNOSIS — D638 Anemia in other chronic diseases classified elsewhere: Secondary | ICD-10-CM | POA: Diagnosis not present

## 2016-03-08 DIAGNOSIS — M65342 Trigger finger, left ring finger: Secondary | ICD-10-CM | POA: Diagnosis not present

## 2016-03-08 DIAGNOSIS — R600 Localized edema: Secondary | ICD-10-CM | POA: Diagnosis not present

## 2016-03-08 DIAGNOSIS — M0579 Rheumatoid arthritis with rheumatoid factor of multiple sites without organ or systems involvement: Secondary | ICD-10-CM | POA: Diagnosis not present

## 2016-03-08 DIAGNOSIS — E119 Type 2 diabetes mellitus without complications: Secondary | ICD-10-CM | POA: Diagnosis not present

## 2016-03-20 DIAGNOSIS — E1122 Type 2 diabetes mellitus with diabetic chronic kidney disease: Secondary | ICD-10-CM | POA: Diagnosis not present

## 2016-03-20 DIAGNOSIS — K219 Gastro-esophageal reflux disease without esophagitis: Secondary | ICD-10-CM | POA: Diagnosis not present

## 2016-03-20 DIAGNOSIS — I10 Essential (primary) hypertension: Secondary | ICD-10-CM | POA: Diagnosis not present

## 2016-03-20 DIAGNOSIS — Z0000 Encounter for general adult medical examination without abnormal findings: Secondary | ICD-10-CM | POA: Diagnosis not present

## 2016-03-20 DIAGNOSIS — E1151 Type 2 diabetes mellitus with diabetic peripheral angiopathy without gangrene: Secondary | ICD-10-CM | POA: Diagnosis not present

## 2016-03-20 DIAGNOSIS — M0579 Rheumatoid arthritis with rheumatoid factor of multiple sites without organ or systems involvement: Secondary | ICD-10-CM | POA: Diagnosis not present

## 2016-03-23 DIAGNOSIS — R2689 Other abnormalities of gait and mobility: Secondary | ICD-10-CM | POA: Diagnosis not present

## 2016-03-23 DIAGNOSIS — G4733 Obstructive sleep apnea (adult) (pediatric): Secondary | ICD-10-CM | POA: Diagnosis not present

## 2016-03-23 DIAGNOSIS — R69 Illness, unspecified: Secondary | ICD-10-CM | POA: Diagnosis not present

## 2016-04-18 DIAGNOSIS — H40003 Preglaucoma, unspecified, bilateral: Secondary | ICD-10-CM | POA: Diagnosis not present

## 2016-05-21 DIAGNOSIS — R69 Illness, unspecified: Secondary | ICD-10-CM | POA: Diagnosis not present

## 2016-05-25 DIAGNOSIS — M059 Rheumatoid arthritis with rheumatoid factor, unspecified: Secondary | ICD-10-CM | POA: Diagnosis not present

## 2016-05-25 DIAGNOSIS — I10 Essential (primary) hypertension: Secondary | ICD-10-CM | POA: Diagnosis not present

## 2016-05-25 DIAGNOSIS — R011 Cardiac murmur, unspecified: Secondary | ICD-10-CM | POA: Diagnosis not present

## 2016-05-25 DIAGNOSIS — E1165 Type 2 diabetes mellitus with hyperglycemia: Secondary | ICD-10-CM | POA: Diagnosis not present

## 2016-06-13 DIAGNOSIS — D638 Anemia in other chronic diseases classified elsewhere: Secondary | ICD-10-CM | POA: Diagnosis not present

## 2016-06-13 DIAGNOSIS — M0579 Rheumatoid arthritis with rheumatoid factor of multiple sites without organ or systems involvement: Secondary | ICD-10-CM | POA: Diagnosis not present

## 2016-06-19 DIAGNOSIS — E1165 Type 2 diabetes mellitus with hyperglycemia: Secondary | ICD-10-CM | POA: Diagnosis not present

## 2016-06-19 DIAGNOSIS — I10 Essential (primary) hypertension: Secondary | ICD-10-CM | POA: Diagnosis not present

## 2016-06-19 DIAGNOSIS — E1151 Type 2 diabetes mellitus with diabetic peripheral angiopathy without gangrene: Secondary | ICD-10-CM | POA: Diagnosis not present

## 2016-06-19 DIAGNOSIS — J019 Acute sinusitis, unspecified: Secondary | ICD-10-CM | POA: Diagnosis not present

## 2016-06-19 DIAGNOSIS — E782 Mixed hyperlipidemia: Secondary | ICD-10-CM | POA: Diagnosis not present

## 2016-06-28 DIAGNOSIS — M059 Rheumatoid arthritis with rheumatoid factor, unspecified: Secondary | ICD-10-CM | POA: Diagnosis not present

## 2016-06-28 DIAGNOSIS — E119 Type 2 diabetes mellitus without complications: Secondary | ICD-10-CM | POA: Diagnosis not present

## 2016-06-28 DIAGNOSIS — I10 Essential (primary) hypertension: Secondary | ICD-10-CM | POA: Diagnosis not present

## 2016-08-18 DIAGNOSIS — I872 Venous insufficiency (chronic) (peripheral): Secondary | ICD-10-CM | POA: Diagnosis not present

## 2016-08-18 DIAGNOSIS — E1140 Type 2 diabetes mellitus with diabetic neuropathy, unspecified: Secondary | ICD-10-CM | POA: Diagnosis not present

## 2016-08-31 DIAGNOSIS — D638 Anemia in other chronic diseases classified elsewhere: Secondary | ICD-10-CM | POA: Diagnosis not present

## 2016-08-31 DIAGNOSIS — M0579 Rheumatoid arthritis with rheumatoid factor of multiple sites without organ or systems involvement: Secondary | ICD-10-CM | POA: Diagnosis not present

## 2016-09-06 DIAGNOSIS — E119 Type 2 diabetes mellitus without complications: Secondary | ICD-10-CM | POA: Insufficient documentation

## 2016-09-06 DIAGNOSIS — E1165 Type 2 diabetes mellitus with hyperglycemia: Secondary | ICD-10-CM | POA: Insufficient documentation

## 2016-09-06 DIAGNOSIS — M0579 Rheumatoid arthritis with rheumatoid factor of multiple sites without organ or systems involvement: Secondary | ICD-10-CM | POA: Insufficient documentation

## 2016-09-06 DIAGNOSIS — R600 Localized edema: Secondary | ICD-10-CM | POA: Diagnosis not present

## 2016-09-19 ENCOUNTER — Other Ambulatory Visit: Payer: Self-pay | Admitting: Internal Medicine

## 2016-09-19 DIAGNOSIS — M064 Inflammatory polyarthropathy: Secondary | ICD-10-CM | POA: Diagnosis not present

## 2016-09-19 DIAGNOSIS — Z0001 Encounter for general adult medical examination with abnormal findings: Secondary | ICD-10-CM | POA: Diagnosis not present

## 2016-09-19 DIAGNOSIS — I6523 Occlusion and stenosis of bilateral carotid arteries: Secondary | ICD-10-CM | POA: Diagnosis not present

## 2016-09-19 DIAGNOSIS — I10 Essential (primary) hypertension: Secondary | ICD-10-CM | POA: Diagnosis not present

## 2016-09-19 DIAGNOSIS — Z1231 Encounter for screening mammogram for malignant neoplasm of breast: Secondary | ICD-10-CM

## 2016-09-19 DIAGNOSIS — N182 Chronic kidney disease, stage 2 (mild): Secondary | ICD-10-CM | POA: Diagnosis not present

## 2016-09-19 DIAGNOSIS — E1151 Type 2 diabetes mellitus with diabetic peripheral angiopathy without gangrene: Secondary | ICD-10-CM | POA: Diagnosis not present

## 2016-09-19 DIAGNOSIS — M059 Rheumatoid arthritis with rheumatoid factor, unspecified: Secondary | ICD-10-CM | POA: Diagnosis not present

## 2016-09-21 DIAGNOSIS — R69 Illness, unspecified: Secondary | ICD-10-CM | POA: Diagnosis not present

## 2016-10-11 DIAGNOSIS — I6523 Occlusion and stenosis of bilateral carotid arteries: Secondary | ICD-10-CM | POA: Diagnosis not present

## 2016-10-16 DIAGNOSIS — H40003 Preglaucoma, unspecified, bilateral: Secondary | ICD-10-CM | POA: Diagnosis not present

## 2016-10-24 DIAGNOSIS — H40003 Preglaucoma, unspecified, bilateral: Secondary | ICD-10-CM | POA: Diagnosis not present

## 2016-11-23 DIAGNOSIS — Z7984 Long term (current) use of oral hypoglycemic drugs: Secondary | ICD-10-CM | POA: Diagnosis not present

## 2016-11-23 DIAGNOSIS — M0579 Rheumatoid arthritis with rheumatoid factor of multiple sites without organ or systems involvement: Secondary | ICD-10-CM | POA: Diagnosis not present

## 2016-11-23 DIAGNOSIS — E1151 Type 2 diabetes mellitus with diabetic peripheral angiopathy without gangrene: Secondary | ICD-10-CM | POA: Diagnosis not present

## 2016-11-23 DIAGNOSIS — E1122 Type 2 diabetes mellitus with diabetic chronic kidney disease: Secondary | ICD-10-CM | POA: Diagnosis not present

## 2016-11-23 DIAGNOSIS — H259 Unspecified age-related cataract: Secondary | ICD-10-CM | POA: Diagnosis not present

## 2016-11-23 DIAGNOSIS — Z6827 Body mass index (BMI) 27.0-27.9, adult: Secondary | ICD-10-CM | POA: Diagnosis not present

## 2016-11-23 DIAGNOSIS — K219 Gastro-esophageal reflux disease without esophagitis: Secondary | ICD-10-CM | POA: Diagnosis not present

## 2016-11-23 DIAGNOSIS — Z7982 Long term (current) use of aspirin: Secondary | ICD-10-CM | POA: Diagnosis not present

## 2016-11-23 DIAGNOSIS — I10 Essential (primary) hypertension: Secondary | ICD-10-CM | POA: Diagnosis not present

## 2016-11-23 DIAGNOSIS — Z0000 Encounter for general adult medical examination without abnormal findings: Secondary | ICD-10-CM | POA: Diagnosis not present

## 2016-12-06 DIAGNOSIS — M0579 Rheumatoid arthritis with rheumatoid factor of multiple sites without organ or systems involvement: Secondary | ICD-10-CM | POA: Diagnosis not present

## 2016-12-13 DIAGNOSIS — R69 Illness, unspecified: Secondary | ICD-10-CM | POA: Diagnosis not present

## 2016-12-19 ENCOUNTER — Ambulatory Visit
Admission: RE | Admit: 2016-12-19 | Discharge: 2016-12-19 | Disposition: A | Discharge status: Home / Self Care | Payer: Medicare HMO | Source: Ambulatory Visit | Attending: Internal Medicine | Admitting: Internal Medicine

## 2016-12-19 DIAGNOSIS — Z1231 Encounter for screening mammogram for malignant neoplasm of breast: Secondary | ICD-10-CM | POA: Diagnosis not present

## 2016-12-19 DIAGNOSIS — G8929 Other chronic pain: Secondary | ICD-10-CM | POA: Diagnosis not present

## 2016-12-19 DIAGNOSIS — M0579 Rheumatoid arthritis with rheumatoid factor of multiple sites without organ or systems involvement: Secondary | ICD-10-CM | POA: Diagnosis not present

## 2016-12-19 DIAGNOSIS — R600 Localized edema: Secondary | ICD-10-CM | POA: Diagnosis not present

## 2016-12-19 DIAGNOSIS — E119 Type 2 diabetes mellitus without complications: Secondary | ICD-10-CM | POA: Diagnosis not present

## 2016-12-19 DIAGNOSIS — M25562 Pain in left knee: Secondary | ICD-10-CM | POA: Diagnosis not present

## 2017-03-08 DIAGNOSIS — M0579 Rheumatoid arthritis with rheumatoid factor of multiple sites without organ or systems involvement: Secondary | ICD-10-CM | POA: Diagnosis not present

## 2017-03-14 DIAGNOSIS — Z79899 Other long term (current) drug therapy: Secondary | ICD-10-CM | POA: Diagnosis not present

## 2017-03-14 DIAGNOSIS — M25562 Pain in left knee: Secondary | ICD-10-CM | POA: Diagnosis not present

## 2017-03-14 DIAGNOSIS — M0579 Rheumatoid arthritis with rheumatoid factor of multiple sites without organ or systems involvement: Secondary | ICD-10-CM | POA: Diagnosis not present

## 2017-03-14 DIAGNOSIS — G8929 Other chronic pain: Secondary | ICD-10-CM | POA: Diagnosis not present

## 2017-03-20 DIAGNOSIS — M059 Rheumatoid arthritis with rheumatoid factor, unspecified: Secondary | ICD-10-CM | POA: Diagnosis not present

## 2017-03-20 DIAGNOSIS — E559 Vitamin D deficiency, unspecified: Secondary | ICD-10-CM | POA: Diagnosis not present

## 2017-03-20 DIAGNOSIS — N182 Chronic kidney disease, stage 2 (mild): Secondary | ICD-10-CM | POA: Diagnosis not present

## 2017-03-20 DIAGNOSIS — Z23 Encounter for immunization: Secondary | ICD-10-CM | POA: Diagnosis not present

## 2017-03-20 DIAGNOSIS — E1151 Type 2 diabetes mellitus with diabetic peripheral angiopathy without gangrene: Secondary | ICD-10-CM | POA: Diagnosis not present

## 2017-03-22 DIAGNOSIS — R69 Illness, unspecified: Secondary | ICD-10-CM | POA: Diagnosis not present

## 2017-03-31 ENCOUNTER — Emergency Department
Admission: EM | Admit: 2017-03-31 | Discharge: 2017-03-31 | Disposition: A | Discharge status: Home / Self Care | Payer: Medicare HMO | Attending: Emergency Medicine | Admitting: Emergency Medicine

## 2017-03-31 ENCOUNTER — Encounter: Payer: Self-pay | Admitting: Emergency Medicine

## 2017-03-31 DIAGNOSIS — E119 Type 2 diabetes mellitus without complications: Secondary | ICD-10-CM | POA: Insufficient documentation

## 2017-03-31 DIAGNOSIS — K625 Hemorrhage of anus and rectum: Secondary | ICD-10-CM | POA: Insufficient documentation

## 2017-03-31 DIAGNOSIS — I10 Essential (primary) hypertension: Secondary | ICD-10-CM | POA: Insufficient documentation

## 2017-03-31 DIAGNOSIS — Z79899 Other long term (current) drug therapy: Secondary | ICD-10-CM | POA: Diagnosis not present

## 2017-03-31 HISTORY — DX: Essential (primary) hypertension: I10

## 2017-03-31 HISTORY — DX: Type 2 diabetes mellitus without complications: E11.9

## 2017-03-31 HISTORY — DX: Unspecified osteoarthritis, unspecified site: M19.90

## 2017-03-31 HISTORY — DX: Gastro-esophageal reflux disease without esophagitis: K21.9

## 2017-03-31 LAB — COMPREHENSIVE METABOLIC PANEL
ALK PHOS: 84 U/L (ref 38–126)
ALT: 18 U/L (ref 14–54)
AST: 23 U/L (ref 15–41)
Albumin: 3.9 g/dL (ref 3.5–5.0)
Anion gap: 7 (ref 5–15)
BILIRUBIN TOTAL: 0.7 mg/dL (ref 0.3–1.2)
BUN: 12 mg/dL (ref 6–20)
CALCIUM: 9.1 mg/dL (ref 8.9–10.3)
CHLORIDE: 104 mmol/L (ref 101–111)
CO2: 29 mmol/L (ref 22–32)
CREATININE: 1.02 mg/dL — AB (ref 0.44–1.00)
GFR, EST AFRICAN AMERICAN: 59 mL/min — AB (ref >60)
GFR, EST NON AFRICAN AMERICAN: 51 mL/min — AB (ref >60)
Glucose, Bld: 175 mg/dL — ABNORMAL HIGH (ref 65–99)
Potassium: 3.9 mmol/L (ref 3.5–5.1)
Sodium: 140 mmol/L (ref 135–145)
Total Protein: 7 g/dL (ref 6.5–8.1)

## 2017-03-31 LAB — CBC
HCT: 36.7 % (ref 35.0–47.0)
HEMATOCRIT: 36.4 % (ref 35.0–47.0)
Hemoglobin: 11.5 g/dL — ABNORMAL LOW (ref 12.0–16.0)
Hemoglobin: 11.8 g/dL — ABNORMAL LOW (ref 12.0–16.0)
MCH: 27.3 pg (ref 26.0–34.0)
MCH: 27.5 pg (ref 26.0–34.0)
MCHC: 31.6 g/dL — ABNORMAL LOW (ref 32.0–36.0)
MCHC: 32.2 g/dL (ref 32.0–36.0)
MCV: 86.3 fL (ref 80.0–100.0)
MCV: 85.4 fL (ref 80.0–100.0)
PLATELETS: 165 10*3/uL (ref 150–440)
Platelets: 172 10*3/uL (ref 150–440)
RBC: 4.21 MIL/uL (ref 3.80–5.20)
RBC: 4.29 MIL/uL (ref 3.80–5.20)
RDW: 17.1 % — AB (ref 11.5–14.5)
RDW: 16.6 % — ABNORMAL HIGH (ref 11.5–14.5)
WBC: 4 10*3/uL (ref 3.6–11.0)
WBC: 4.4 10*3/uL (ref 3.6–11.0)

## 2017-03-31 LAB — TYPE AND SCREEN
ABO/RH(D): A POS
ANTIBODY SCREEN: NEGATIVE

## 2017-03-31 NOTE — ED Notes (Signed)
Pt took her 2nd dose of bp med that she missed this afternoon.

## 2017-03-31 NOTE — ED Triage Notes (Signed)
Patient arrives from home Via [facility] with c/o bloody stool x 2 since last night. Patient describes the episode as "painless" and bright red bloody stool. Denies N/V/D ShOB, C/P. NAD Noted

## 2017-03-31 NOTE — ED Provider Notes (Signed)
[facility] Emergency Department Provider Note   ____________________________________________   I have reviewed the triage vital signs and the nursing notes.   HISTORY  Chief Complaint Rectal Bleeding   History limited by: Not Limited   HPI [name] is a 76 y.o. female who presents to the emergency department today because of concern for rectal bleeding.   LOCATION:rectum DURATION:started yesterday TIMING: 2 episodes QUALITY: bright red blood CONTEXT: patient states had one episode yesterday and again in the middle of the night. She denies any straining or recent constipation. Denies being on any blood thinners MODIFYING FACTORS: none identified ASSOCIATED SYMPTOMS: denies any abdominal pain. Denies nausea. Denies weakness, shortness of breath or chest pain.  Per medical record review patient has a history of DM and arthritis.  Past Medical History:  Diagnosis Date  . Arthritis   . Diabetes mellitus without complication ([place])   . GERD (gastroesophageal reflux disease)   . Hypertension     There are no active problems to display for this patient.   History reviewed. No pertinent surgical history.  Prior to Admission medications   Medication Sig Start Date End Date Taking? Authorizing Provider  acetaminophen (TYLENOL) 500 MG tablet Take 500-1,000 mg by mouth every 6 (six) hours as needed.   Yes [provider]  amLODipine (NORVASC) 5 MG tablet Take 10 mg by mouth every evening.   Yes [provider]  aspirin EC 81 MG tablet Take 81 mg by mouth daily.   Yes [provider]  atorvastatin (LIPITOR) 10 MG tablet Take 10 mg by mouth at bedtime.   Yes [provider]  ferrous sulfate 325 (65 FE) MG EC tablet Take 325 mg by mouth daily.   Yes [provider]  folic acid (FOLVITE) 1 MG tablet Take 2 mg by mouth daily.   Yes [provider]  glimepiride (AMARYL) 4 MG tablet Take 2 mg by  mouth 2 (two) times daily with a meal.   Yes [provider]  hydrALAZINE (APRESOLINE) 10 MG tablet Take 10 mg by mouth 3 (three) times daily.   Yes [provider]  meloxicam (MOBIC) 7.5 MG tablet Take 7.5 mg by mouth every morning.   Yes [provider]  methotrexate 2.5 MG tablet Take 15 mg by mouth daily.   Yes [provider]  pantoprazole (PROTONIX) 40 MG tablet Take 40 mg by mouth daily.   Yes [provider]  traMADol (ULTRAM) 50 MG tablet Take 50 mg by mouth 2 (two) times daily.   Yes [provider]    Allergies Patient has no allergy information on record.  Family History  Problem Relation Age of Onset  . Breast cancer Neg Hx     Social History Social History  Substance Use Topics  . Smoking status: Never Smoker  . Smokeless tobacco: Never Used  . Alcohol use No    Review of Systems Constitutional: No fever/chills Eyes: No visual changes. ENT: No sore throat. Cardiovascular: Denies chest pain. Respiratory: Denies shortness of breath. Gastrointestinal: No abdominal pain.  Positive for rectal bleeding. Genitourinary: Negative for dysuria. Musculoskeletal: Negative for back pain. Skin: Negative for rash. Neurological: Negative for headaches, focal weakness or numbness.  ____________________________________________   PHYSICAL EXAM:  VITAL SIGNS: ED Triage Vitals  Enc Vitals Group     BP 03/31/17 1022 (!) 192/73     Pulse Rate 03/31/17 1022 65     Resp 03/31/17 1022 16  Temp 03/31/17 1022 (!) 97.5 F (36.4 C)     Temp Source 03/31/17 1022 Oral     SpO2 03/31/17 1022 97 %     Weight 03/31/17 1023 146 lb (66.2 kg)     Height 03/31/17 1023 5\' 2"  (1.575 m)    Constitutional: Alert and oriented. Well appearing and in no distress. Eyes: Conjunctivae are normal.  ENT   Head: Normocephalic and atraumatic.   Nose: No congestion/rhinnorhea.   Mouth/Throat: Mucous membranes are moist.   Neck:  No stridor. Hematological/Lymphatic/Immunilogical: No cervical lymphadenopathy. Cardiovascular: Normal rate, regular rhythm.  No murmurs, rubs, or gallops.  Respiratory: Normal respiratory effort without tachypnea nor retractions. Breath sounds are clear and equal bilaterally. No wheezes/rales/rhonchi. Gastrointestinal: Soft and non tender. No rebound. No guarding.  Rectal: No active bleeding. No melena on glove. Small amount of dried blood. GUIAC positive. No hemorrhoid appreciated. Musculoskeletal: Normal range of motion in all extremities. No lower extremity edema. Neurologic:  Normal speech and language. No gross focal neurologic deficits are appreciated.  Skin:  Skin is warm, dry and intact. No rash noted. Psychiatric: Mood and affect are normal. Speech and behavior are normal. Patient exhibits appropriate insight and judgment.  ____________________________________________    LABS (pertinent positives/negatives)  CMP k 3.9, glu 175 CBC wbc 4.0, hgb 11.5->11.8  ____________________________________________   EKG  None  ____________________________________________    RADIOLOGY  None  ____________________________________________   PROCEDURES  Procedures  ____________________________________________   INITIAL IMPRESSION / ASSESSMENT AND PLAN / ED COURSE  Pertinent labs & imaging results that were available during my care of the patient were reviewed by me and considered in my medical decision making (see chart for details).  Patient presented to the emergency department today with concern for rectal bleeding. ddx would include cancer, avm, hemorrhoids, anal fissure amongst other etiologies. Patient without any further bloody stools here in the emergency department. Hemoglobin did not decrease on repeat testing. Vital signs without concerning findings. At this point think patient is safe for outpatient work up. Did discuss return precautions with patient and  family.   ____________________________________________   FINAL CLINICAL IMPRESSION(S) / ED DIAGNOSES  Final diagnoses:  Rectal bleeding     Note: This dictation was prepared with Dragon dictation. Any transcriptional errors that result from this process are unintentional     Nance Pear, MD 03/31/17 1558

## 2017-03-31 NOTE — ED Notes (Signed)
Pt states had rectal bleeding last night and this am - was having a bowel movement at the time. Did not take her bp meds today.

## 2017-03-31 NOTE — Discharge Instructions (Signed)
Please seek medical attention for any high fevers, chest pain, shortness of breath, change in behavior, persistent vomiting, bloody stool or any other new or concerning symptoms.  

## 2017-03-31 NOTE — ED Notes (Signed)
Pt given lunch tray.

## 2017-04-23 DIAGNOSIS — H40003 Preglaucoma, unspecified, bilateral: Secondary | ICD-10-CM | POA: Diagnosis not present

## 2017-05-26 ENCOUNTER — Other Ambulatory Visit: Payer: Self-pay | Admitting: Internal Medicine

## 2017-06-07 DIAGNOSIS — R69 Illness, unspecified: Secondary | ICD-10-CM | POA: Diagnosis not present

## 2017-06-13 DIAGNOSIS — Z79899 Other long term (current) drug therapy: Secondary | ICD-10-CM | POA: Diagnosis not present

## 2017-06-13 DIAGNOSIS — M0579 Rheumatoid arthritis with rheumatoid factor of multiple sites without organ or systems involvement: Secondary | ICD-10-CM | POA: Diagnosis not present

## 2017-06-28 DIAGNOSIS — E785 Hyperlipidemia, unspecified: Secondary | ICD-10-CM | POA: Diagnosis not present

## 2017-06-28 DIAGNOSIS — K5900 Constipation, unspecified: Secondary | ICD-10-CM | POA: Diagnosis not present

## 2017-06-28 DIAGNOSIS — M069 Rheumatoid arthritis, unspecified: Secondary | ICD-10-CM | POA: Diagnosis not present

## 2017-06-28 DIAGNOSIS — Z7982 Long term (current) use of aspirin: Secondary | ICD-10-CM | POA: Diagnosis not present

## 2017-06-28 DIAGNOSIS — K08109 Complete loss of teeth, unspecified cause, unspecified class: Secondary | ICD-10-CM | POA: Diagnosis not present

## 2017-06-28 DIAGNOSIS — G8929 Other chronic pain: Secondary | ICD-10-CM | POA: Diagnosis not present

## 2017-06-28 DIAGNOSIS — E119 Type 2 diabetes mellitus without complications: Secondary | ICD-10-CM | POA: Diagnosis not present

## 2017-06-28 DIAGNOSIS — K219 Gastro-esophageal reflux disease without esophagitis: Secondary | ICD-10-CM | POA: Diagnosis not present

## 2017-06-28 DIAGNOSIS — I10 Essential (primary) hypertension: Secondary | ICD-10-CM | POA: Diagnosis not present

## 2017-06-28 DIAGNOSIS — Z791 Long term (current) use of non-steroidal anti-inflammatories (NSAID): Secondary | ICD-10-CM | POA: Diagnosis not present

## 2017-07-17 ENCOUNTER — Encounter: Payer: Self-pay | Admitting: Nurse Practitioner

## 2017-07-17 ENCOUNTER — Ambulatory Visit: Payer: Medicare HMO | Admitting: Nurse Practitioner

## 2017-07-17 VITALS — BP 142/66 | HR 52 | Resp 16 | Ht 62.0 in | Wt 142.0 lb

## 2017-07-17 DIAGNOSIS — E1165 Type 2 diabetes mellitus with hyperglycemia: Secondary | ICD-10-CM | POA: Diagnosis not present

## 2017-07-17 DIAGNOSIS — M25519 Pain in unspecified shoulder: Secondary | ICD-10-CM | POA: Insufficient documentation

## 2017-07-17 DIAGNOSIS — M0579 Rheumatoid arthritis with rheumatoid factor of multiple sites without organ or systems involvement: Secondary | ICD-10-CM

## 2017-07-17 DIAGNOSIS — M179 Osteoarthritis of knee, unspecified: Secondary | ICD-10-CM | POA: Insufficient documentation

## 2017-07-17 DIAGNOSIS — I10 Essential (primary) hypertension: Secondary | ICD-10-CM | POA: Insufficient documentation

## 2017-07-17 DIAGNOSIS — Z1211 Encounter for screening for malignant neoplasm of colon: Secondary | ICD-10-CM

## 2017-07-17 DIAGNOSIS — I152 Hypertension secondary to endocrine disorders: Secondary | ICD-10-CM | POA: Insufficient documentation

## 2017-07-17 DIAGNOSIS — M1710 Unilateral primary osteoarthritis, unspecified knee: Secondary | ICD-10-CM | POA: Insufficient documentation

## 2017-07-17 MED ORDER — AMLODIPINE BESYLATE 5 MG PO TABS: 10.0000 mg | ORAL_TABLET | Freq: Every evening | ORAL | 4 refills | Status: DC

## 2017-07-17 MED ORDER — FOLIC ACID 1 MG PO TABS: 2.0000 mg | ORAL_TABLET | Freq: Every day | ORAL | 4 refills | Status: DC

## 2017-07-17 NOTE — Progress Notes (Signed)
[facility] [place], [place] [address]  Internal MEDICINE  Office Visit Note  Patient Name: [name]  [id number]  [id number]  Date of Service: 07/25/2017  Chief Complaint  Patient presents with  . Diabetes    Diabetes  She presents for her follow-up diabetic visit. She has type 2 diabetes mellitus. No MedicAlert identification noted. Her disease course has been stable. Hypoglycemia symptoms include nervousness/anxiousness. Pertinent negatives for hypoglycemia include no headaches or tremors. There are no diabetic associated symptoms. Pertinent negatives for diabetes include no chest pain and no fatigue. There are no hypoglycemic complications. Symptoms are improving. There are no known risk factors for coronary artery disease. Current diabetic treatment includes oral agent (monotherapy). She is compliant with treatment all of the time. Her weight is stable. She is following a diabetic diet. Meal planning includes ADA exchanges. She has not had a previous visit with a dietitian. She participates in exercise intermittently. There is no change in her home blood glucose trend. An ACE inhibitor/angiotensin II receptor blocker is not being taken. She does not see a podiatrist.Eye exam is current.    Pt is here for routine follow up.    Current Medication: Outpatient Encounter Medications as of 07/17/2017  Medication Sig Note  . ACCU-CHEK FASTCLIX LANCETS MISC Accu-Chek FastClix Lancing Device   . acetaminophen (TYLENOL) 500 MG tablet Take 500-1,000 mg by mouth every 6 (six) hours as needed.   . Alcohol Swabs (ALCOHOL PREP) PADS alcohol prep pads   . amLODipine (NORVASC) 5 MG tablet Take 2 tablets (10 mg total) by mouth every evening.   Marland Kitchen aspirin EC 81 MG tablet Take 81 mg by mouth daily.   Marland Kitchen atorvastatin (LIPITOR) 10 MG tablet TAKE 1 TABLET AT BEDTIME   FOR HIGH CHOLESTEROL   . ferrous sulfate 325 (65 FE) MG EC tablet Take 325 mg by mouth daily.   . folic  acid (FOLVITE) 1 MG tablet Take 2 tablets (2 mg total) by mouth daily.   Marland Kitchen glimepiride (AMARYL) 4 MG tablet Take 2 mg by mouth 2 (two) times daily with a meal.   . hydrALAZINE (APRESOLINE) 10 MG tablet Take 10 mg by mouth 3 (three) times daily.   . meloxicam (MOBIC) 7.5 MG tablet Take 7.5 mg by mouth every morning.   . methotrexate 2.5 MG tablet Take 15 mg by mouth daily. 03/31/2017: Patient takes 6 tablets every Saturday  . pantoprazole (PROTONIX) 40 MG tablet Take 40 mg by mouth daily.   . [DISCONTINUED] amLODipine (NORVASC) 5 MG tablet Take 10 mg by mouth every evening.   . [DISCONTINUED] amLODipine (NORVASC) 5 MG tablet Take 2 tablets (10 mg total) by mouth every evening.   . [DISCONTINUED] folic acid (FOLVITE) 1 MG tablet Take 2 mg by mouth daily.   Marland Kitchen glucose blood test strip Accu-Chek SmartView Test Strips   . traMADol (ULTRAM) 50 MG tablet Take 50 mg by mouth 2 (two) times daily.    No facility-administered encounter medications on file as of 07/17/2017.     Surgical History: No past surgical history on file.  Medical History: Past Medical History:  Diagnosis Date  . Arthritis   . Diabetes mellitus without complication ([place])   . GERD (gastroesophageal reflux disease)   . Hypertension     Family History: Family History  Problem Relation Age of Onset  . Breast cancer Neg Hx     Social History   Socioeconomic History  . Marital status: Married  Spouse name: Not on file  . Number of children: Not on file  . Years of education: Not on file  . Highest education level: Not on file  Social Needs  . Financial resource strain: Not on file  . Food insecurity - worry: Not on file  . Food insecurity - inability: Not on file  . Transportation needs - medical: Not on file  . Transportation needs - non-medical: Not on file  Occupational History  . Not on file  Tobacco Use  . Smoking status: Never Smoker  . Smokeless tobacco: Never Used  Substance and Sexual Activity  .  Alcohol use: No  . Drug use: No  . Sexual activity: Not on file  Other Topics Concern  . Not on file  Social History Narrative  . Not on file      Review of Systems  Constitutional: Negative for activity change, chills, fatigue and unexpected weight change.  HENT: Positive for postnasal drip. Negative for congestion, rhinorrhea, sneezing and sore throat.   Eyes: Negative.  Negative for redness.  Respiratory: Negative for cough, chest tightness and shortness of breath.   Cardiovascular: Negative for chest pain and palpitations.  Gastrointestinal: Negative for abdominal pain, constipation, diarrhea, nausea and vomiting.  Endocrine:       Blood sugars doing well   Genitourinary: Negative for dysuria and frequency.  Musculoskeletal: Negative for back pain, joint swelling and neck pain.  Skin: Negative for rash.  Allergic/Immunologic: Negative for environmental allergies, food allergies and immunocompromised state.  Neurological: Negative for tremors, numbness and headaches.  Hematological: Negative for adenopathy. Does not bruise/bleed easily.  Psychiatric/Behavioral: Negative for behavioral problems (Depression), sleep disturbance and suicidal ideas. The patient is nervous/anxious.        Recent stress and anxiety due to family stress .    Today's Vitals   07/17/17 0841  BP: (!) 142/66  Pulse: (!) 52  Resp: 16  SpO2: 100%  Weight: 142 lb (64.4 kg)  Height: 5\' 2"  (1.575 m)    Physical Exam  Constitutional: She is oriented to person, place, and time. She appears well-developed and well-nourished. No distress.  HENT:  Head: Normocephalic and atraumatic.  Mouth/Throat: Oropharynx is clear and moist. No oropharyngeal exudate.  Eyes: EOM are normal. Pupils are equal, round, and reactive to light.  Neck: Normal range of motion. Neck supple. No JVD present. No tracheal deviation present. No thyromegaly present.  Cardiovascular: Normal rate, regular rhythm and normal heart sounds.  Exam reveals no gallop and no friction rub.  No murmur heard. Pulmonary/Chest: Effort normal. No respiratory distress. She has no wheezes. She has no rales. She exhibits no tenderness.  Abdominal: Soft. Bowel sounds are normal. There is no tenderness.  Musculoskeletal: Normal range of motion.  Lymphadenopathy:    She has no cervical adenopathy.  Neurological: She is alert and oriented to person, place, and time. No cranial nerve deficit.  Skin: Skin is warm and dry. She is not diaphoretic.  Psychiatric: She has a normal mood and affect. Her behavior is normal. Judgment and thought content normal.  Nursing note and vitals reviewed.   Assessment/Plan: 1. Uncontrolled type 2 diabetes mellitus with hyperglycemia (HCC) - POCT HgB A1C 6.4 today. Continue diabetic medications as prescribed. Reviewed importance of following diabetic diet and participation in routine exercise.   2. Essential hypertension Stable. Continue bp medicatin as prescribed.  - amLODipine (NORVASC) 5 MG tablet; Take 2 tablets (10 mg total) by mouth every evening.  Dispense: 180 tablet; Refill:  4  3. Rheumatoid arthritis involving multiple sites with positive rheumatoid factor (HCC) - folic acid (FOLVITE) 1 MG tablet; Take 2 tablets (2 mg total) by mouth daily.  Dispense: 90 tablet; Refill: 4  4. Screening for colon cancer - Ambulatory referral to Gastroenterology  General Counseling: joelys staubs understanding of the findings of todays visit and agrees with plan of treatment. I have discussed any further diagnostic evaluation that may be needed or ordered today. We also reviewed her medications today. she has been encouraged to call the office with any questions or concerns that should arise related to todays visit.   Diabetes Counseling:  1. Addition of ACE inh/ ARB'S for nephroprotection. 2. Diabetic foot care, prevention of complications.  3.Exercise and lose weight.  4. Diabetic eye examination, 5. Monitor  blood sugar closlely. nutrition counseling.  6.Sign and symptoms of hypoglycemia including shaking sweating,confusion and headaches.  This patient was seen by Leretha Pol, FNP- C in Collaboration with Dr Lavera Guise as a part of collaborative care agreement    Orders Placed This Encounter  Procedures  . Ambulatory referral to Gastroenterology  . POCT HgB A1C    Meds ordered this encounter  Medications  . DISCONTD: amLODipine (NORVASC) 5 MG tablet    Sig: Take 2 tablets (10 mg total) by mouth every evening.    Dispense:  90 tablet    Refill:  4    Order Specific Question:   Supervising Provider    Answer:   Lavera Guise [[id number]]  . folic acid (FOLVITE) 1 MG tablet    Sig: Take 2 tablets (2 mg total) by mouth daily.    Dispense:  90 tablet    Refill:  4    Order Specific Question:   Supervising Provider    Answer:   Lavera Guise [[id number]]  . amLODipine (NORVASC) 5 MG tablet    Sig: Take 2 tablets (10 mg total) by mouth every evening.    Dispense:  180 tablet    Refill:  4    Patient taking 2 tablets. This is correct dosing.    Order Specific Question:   Supervising Provider    Answer:   Lavera Guise [[id number]]    Time spent: 68 Minutes     Dr Lavera Guise Internal medicine

## 2017-07-25 DIAGNOSIS — E1165 Type 2 diabetes mellitus with hyperglycemia: Secondary | ICD-10-CM | POA: Insufficient documentation

## 2017-07-25 LAB — POCT GLYCOSYLATED HEMOGLOBIN (HGB A1C): HEMOGLOBIN A1C: 6.4

## 2017-09-05 DIAGNOSIS — M0579 Rheumatoid arthritis with rheumatoid factor of multiple sites without organ or systems involvement: Secondary | ICD-10-CM | POA: Diagnosis not present

## 2017-09-05 DIAGNOSIS — Z79899 Other long term (current) drug therapy: Secondary | ICD-10-CM | POA: Diagnosis not present

## 2017-09-06 ENCOUNTER — Other Ambulatory Visit: Payer: Self-pay | Admitting: Nurse Practitioner

## 2017-09-06 ENCOUNTER — Other Ambulatory Visit: Payer: Self-pay | Admitting: Internal Medicine

## 2017-09-06 MED ORDER — GLIMEPIRIDE 4 MG PO TABS: ORAL_TABLET | ORAL | 3 refills | Status: DC

## 2017-09-12 DIAGNOSIS — M79641 Pain in right hand: Secondary | ICD-10-CM | POA: Diagnosis not present

## 2017-09-12 DIAGNOSIS — Z79899 Other long term (current) drug therapy: Secondary | ICD-10-CM | POA: Diagnosis not present

## 2017-09-12 DIAGNOSIS — M25562 Pain in left knee: Secondary | ICD-10-CM | POA: Diagnosis not present

## 2017-09-12 DIAGNOSIS — M79644 Pain in right finger(s): Secondary | ICD-10-CM | POA: Diagnosis not present

## 2017-09-12 DIAGNOSIS — M79642 Pain in left hand: Secondary | ICD-10-CM | POA: Diagnosis not present

## 2017-09-12 DIAGNOSIS — G8929 Other chronic pain: Secondary | ICD-10-CM | POA: Diagnosis not present

## 2017-09-12 DIAGNOSIS — E119 Type 2 diabetes mellitus without complications: Secondary | ICD-10-CM | POA: Diagnosis not present

## 2017-09-12 DIAGNOSIS — M0579 Rheumatoid arthritis with rheumatoid factor of multiple sites without organ or systems involvement: Secondary | ICD-10-CM | POA: Diagnosis not present

## 2017-10-03 DIAGNOSIS — R69 Illness, unspecified: Secondary | ICD-10-CM | POA: Diagnosis not present

## 2017-10-04 DIAGNOSIS — R634 Abnormal weight loss: Secondary | ICD-10-CM | POA: Diagnosis not present

## 2017-10-04 DIAGNOSIS — K625 Hemorrhage of anus and rectum: Secondary | ICD-10-CM | POA: Diagnosis not present

## 2017-10-04 DIAGNOSIS — R1013 Epigastric pain: Secondary | ICD-10-CM | POA: Diagnosis not present

## 2017-10-16 ENCOUNTER — Encounter: Payer: Self-pay | Admitting: Internal Medicine

## 2017-10-16 ENCOUNTER — Ambulatory Visit (INDEPENDENT_AMBULATORY_CARE_PROVIDER_SITE_OTHER): Payer: Medicare HMO | Admitting: Internal Medicine

## 2017-10-16 VITALS — BP 159/70 | HR 58 | Resp 16 | Ht 62.0 in | Wt 136.4 lb

## 2017-10-16 DIAGNOSIS — Z1231 Encounter for screening mammogram for malignant neoplasm of breast: Secondary | ICD-10-CM

## 2017-10-16 DIAGNOSIS — Z0001 Encounter for general adult medical examination with abnormal findings: Secondary | ICD-10-CM

## 2017-10-16 DIAGNOSIS — B369 Superficial mycosis, unspecified: Secondary | ICD-10-CM

## 2017-10-16 DIAGNOSIS — I10 Essential (primary) hypertension: Secondary | ICD-10-CM

## 2017-10-16 DIAGNOSIS — E1121 Type 2 diabetes mellitus with diabetic nephropathy: Secondary | ICD-10-CM | POA: Diagnosis not present

## 2017-10-16 DIAGNOSIS — R300 Dysuria: Secondary | ICD-10-CM

## 2017-10-16 DIAGNOSIS — Z1239 Encounter for other screening for malignant neoplasm of breast: Secondary | ICD-10-CM

## 2017-10-16 LAB — POCT GLYCOSYLATED HEMOGLOBIN (HGB A1C): HEMOGLOBIN A1C: 6.6 % — AB (ref 4.0–5.6)

## 2017-10-16 MED ORDER — CLOTRIMAZOLE-BETAMETHASONE 1-0.05 % EX CREA: 1.0000 "application " | TOPICAL_CREAM | Freq: Two times a day (BID) | CUTANEOUS | 0 refills | Status: DC

## 2017-10-16 MED ORDER — AMLODIPINE BESYLATE 5 MG PO TABS: 10.0000 mg | ORAL_TABLET | Freq: Every evening | ORAL | 4 refills | Status: DC

## 2017-10-16 NOTE — Addendum Note (Signed)
Addended by: Edd Arbour on: 10/16/2017 04:49 PM   Modules accepted: Orders

## 2017-10-16 NOTE — Progress Notes (Signed)
[facility] [place], [place] [address]  Internal MEDICINE  Office Visit Note  Patient Name: [name]  [id number]  [id number]  Date of Service: 10/16/2017  Chief Complaint  Patient presents with  . Annual Exam     HPI Pt is here for routine health maintenance examination. Feels well, however has lost weight and is scheduled to have EGD and colonoscopy. Renal diease is stable. Bp is under better control   Current Medication: Outpatient Encounter Medications as of 10/16/2017  Medication Sig Note  . ACCU-CHEK FASTCLIX LANCETS MISC Accu-Chek FastClix Lancing Device   . acetaminophen (TYLENOL) 500 MG tablet Take 500-1,000 mg by mouth every 6 (six) hours as needed.   . Alcohol Swabs (ALCOHOL PREP) PADS alcohol prep pads   . amLODipine (NORVASC) 5 MG tablet Take 2 tablets (10 mg total) by mouth every evening.   Marland Kitchen aspirin EC 81 MG tablet Take 81 mg by mouth daily.   Marland Kitchen atorvastatin (LIPITOR) 10 MG tablet TAKE 1 TABLET AT BEDTIME   FOR HIGH CHOLESTEROL   . feeding supplement, GLUCERNA SHAKE, (GLUCERNA SHAKE) LIQD Take 237 mLs by mouth 3 (three) times daily between meals.   . ferrous sulfate 325 (65 FE) MG EC tablet Take 325 mg by mouth daily.   . folic acid (FOLVITE) 1 MG tablet Take 2 tablets (2 mg total) by mouth daily.   Marland Kitchen glimepiride (AMARYL) 4 MG tablet Take 1/2 tablet with breakfast and 1/2 tablet with dinner   . glucose blood test strip Accu-Chek SmartView Test Strips   . hydrALAZINE (APRESOLINE) 10 MG tablet Take 10 mg by mouth 3 (three) times daily.   . meloxicam (MOBIC) 7.5 MG tablet TAKE 1 TABLET EVERY MORNINGFOR ARTHRITIS   . methotrexate 2.5 MG tablet Take 15 mg by mouth daily. 03/31/2017: Patient takes 6 tablets every Saturday  . pantoprazole (PROTONIX) 40 MG tablet Take 40 mg by mouth daily.   . traMADol (ULTRAM) 50 MG tablet Take 50 mg by mouth 2 (two) times daily.   . [DISCONTINUED] amLODipine (NORVASC) 5 MG tablet Take 2 tablets (10 mg  total) by mouth every evening.    No facility-administered encounter medications on file as of 10/16/2017.     Surgical History: History reviewed. No pertinent surgical history.  Medical History: Past Medical History:  Diagnosis Date  . Arthritis   . Diabetes mellitus without complication ([place])   . GERD (gastroesophageal reflux disease)   . Hypertension     Family History: Family History  Problem Relation Age of Onset  . Breast cancer Neg Hx    Review of Systems  Constitutional: Negative for chills, diaphoresis and fatigue.  HENT: Negative for ear pain, postnasal drip and sinus pressure.   Eyes: Negative for photophobia, discharge, redness, itching and visual disturbance.  Respiratory: Negative for cough, shortness of breath and wheezing.   Cardiovascular: Negative for chest pain, palpitations and leg swelling.  Gastrointestinal: Negative for abdominal pain, constipation, diarrhea, nausea and vomiting.  Genitourinary: Negative for dysuria and flank pain.  Musculoskeletal: Negative for arthralgias, back pain, gait problem and neck pain.  Skin: Negative for color change.  Allergic/Immunologic: Negative for environmental allergies and food allergies.  Neurological: Negative for dizziness and headaches.  Hematological: Does not bruise/bleed easily.  Psychiatric/Behavioral: Negative for agitation, behavioral problems (depression) and hallucinations.   Vital Signs: BP (!) 159/70 (BP Location: Left Arm, Patient Position: Sitting, Cuff Size: Normal)   Pulse (!) 58   Resp 16   Ht  5\' 2"  (1.575 m)   Wt 136 lb 6.4 oz (61.9 kg)   SpO2 95%   BMI 24.95 kg/m    Physical Exam  Constitutional: She is oriented to person, place, and time. She appears well-developed and well-nourished. No distress.  HENT:  Head: Normocephalic and atraumatic.  Mouth/Throat: Oropharynx is clear and moist. No oropharyngeal exudate.  Eyes: Pupils are equal, round, and reactive to light. EOM are normal.    Neck: Normal range of motion. Neck supple. No JVD present. No tracheal deviation present. No thyromegaly present.  Cardiovascular: Normal rate, regular rhythm and normal heart sounds. Exam reveals no gallop and no friction rub.  No murmur heard. Pulmonary/Chest: Effort normal. No respiratory distress. She has no wheezes. She has no rales. She exhibits no tenderness. Right breast exhibits skin change. Right breast exhibits no mass. Left breast exhibits skin change. Left breast exhibits no mass.    Abdominal: Soft. Bowel sounds are normal.  Musculoskeletal: Normal range of motion.  Lymphadenopathy:    She has no cervical adenopathy.  Neurological: She is alert and oriented to person, place, and time. No cranial nerve deficit.  Skin: Skin is warm and dry. She is not diaphoretic.  Psychiatric: She has a normal mood and affect. Her behavior is normal. Judgment and thought content normal.     LABS: Recent Results (from the past 2160 hour(s))  POCT HgB A1C     Status: Abnormal   Collection Time: 07/25/17 12:24 PM  Result Value Ref Range   Hemoglobin A1C 6.4   POCT HgB A1C     Status: Abnormal   Collection Time: 10/16/17  9:23 AM  Result Value Ref Range   Hemoglobin A1C 6.6 (A) 4.0 - 5.6 %   HbA1c, POC (prediabetic range)  5.7 - 6.4 %   HbA1c, POC (controlled diabetic range)  0.0 - 7.0 %    Assessment/Plan: 1. Encounter for general adult medical examination with abnormal findings - Updated eye exam,   2. Diabetic nephropathy associated with type 2 diabetes mellitus (HCC) - POCT HgB A1C - Urinalysis, Routine w reflex microscopic - VAS US RENAL ARTERY DUPLEX; Future - Basic metabolic panel - Microalbumin / creatinine urine ratio - Microalbumin, urine  3. Breast cancer screening - MM DIGITAL SCREENING BILATERAL; Future  4. Essential hypertension - amLODipine (NORVASC) 5 MG tablet; Take 2 tablets (10 mg total) by mouth every evening.  Dispense: 180 tablet; Refill: 4  5. Fungal  dermatitis -  Apply to under skin of bilateral breastsclotrimazole-betamethasone (LOTRISONE) cream; Apply 1 application topically 2 (two) times daily.  Dispense: 30 g; Refill: 0  General Counseling: Kellin verbalizes understanding of the findings of todays visit and agrees with plan of treatment. I have discussed any further diagnostic evaluation that may be needed or ordered today. We also reviewed her medications today. she has been encouraged to call the office with any questions or concerns that should arise related to todays visit.   Orders Placed This Encounter  Procedures  . MM DIGITAL SCREENING BILATERAL  . Urinalysis, Routine w reflex microscopic  . POCT HgB A1C    Meds ordered this encounter  Medications  . amLODipine (NORVASC) 5 MG tablet    Sig: Take 2 tablets (10 mg total) by mouth every evening.    Dispense:  180 tablet    Refill:  4    Patient taking 2 tablets. This is correct dosing.    Time spent:25Minutes  Lavera Guise, MD  Internal Medicine

## 2017-10-17 LAB — MICROALBUMIN / CREATININE URINE RATIO
CREATININE, UR: 148.5 mg/dL
MICROALB/CREAT RATIO: 301.2 mg/g{creat} — AB (ref 0.0–30.0)
MICROALBUM., U, RANDOM: 447.3 ug/mL

## 2017-10-17 LAB — MICROSCOPIC EXAMINATION: CASTS: NONE SEEN /LPF

## 2017-10-17 LAB — URINALYSIS, ROUTINE W REFLEX MICROSCOPIC
BILIRUBIN UA: NEGATIVE
Glucose, UA: NEGATIVE
KETONES UA: NEGATIVE
Leukocytes, UA: NEGATIVE
Nitrite, UA: NEGATIVE
PH UA: 5.5 (ref 5.0–7.5)
RBC UA: NEGATIVE
Specific Gravity, UA: 1.018 (ref 1.005–1.030)
UUROB: 0.2 mg/dL (ref 0.2–1.0)

## 2017-10-17 LAB — BASIC METABOLIC PANEL
BUN/Creatinine Ratio: 13 (ref 12–28)
BUN: 12 mg/dL (ref 8–27)
CO2: 26 mmol/L (ref 20–29)
Calcium: 9.2 mg/dL (ref 8.7–10.3)
Chloride: 100 mmol/L (ref 96–106)
Creatinine, Ser: 0.93 mg/dL (ref 0.57–1.00)
GFR calc Af Amer: 68 mL/min/{1.73_m2} (ref >59)
GFR, EST NON AFRICAN AMERICAN: 59 mL/min/{1.73_m2} — AB (ref >59)
GLUCOSE: 110 mg/dL — AB (ref 65–99)
POTASSIUM: 3.9 mmol/L (ref 3.5–5.2)
SODIUM: 143 mmol/L (ref 134–144)

## 2017-10-24 ENCOUNTER — Encounter: Payer: Self-pay | Admitting: *Deleted

## 2017-10-25 ENCOUNTER — Ambulatory Visit: Payer: Medicare HMO | Admitting: Anesthesiology

## 2017-10-25 ENCOUNTER — Encounter
Admission: RE | Disposition: A | Discharge status: Home / Self Care | Payer: Self-pay | Source: Ambulatory Visit | Attending: Gastroenterology

## 2017-10-25 ENCOUNTER — Encounter: Payer: Self-pay | Admitting: *Deleted

## 2017-10-25 ENCOUNTER — Ambulatory Visit
Admission: RE | Admit: 2017-10-25 | Discharge: 2017-10-25 | Disposition: A | Discharge status: Home / Self Care | Payer: Medicare HMO | Source: Ambulatory Visit | Attending: Gastroenterology | Admitting: Gastroenterology

## 2017-10-25 DIAGNOSIS — K2950 Unspecified chronic gastritis without bleeding: Secondary | ICD-10-CM | POA: Diagnosis not present

## 2017-10-25 DIAGNOSIS — I10 Essential (primary) hypertension: Secondary | ICD-10-CM | POA: Diagnosis not present

## 2017-10-25 DIAGNOSIS — K625 Hemorrhage of anus and rectum: Secondary | ICD-10-CM | POA: Diagnosis not present

## 2017-10-25 DIAGNOSIS — Z7982 Long term (current) use of aspirin: Secondary | ICD-10-CM | POA: Insufficient documentation

## 2017-10-25 DIAGNOSIS — K5730 Diverticulosis of large intestine without perforation or abscess without bleeding: Secondary | ICD-10-CM | POA: Diagnosis not present

## 2017-10-25 DIAGNOSIS — K317 Polyp of stomach and duodenum: Secondary | ICD-10-CM | POA: Diagnosis not present

## 2017-10-25 DIAGNOSIS — K648 Other hemorrhoids: Secondary | ICD-10-CM | POA: Diagnosis not present

## 2017-10-25 DIAGNOSIS — K219 Gastro-esophageal reflux disease without esophagitis: Secondary | ICD-10-CM | POA: Insufficient documentation

## 2017-10-25 DIAGNOSIS — R634 Abnormal weight loss: Secondary | ICD-10-CM | POA: Insufficient documentation

## 2017-10-25 DIAGNOSIS — R1310 Dysphagia, unspecified: Secondary | ICD-10-CM | POA: Diagnosis not present

## 2017-10-25 DIAGNOSIS — E119 Type 2 diabetes mellitus without complications: Secondary | ICD-10-CM | POA: Insufficient documentation

## 2017-10-25 DIAGNOSIS — R6881 Early satiety: Secondary | ICD-10-CM | POA: Insufficient documentation

## 2017-10-25 DIAGNOSIS — K449 Diaphragmatic hernia without obstruction or gangrene: Secondary | ICD-10-CM | POA: Insufficient documentation

## 2017-10-25 DIAGNOSIS — M1990 Unspecified osteoarthritis, unspecified site: Secondary | ICD-10-CM | POA: Insufficient documentation

## 2017-10-25 DIAGNOSIS — K5790 Diverticulosis of intestine, part unspecified, without perforation or abscess without bleeding: Secondary | ICD-10-CM | POA: Diagnosis not present

## 2017-10-25 DIAGNOSIS — K640 First degree hemorrhoids: Secondary | ICD-10-CM | POA: Diagnosis not present

## 2017-10-25 DIAGNOSIS — Z79899 Other long term (current) drug therapy: Secondary | ICD-10-CM | POA: Diagnosis not present

## 2017-10-25 DIAGNOSIS — K228 Other specified diseases of esophagus: Secondary | ICD-10-CM | POA: Diagnosis not present

## 2017-10-25 DIAGNOSIS — K3189 Other diseases of stomach and duodenum: Secondary | ICD-10-CM | POA: Diagnosis not present

## 2017-10-25 DIAGNOSIS — D130 Benign neoplasm of esophagus: Secondary | ICD-10-CM | POA: Diagnosis not present

## 2017-10-25 HISTORY — PX: ESOPHAGOGASTRODUODENOSCOPY (EGD) WITH PROPOFOL: SHX5813

## 2017-10-25 HISTORY — PX: COLONOSCOPY WITH PROPOFOL: SHX5780

## 2017-10-25 SURGERY — COLONOSCOPY WITH PROPOFOL
Anesthesia: General

## 2017-10-25 MED ORDER — PROPOFOL 10 MG/ML IV BOLUS
INTRAVENOUS | Status: AC
  Filled 2017-10-25: qty 20

## 2017-10-25 MED ORDER — FENTANYL CITRATE (PF) 100 MCG/2ML IJ SOLN
INTRAMUSCULAR | Status: DC | PRN
  Administered 2017-10-25 (×2): 50 ug via INTRAVENOUS

## 2017-10-25 MED ORDER — SODIUM CHLORIDE 0.9 % IV SOLN
INTRAVENOUS | Status: DC
  Administered 2017-10-25: 11:00:00 via INTRAVENOUS

## 2017-10-25 MED ORDER — FENTANYL CITRATE (PF) 100 MCG/2ML IJ SOLN
INTRAMUSCULAR | Status: AC
  Filled 2017-10-25: qty 2

## 2017-10-25 MED ORDER — LIDOCAINE HCL (CARDIAC) PF 100 MG/5ML IV SOSY
PREFILLED_SYRINGE | INTRAVENOUS | Status: DC | PRN
  Administered 2017-10-25: 30 mg via INTRAVENOUS

## 2017-10-25 MED ORDER — PROPOFOL 500 MG/50ML IV EMUL
INTRAVENOUS | Status: AC
  Filled 2017-10-25: qty 50

## 2017-10-25 MED ORDER — MIDAZOLAM HCL 2 MG/2ML IJ SOLN
INTRAMUSCULAR | Status: AC
  Filled 2017-10-25: qty 2

## 2017-10-25 MED ORDER — EPHEDRINE SULFATE 50 MG/ML IJ SOLN
INTRAMUSCULAR | Status: AC
  Filled 2017-10-25: qty 1

## 2017-10-25 MED ORDER — MIDAZOLAM HCL 2 MG/2ML IJ SOLN
INTRAMUSCULAR | Status: DC | PRN
  Administered 2017-10-25 (×2): 1 mg via INTRAVENOUS

## 2017-10-25 MED ORDER — LIDOCAINE HCL (PF) 2 % IJ SOLN
INTRAMUSCULAR | Status: AC
  Filled 2017-10-25: qty 10

## 2017-10-25 MED ORDER — PROPOFOL 500 MG/50ML IV EMUL
INTRAVENOUS | Status: DC | PRN
  Administered 2017-10-25: 120 ug/kg/min via INTRAVENOUS

## 2017-10-25 MED ORDER — PHENYLEPHRINE HCL 10 MG/ML IJ SOLN
INTRAMUSCULAR | Status: DC | PRN
  Administered 2017-10-25: 50 ug via INTRAVENOUS

## 2017-10-25 MED ORDER — EPHEDRINE SULFATE 50 MG/ML IJ SOLN
INTRAMUSCULAR | Status: DC | PRN
  Administered 2017-10-25: 5 mg via INTRAVENOUS
  Administered 2017-10-25: 10 mg via INTRAVENOUS
  Administered 2017-10-25: 5 mg via INTRAVENOUS
  Administered 2017-10-25: 10 mg via INTRAVENOUS
  Administered 2017-10-25: 5 mg via INTRAVENOUS

## 2017-10-25 NOTE — Anesthesia Procedure Notes (Signed)
Performed by: Cook-Martin, Cisco Kindt Pre-anesthesia Checklist: Patient identified, Emergency Drugs available, Suction available, Patient being monitored and Timeout performed Patient Re-evaluated:Patient Re-evaluated prior to induction Oxygen Delivery Method: Nasal cannula Preoxygenation: Pre-oxygenation with 100% oxygen Induction Type: IV induction Airway Equipment and Method: Bite block Placement Confirmation: positive ETCO2 and CO2 detector       

## 2017-10-25 NOTE — Op Note (Signed)
[facility] Gastroenterology Patient Name: [name] Procedure Date: 10/25/2017 10:30 AM MRN: [id number] Account #: [id number] Date of Birth: 05-15-1941 Admit Type: Outpatient Age: 77 Room: [facility] ENDO ROOM 1 Gender: Female Note Status: Finalized Procedure:            Upper GI endoscopy Indications:          Dyspepsia, Weight loss Providers:            Lollie Sails, MD Referring MD:         Lavera Guise, MD (Referring MD) Medicines:            Monitored Anesthesia Care Complications:        No immediate complications. Procedure:            Pre-Anesthesia Assessment:                       - ASA Grade Assessment: III - A patient with severe                        systemic disease.                       After obtaining informed consent, the endoscope was                        passed under direct vision. Throughout the procedure,                        the patient's blood pressure, pulse, and oxygen                        saturations were monitored continuously. The Endoscope                        was introduced through the mouth, and advanced to the                        third part of duodenum. The upper GI endoscopy was                        accomplished without difficulty. The patient tolerated                        the procedure well. Findings:      The Z-line was variable. Biopsies were taken with a cold forceps for       histology.      A medium-sized hiatal hernia was found. The Z-line was a variable       distance from incisors; the hiatal hernia was sliding. Small venous lake       versus possible varix in the distal esophagus about 1-2 cm above the GE       junction. No other evidence of varices noted.      A single 18 mm submucosal papule (nodule) with no bleeding and no       stigmata of recent bleeding was found on the posterior wall of the       gastric antrum. This had overlying erythema. Very firm to biopsy.       Biopsies were taken  with a cold forceps for histology.      The cardia and gastric fundus were  normal on retroflexion otherwise.      The exam of the stomach was otherwise normal.      Diffuse mild mucosal variance characterized by smoothness was found in       the entire duodenum. Biopsies were taken with a cold forceps for       histology.      Localized mild inflammation characterized by congestion (edema) and       erythema was found in the cardia. Biopsies were taken with a cold       forceps for histology.      Multiple 1 to 4 mm sessile polyps with no bleeding and no stigmata of       recent bleeding were found in the gastric body. Biopsies were taken with       a cold forceps for histology.      A single 4 mm pedunculated polyp with no bleeding and no stigmata of       recent bleeding was found in the cardia. The polyp was removed with a       cold biopsy forceps. Resection and retrieval were complete.      A single 2 mm polyp with no bleeding was found 22 cm from the incisors.       The polyp was removed with a cold biopsy forceps. Resection and       retrieval were complete. Impression:           - Z-line variable. Biopsied.                       - Medium-sized hiatal hernia.                       - A single submucosal papule (nodule) found in the                        stomach. Biopsied.                       - Mucosal variant in the duodenum. Biopsied. Recommendation:       - Continue present medications.                       - Await pathology results.                       - Return to GI clinic in 3 weeks. Procedure Code(s):    --- Professional ---                       [id number], Esophagogastroduodenoscopy, flexible, transoral;                        with biopsy, single or multiple Diagnosis Code(s):    --- Professional ---                       K22.8, Other specified diseases of esophagus                       K44.9, Diaphragmatic hernia without obstruction or                         gangrene  K31.89, Other diseases of stomach and duodenum                       R10.13, Epigastric pain                       R63.4, Abnormal weight loss CPT copyright 2017 [employer]. All rights reserved. The codes documented in this report are preliminary and upon coder review may  be revised to meet current compliance requirements. Lollie Sails, MD 10/25/2017 11:31:29 AM This report has been signed electronically. Number of Addenda: 0 Note Initiated On: 10/25/2017 10:30 AM      [facility]

## 2017-10-25 NOTE — Op Note (Signed)
[facility] Patient Name: [name] Procedure Date: 10/25/2017 10:29 AM MRN: [id number] Account #: [id number] Date of Birth: 1941/03/29 Admit Type: Outpatient Age: 77 Room: [facility] ENDO ROOM 1 Gender: Female Note Status: Finalized Procedure:            Colonoscopy Indications:          Rectal bleeding, Weight loss Providers:            Lollie Sails, MD Referring MD:         Lavera Guise, MD (Referring MD) Medicines:            Monitored Anesthesia Care Complications:        No immediate complications. Procedure:            Pre-Anesthesia Assessment:                       - ASA Grade Assessment: III - A patient with severe                        systemic disease.                       After obtaining informed consent, the colonoscope was                        passed under direct vision. Throughout the procedure,                        the patient's blood pressure, pulse, and oxygen                        saturations were monitored continuously. The                        Colonoscope was introduced through the anus and                        advanced to the the cecum, identified by appendiceal                        orifice and ileocecal valve. The colonoscopy was                        performed with moderate difficulty due to restricted                        mobility of the colon and a tortuous colon. Successful                        completion of the procedure was aided by changing the                        patient to a supine position, changing the patient to a                        prone position and using manual pressure. The patient                        tolerated the procedure well. The quality of the bowel  preparation was good. Findings:      Many small and large-mouthed diverticula were found in the sigmoid       colon, descending colon and transverse colon.      Non-bleeding internal hemorrhoids were  found during anoscopy. The       hemorrhoids were small and Grade I (internal hemorrhoids that do not       prolapse).      The digital rectal exam was normal. Impression:           - Diverticulosis in the sigmoid colon, in the                        descending colon and in the transverse colon.                       - Non-bleeding internal hemorrhoids.                       - No specimens collected. Recommendation:       - Discharge patient to home.                       - Return to GI clinic in 1 month. Procedure Code(s):    --- Professional ---                       [phone], Colonoscopy, flexible; diagnostic, including                        collection of specimen(s) by brushing or washing, when                        performed (separate procedure) Diagnosis Code(s):    --- Professional ---                       K64.0, First degree hemorrhoids                       K62.5, Hemorrhage of anus and rectum                       R63.4, Abnormal weight loss                       K57.30, Diverticulosis of large intestine without                        perforation or abscess without bleeding CPT copyright 2017 American [employer]. All rights reserved. The codes documented in this report are preliminary and upon coder review may  be revised to meet current compliance requirements. Lollie Sails, MD 10/25/2017 12:02:14 PM This report has been signed electronically. Number of Addenda: 0 Note Initiated On: 10/25/2017 10:29 AM Scope Withdrawal Time: 0 hours 6 minutes 20 seconds  Total Procedure Duration: 0 hours 24 minutes 50 seconds       [facility]

## 2017-10-25 NOTE — H&P (Signed)
Outpatient short stay form Pre-procedure 10/25/2017 10:29 AM Lollie Sails MD  Primary Physician: Dr. Clayborn Bigness  Reason for visit: EGD and colonoscopy  History of present illness: Patient is a 77 year old female presenting today as above.  Has had some recent problems with dyspepsia, weight loss and some rectal bleeding.  Does take meloxicam as well as Protonix.  She has had some problems with reflux early satiety and a weight loss of about 30 pounds over the past 7 to 8 months.  Appetite is not been is good.  Her last colonoscopy was in 2009.  She did have an EGD for an upper GI bleed in June 2010.  The gastric ulcers at that time.  Just recently started the Protonix.  Tolerating her prep well.  She does take a daily 81 mg aspirin but is held that for several days.  She takes no other aspirin products or blood thinning agents.      Current Facility-Administered Medications:  .  0.9 %  sodium chloride infusion, , Intravenous, Continuous, Lollie Sails, MD  Medications Prior to Admission  Medication Sig Dispense Refill Last Dose  . amLODipine (NORVASC) 5 MG tablet Take 2 tablets (10 mg total) by mouth every evening. 180 tablet 4 10/25/2017 at Unknown time  . hydrALAZINE (APRESOLINE) 10 MG tablet Take 10 mg by mouth 3 (three) times daily.   10/25/2017 at Unknown time  . pantoprazole (PROTONIX) 40 MG tablet Take 40 mg by mouth daily.   10/25/2017 at Unknown time  . ACCU-CHEK FASTCLIX LANCETS MISC Accu-Chek FastClix Lancing Device   Taking  . acetaminophen (TYLENOL) 500 MG tablet Take 500-1,000 mg by mouth every 6 (six) hours as needed.   Taking  . Alcohol Swabs (ALCOHOL PREP) PADS alcohol prep pads   Taking  . aspirin EC 81 MG tablet Take 81 mg by mouth daily.   Taking  . atorvastatin (LIPITOR) 10 MG tablet TAKE 1 TABLET AT BEDTIME   FOR HIGH CHOLESTEROL 90 tablet 3 Taking  . clotrimazole-betamethasone (LOTRISONE) cream Apply 1 application topically 2 (two) times daily. 30 g 0   .  feeding supplement, GLUCERNA SHAKE, (GLUCERNA SHAKE) LIQD Take 237 mLs by mouth 3 (three) times daily between meals.   Taking  . ferrous sulfate 325 (65 FE) MG EC tablet Take 325 mg by mouth daily.   Taking  . folic acid (FOLVITE) 1 MG tablet Take 2 tablets (2 mg total) by mouth daily. 90 tablet 4 Taking  . glimepiride (AMARYL) 4 MG tablet Take 1/2 tablet with breakfast and 1/2 tablet with dinner 90 tablet 3 Taking  . glucose blood test strip Accu-Chek SmartView Test Strips   Taking  . meloxicam (MOBIC) 7.5 MG tablet TAKE 1 TABLET EVERY MORNINGFOR ARTHRITIS 90 tablet 1 Taking  . methotrexate 2.5 MG tablet Take 15 mg by mouth daily.   Taking  . traMADol (ULTRAM) 50 MG tablet Take 50 mg by mouth 2 (two) times daily.   Taking     No Known Allergies   Past Medical History:  Diagnosis Date  . Arthritis   . Diabetes mellitus without complication ([place])   . GERD (gastroesophageal reflux disease)   . Hypertension     Review of systems:      Physical Exam    Heart and lungs: Without rub or gallop, lungs are bilaterally clear.    HEENT: Normocephalic atraumatic eyes are anicteric    Other:    Pertinant exam for procedure: Soft nontender nondistended bowel sounds  positive normoactive    Planned proceedures: EGD, colonoscopy and indicated procedures. I have discussed the risks benefits and complications of procedures to include not limited to bleeding, infection, perforation and the risk of sedation and the patient wishes to proceed.    Lollie Sails, MD Gastroenterology 10/25/2017  10:29 AM

## 2017-10-25 NOTE — Transfer of Care (Signed)
Immediate Anesthesia Transfer of Care Note  Patient: [name]  Procedure(s) Performed: COLONOSCOPY WITH PROPOFOL (N/A ) ESOPHAGOGASTRODUODENOSCOPY (EGD) WITH PROPOFOL (N/A )  Patient Location: PACU  Anesthesia Type:General  Level of Consciousness: awake and sedated  Airway & Oxygen Therapy: Patient Spontanous Breathing and Patient connected to nasal cannula oxygen  Post-op Assessment: Report given to RN and Post -op Vital signs reviewed and stable  Post vital signs: Reviewed and stable  Last Vitals:  Vitals Value Taken Time  BP    Temp    Pulse    Resp    SpO2      Last Pain:  Vitals:   10/25/17 1028  TempSrc: Tympanic  PainSc: 0-No pain         Complications: No apparent anesthesia complications

## 2017-10-25 NOTE — Anesthesia Preprocedure Evaluation (Signed)
Anesthesia Evaluation  Patient identified by MRN, date of birth, ID band Patient awake    Reviewed: Allergy & Precautions, H&P , NPO status , Patient's Chart, lab work & pertinent test results  History of Anesthesia Complications Negative for: history of anesthetic complications  Airway Mallampati: III  TM Distance: <3 FB Neck ROM: limited    Dental  (+) Poor Dentition, Missing, Upper Dentures, Edentulous Lower, Edentulous Upper, Lower Dentures   Pulmonary neg pulmonary ROS, neg shortness of breath,           Cardiovascular Exercise Tolerance: Good hypertension, (-) angina(-) Past MI and (-) DOE      Neuro/Psych negative neurological ROS  negative psych ROS   GI/Hepatic Neg liver ROS, GERD  Medicated and Controlled,  Endo/Other  diabetes, Type 2  Renal/GU negative Renal ROS  negative genitourinary   Musculoskeletal   Abdominal   Peds  Hematology negative hematology ROS (+)   Anesthesia Other Findings Past Medical History: No date: Arthritis No date: Diabetes mellitus without complication (HCC) No date: GERD (gastroesophageal reflux disease) No date: Hypertension  Past Surgical History: No date: ABDOMINAL HYSTERECTOMY No date: right side had fatty tissue taken off No date: TONSILLECTOMY  BMI    Body Mass Index:  24.87 kg/m      Reproductive/Obstetrics negative OB ROS                             Anesthesia Physical Anesthesia Plan  ASA: III  Anesthesia Plan: General   Post-op Pain Management:    Induction: Intravenous  PONV Risk Score and Plan: Propofol infusion and TIVA  Airway Management Planned: Natural Airway and Nasal Cannula  Additional Equipment:   Intra-op Plan:   Post-operative Plan:   Informed Consent: I have reviewed the patients History and Physical, chart, labs and discussed the procedure including the risks, benefits and alternatives for the proposed  anesthesia with the patient or authorized representative who has indicated his/her understanding and acceptance.   Dental Advisory Given  Plan Discussed with: Anesthesiologist, CRNA and Surgeon  Anesthesia Plan Comments: (Patient consented for risks of anesthesia including but not limited to:  - adverse reactions to medications - risk of intubation if required - damage to teeth, lips or other oral mucosa - sore throat or hoarseness - Damage to heart, brain, lungs or loss of life  Patient voiced understanding.)        Anesthesia Quick Evaluation

## 2017-10-25 NOTE — Anesthesia Post-op Follow-up Note (Signed)
Anesthesia QCDR form completed.        

## 2017-10-26 DIAGNOSIS — H40003 Preglaucoma, unspecified, bilateral: Secondary | ICD-10-CM | POA: Diagnosis not present

## 2017-10-26 LAB — SURGICAL PATHOLOGY

## 2017-10-29 DIAGNOSIS — H40003 Preglaucoma, unspecified, bilateral: Secondary | ICD-10-CM | POA: Diagnosis not present

## 2017-10-29 DIAGNOSIS — E113293 Type 2 diabetes mellitus with mild nonproliferative diabetic retinopathy without macular edema, bilateral: Secondary | ICD-10-CM | POA: Diagnosis not present

## 2017-10-29 NOTE — Anesthesia Postprocedure Evaluation (Signed)
Anesthesia Post Note  Patient: [name]  Procedure(s) Performed: COLONOSCOPY WITH PROPOFOL (N/A ) ESOPHAGOGASTRODUODENOSCOPY (EGD) WITH PROPOFOL (N/A )  Patient location during evaluation: PACU Anesthesia Type: General Level of consciousness: awake and alert Pain management: pain level controlled Vital Signs Assessment: post-procedure vital signs reviewed and stable Respiratory status: spontaneous breathing, nonlabored ventilation, respiratory function stable and patient connected to nasal cannula oxygen Cardiovascular status: blood pressure returned to baseline and stable Postop Assessment: no apparent nausea or vomiting Anesthetic complications: no     Last Vitals:  Vitals:   10/25/17 1223 10/25/17 1233  BP: 132/66 (!) 127/55  Pulse:    Resp: (!) 23 16  Temp:    SpO2: 98% 99%    Last Pain:  Vitals:   10/26/17 0735  TempSrc:   PainSc: 0-No pain                 Molli Barrows

## 2017-11-08 IMAGING — US US EXTREM LOW VENOUS*R*
1 series · 13 of 24 positions shown · non-contrast
Comparison: None.

CLINICAL DATA: Right lower extremity pain for 3 weeks.



[Series 1: us extrem low venous*right* · 0.07mm/px · 13 of 34 slices shown]
[im 1/34]
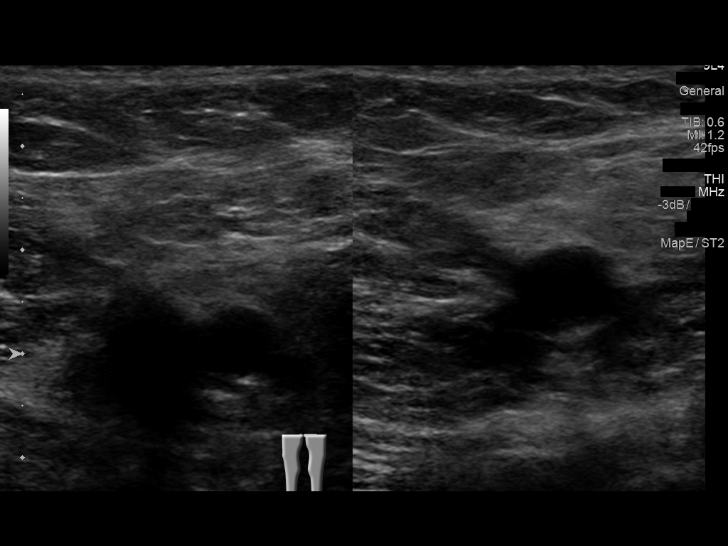
[im 3/34]
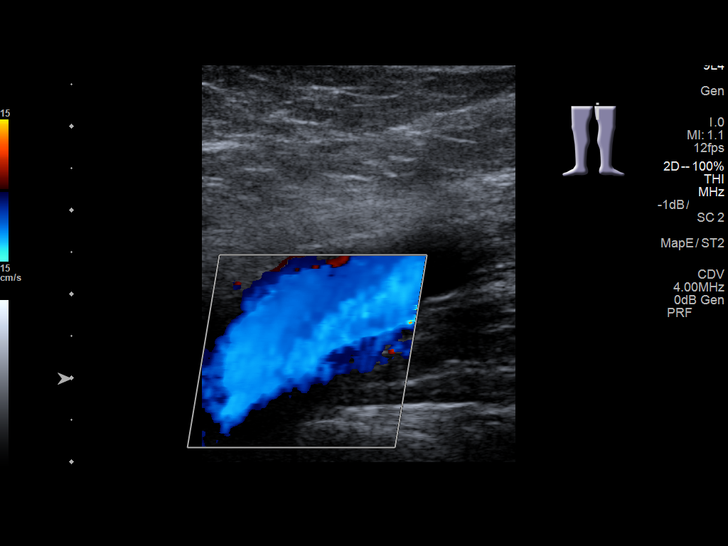
[im 6/34]
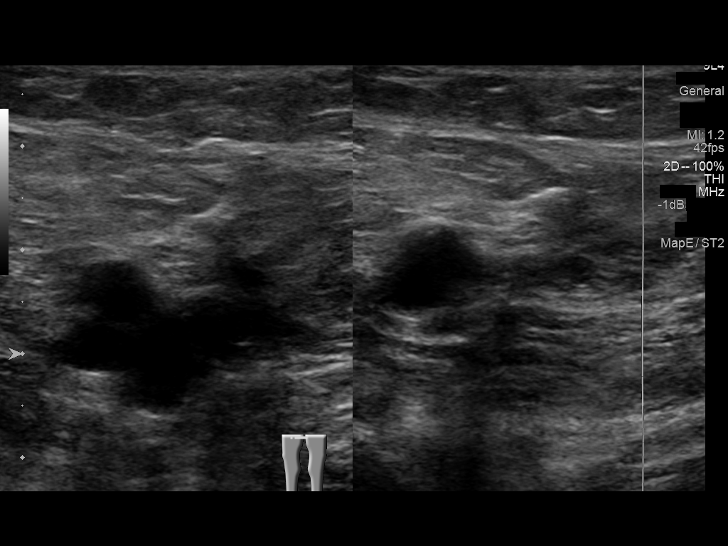
[im 9/34]
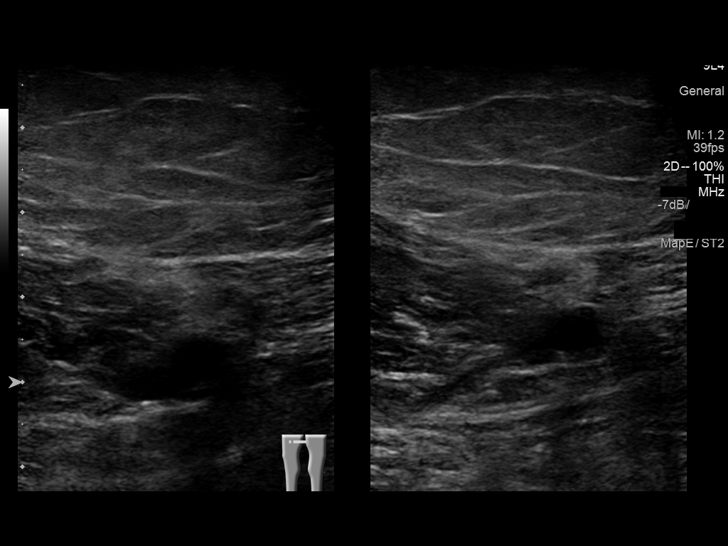
[im 12/34]
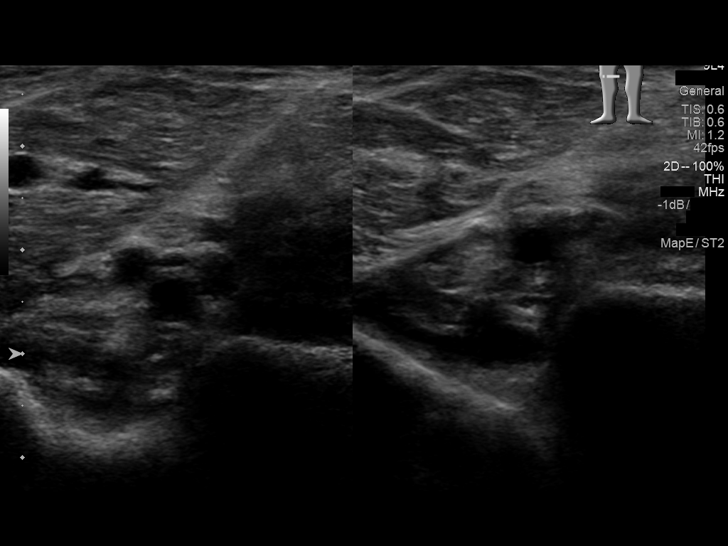
[im 15/34]
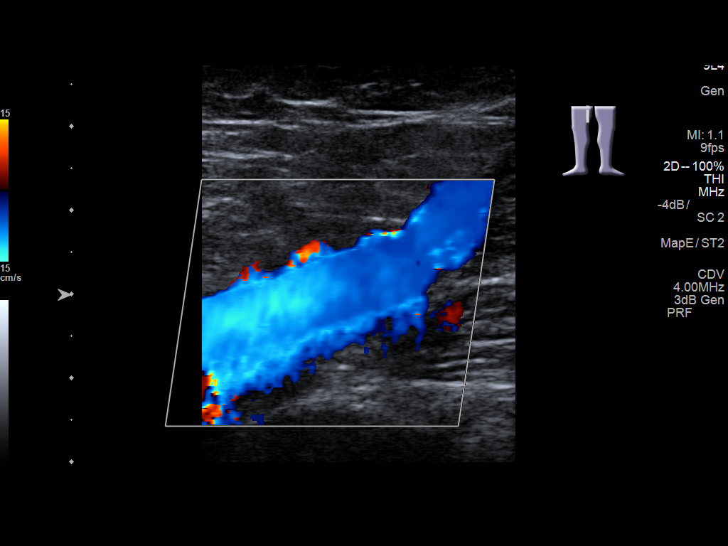
[im 18/34]
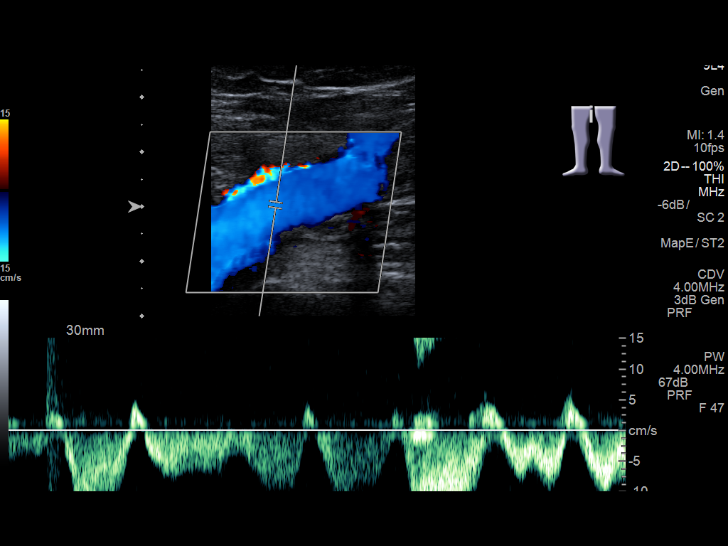
[im 19/34]
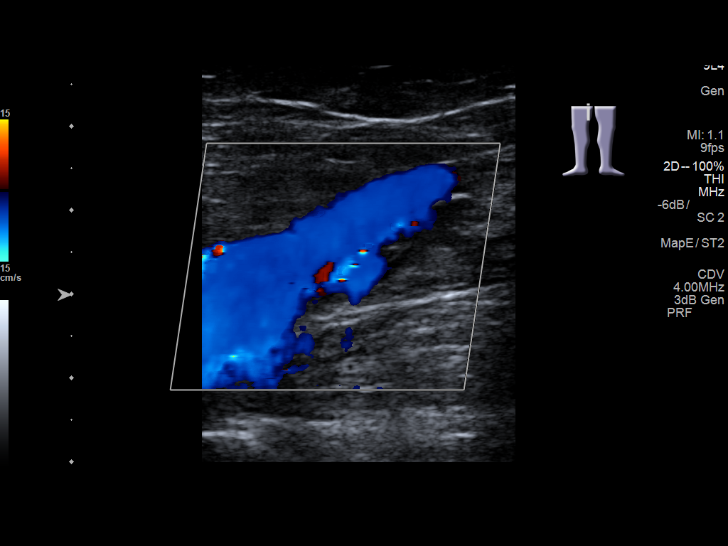
[im 22/34]
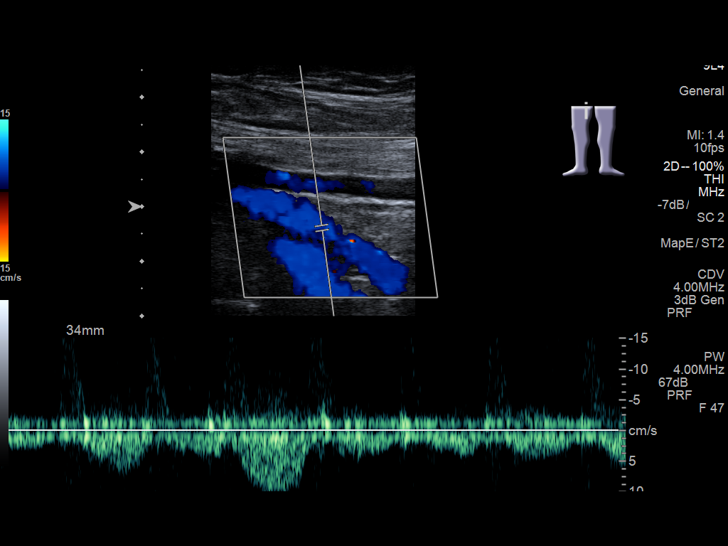
[im 25/34]
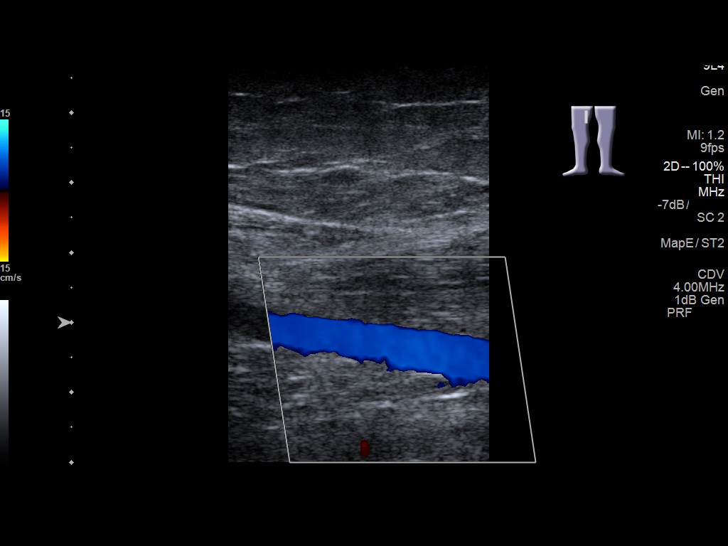
[im 28/34]
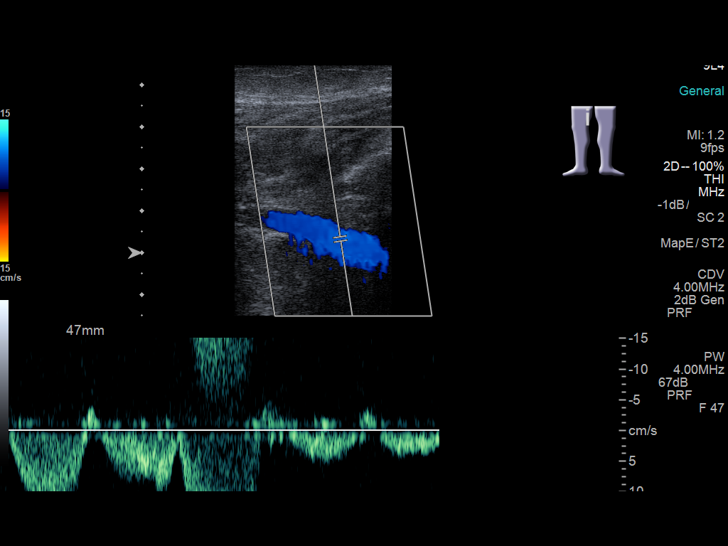
[im 31/34]
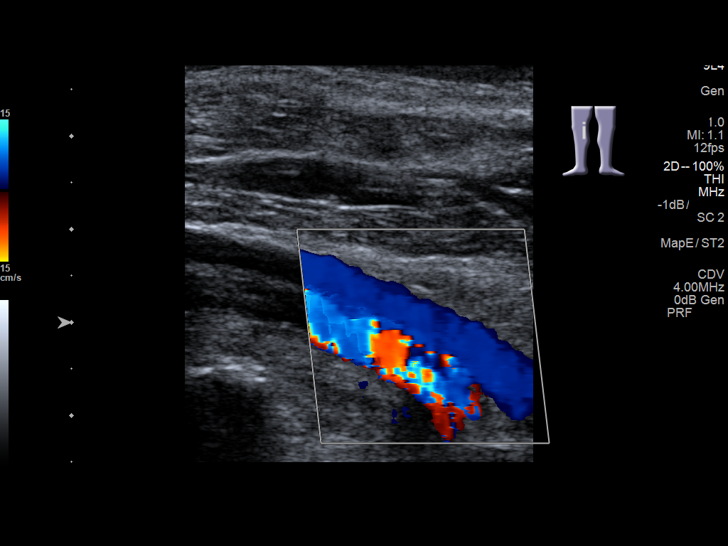
[im 34/34]
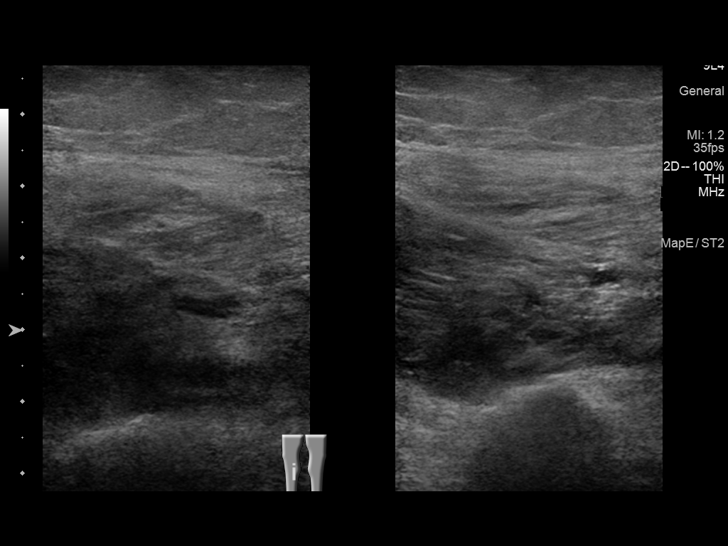

[13 of 24 positions shown; findings below may reference images not displayed]

FINDINGS: Contralateral Common Femoral Vein: Respiratory phasicity is normal
and symmetric with the symptomatic side. No evidence of thrombus.
Normal compressibility.

Common Femoral Vein: No evidence of thrombus. Normal
compressibility, respiratory phasicity and response to augmentation.

Saphenofemoral Junction: No evidence of thrombus. Normal
compressibility and flow on color Doppler imaging.

Profunda Femoral Vein: No evidence of thrombus. Normal
compressibility and flow on color Doppler imaging.

Femoral Vein: No evidence of thrombus. Normal compressibility,
respiratory phasicity and response to augmentation.

Popliteal Vein: No evidence of thrombus. Normal compressibility,
respiratory phasicity and response to augmentation.

Calf Veins: No evidence of thrombus. Normal compressibility and flow
on color Doppler imaging.

Superficial Great Saphenous Vein: No evidence of thrombus. Normal
compressibility and flow on color Doppler imaging.

Venous Reflux:  None.

Other Findings:  None.
IMPRESSION: No evidence of deep venous thrombosis seen in right lower extremity.

## 2017-11-12 ENCOUNTER — Telehealth: Payer: Self-pay

## 2017-11-12 NOTE — Telephone Encounter (Signed)
Received call from [name]. EUS procedure explained. All questions answered. Next available EUS date of 7/25 offered. She was unable to do this date and EUS was arranged at her request for 8/22 with Dr. Cephas Darby. Went over instructions and copy of instructions mailed to home address. Provided my contact information for any future questions. INSTRUCTIONS FOR ENDOSCOPIC ULTRASOUND -Your procedure has been scheduled for August 22nd with Dr. Cephas Darby at [facility]. -The hospital may contact you to pre-register over the phone.  -To get your scheduled arrival time, please call the Endoscopy unit at  [phone] between 1-3 p.m. on:  August 21st   -ON THE DAY OF YOU PROCEDURE:   1. If you are scheduled for a morning procedure, nothing to drink after midnight  -If you are scheduled for an afternoon procedure, you may have clear liquids until 5 hours prior  to the procedure but no carbonated drinks or broth  2. NO FOOD THE DAY OF YOUR PROCEDURE  3. You may take your heart, seizure, blood pressure, Parkinson's or breathing medications at  6am with just enough water to get your pills down  4. Do not take any oral Diabetic medications the morning of your procedure.  5. If you are a diabetic and are using insulin, please notify your prescribing physician of this  procedure, as your dose may need to be altered related to not being able to eat or drink.   6. Do not take vitamins, iron, or fish oil for 5 days before your procedure     -On the day of your procedure, come to the [facility] Admitting/Registration desk (First desk on the right) at the scheduled arrival time. You MUST have someone drive you home from your procedure. You must have a responsible adult with a valid driver's license who is on site throughout your entire procedure and who can stay with you for several hours after your procedure. You may not go home alone in a taxi, shuttle [place] or bus, as the drivers will not be  responsible for you.  --If you have any questions please call me at the above contact     Oncology Nurse Navigator Documentation  Navigator Location: [facility] (11/12/17 1200)   )Navigator Encounter Type: Telephone (11/12/17 1200) Telephone: [place] Call (11/12/17 1200)                       Barriers/Navigation Needs: Coordination of Care (11/12/17 1200)   Interventions: Coordination of Care (11/12/17 1200)   Coordination of Care: EUS (11/12/17 1200)                  Time Spent with Patient: 30 (11/12/17 1200)

## 2017-11-12 NOTE — Telephone Encounter (Signed)
Received referral from Stephens November, NP for EUS. Voicemail left with Ms. Luepke to return call for scheduling. Oncology Nurse Navigator Documentation  Navigator Location: [facility] (11/12/17 1100)   )Navigator Encounter Type: Telephone (11/12/17 1100) Telephone: [place] Call (11/12/17 1100)                       Barriers/Navigation Needs: Coordination of Care (11/12/17 1100)   Interventions: Coordination of Care (11/12/17 1100)   Coordination of Care: EUS (11/12/17 1100)                  Time Spent with Patient: 15 (11/12/17 1100)

## 2017-11-16 ENCOUNTER — Other Ambulatory Visit: Payer: Self-pay

## 2017-12-12 DIAGNOSIS — Z79899 Other long term (current) drug therapy: Secondary | ICD-10-CM | POA: Diagnosis not present

## 2017-12-12 DIAGNOSIS — M0579 Rheumatoid arthritis with rheumatoid factor of multiple sites without organ or systems involvement: Secondary | ICD-10-CM | POA: Diagnosis not present

## 2017-12-13 ENCOUNTER — Other Ambulatory Visit: Payer: Self-pay | Admitting: Internal Medicine

## 2017-12-13 DIAGNOSIS — M0579 Rheumatoid arthritis with rheumatoid factor of multiple sites without organ or systems involvement: Secondary | ICD-10-CM

## 2017-12-13 DIAGNOSIS — R69 Illness, unspecified: Secondary | ICD-10-CM | POA: Diagnosis not present

## 2017-12-13 DIAGNOSIS — I10 Essential (primary) hypertension: Secondary | ICD-10-CM

## 2017-12-13 MED ORDER — GLUCOSE BLOOD VI STRP: ORAL_STRIP | 11 refills | Status: DC

## 2017-12-13 MED ORDER — PANTOPRAZOLE SODIUM 40 MG PO TBEC: 40.0000 mg | DELAYED_RELEASE_TABLET | Freq: Every day | ORAL | 2 refills | Status: DC

## 2017-12-13 MED ORDER — FOLIC ACID 1 MG PO TABS: 2.0000 mg | ORAL_TABLET | Freq: Every day | ORAL | 4 refills | Status: DC

## 2017-12-13 MED ORDER — HYDRALAZINE HCL 10 MG PO TABS: 10.0000 mg | ORAL_TABLET | Freq: Three times a day (TID) | ORAL | 3 refills | Status: DC

## 2017-12-13 MED ORDER — GLIMEPIRIDE 4 MG PO TABS
ORAL_TABLET | ORAL | 3 refills | Status: DC
Start: 2017-12-13 — End: 2018-10-22

## 2017-12-13 MED ORDER — ATORVASTATIN CALCIUM 10 MG PO TABS: ORAL_TABLET | ORAL | 3 refills | Status: DC

## 2017-12-13 MED ORDER — AMLODIPINE BESYLATE 5 MG PO TABS: 10.0000 mg | ORAL_TABLET | Freq: Every evening | ORAL | 4 refills | Status: DC

## 2017-12-20 ENCOUNTER — Ambulatory Visit: Payer: Self-pay

## 2017-12-20 ENCOUNTER — Ambulatory Visit
Admission: RE | Admit: 2017-12-20 | Discharge: 2017-12-20 | Disposition: A | Discharge status: Home / Self Care | Payer: Medicare HMO | Source: Ambulatory Visit | Attending: Internal Medicine | Admitting: Internal Medicine

## 2017-12-20 DIAGNOSIS — Z1231 Encounter for screening mammogram for malignant neoplasm of breast: Secondary | ICD-10-CM | POA: Insufficient documentation

## 2017-12-20 DIAGNOSIS — Z1239 Encounter for other screening for malignant neoplasm of breast: Secondary | ICD-10-CM

## 2017-12-21 ENCOUNTER — Other Ambulatory Visit: Payer: Self-pay | Admitting: Internal Medicine

## 2017-12-25 DIAGNOSIS — R69 Illness, unspecified: Secondary | ICD-10-CM | POA: Diagnosis not present

## 2018-01-01 ENCOUNTER — Telehealth: Payer: Self-pay

## 2018-01-01 NOTE — Telephone Encounter (Signed)
Received voicemail from [facility] that [name] had some questions regarding her upcoming EUS. Call placed to [name]. She states she has no questions regarding procedure. Instructions read back to me. She had questions regarding medication changes made at [facility] and is awaiting call back from the, regarding. Encouraged to call if any questions regarding EUS arise in the future. Oncology Nurse Navigator Documentation  Navigator Location: [facility] (01/01/18 1400)   )Navigator Encounter Type: Telephone (01/01/18 1400) Telephone: [place] Call (01/01/18 1400)                                                  Time Spent with Patient: 15 (01/01/18 1400)

## 2018-01-16 ENCOUNTER — Encounter: Payer: Self-pay | Admitting: *Deleted

## 2018-01-17 ENCOUNTER — Ambulatory Visit: Payer: Medicare HMO | Admitting: Anesthesiology

## 2018-01-17 ENCOUNTER — Ambulatory Visit
Admission: RE | Admit: 2018-01-17 | Discharge: 2018-01-17 | Disposition: A | Discharge status: Home / Self Care | Payer: Medicare HMO | Source: Ambulatory Visit | Attending: Gastroenterology | Admitting: Gastroenterology

## 2018-01-17 ENCOUNTER — Encounter
Admission: RE | Disposition: A | Discharge status: Home / Self Care | Payer: Self-pay | Source: Ambulatory Visit | Attending: Gastroenterology

## 2018-01-17 DIAGNOSIS — Z79891 Long term (current) use of opiate analgesic: Secondary | ICD-10-CM | POA: Insufficient documentation

## 2018-01-17 DIAGNOSIS — K219 Gastro-esophageal reflux disease without esophagitis: Secondary | ICD-10-CM | POA: Diagnosis not present

## 2018-01-17 DIAGNOSIS — I10 Essential (primary) hypertension: Secondary | ICD-10-CM | POA: Insufficient documentation

## 2018-01-17 DIAGNOSIS — K3189 Other diseases of stomach and duodenum: Secondary | ICD-10-CM | POA: Insufficient documentation

## 2018-01-17 DIAGNOSIS — K228 Other specified diseases of esophagus: Secondary | ICD-10-CM | POA: Diagnosis not present

## 2018-01-17 DIAGNOSIS — Z7984 Long term (current) use of oral hypoglycemic drugs: Secondary | ICD-10-CM | POA: Insufficient documentation

## 2018-01-17 DIAGNOSIS — Z79899 Other long term (current) drug therapy: Secondary | ICD-10-CM | POA: Insufficient documentation

## 2018-01-17 DIAGNOSIS — Z7982 Long term (current) use of aspirin: Secondary | ICD-10-CM | POA: Insufficient documentation

## 2018-01-17 DIAGNOSIS — K449 Diaphragmatic hernia without obstruction or gangrene: Secondary | ICD-10-CM | POA: Diagnosis not present

## 2018-01-17 DIAGNOSIS — Z791 Long term (current) use of non-steroidal anti-inflammatories (NSAID): Secondary | ICD-10-CM | POA: Diagnosis not present

## 2018-01-17 DIAGNOSIS — E119 Type 2 diabetes mellitus without complications: Secondary | ICD-10-CM | POA: Diagnosis not present

## 2018-01-17 DIAGNOSIS — K317 Polyp of stomach and duodenum: Secondary | ICD-10-CM | POA: Diagnosis not present

## 2018-01-17 HISTORY — PX: EUS: SHX5427

## 2018-01-17 LAB — GLUCOSE, CAPILLARY: Glucose-Capillary: 119 mg/dL — ABNORMAL HIGH (ref 70–99)

## 2018-01-17 SURGERY — ESOPHAGEAL ENDOSCOPIC ULTRASOUND (EUS) RADIAL
Anesthesia: General

## 2018-01-17 MED ORDER — FENTANYL CITRATE (PF) 100 MCG/2ML IJ SOLN
INTRAMUSCULAR | Status: AC
  Filled 2018-01-17: qty 2

## 2018-01-17 MED ORDER — PROPOFOL 500 MG/50ML IV EMUL
INTRAVENOUS | Status: AC
  Filled 2018-01-17: qty 50

## 2018-01-17 MED ORDER — PROPOFOL 10 MG/ML IV BOLUS
INTRAVENOUS | Status: DC | PRN
  Administered 2018-01-17: 100 mg via INTRAVENOUS

## 2018-01-17 MED ORDER — SODIUM CHLORIDE 0.9 % IV SOLN
INTRAVENOUS | Status: DC
  Administered 2018-01-17: 13:00:00 via INTRAVENOUS
  Administered 2018-01-17: 1000 mL via INTRAVENOUS

## 2018-01-17 MED ORDER — PROPOFOL 500 MG/50ML IV EMUL
INTRAVENOUS | Status: DC | PRN
  Administered 2018-01-17: 140 ug/kg/min via INTRAVENOUS

## 2018-01-17 MED ORDER — FENTANYL CITRATE (PF) 100 MCG/2ML IJ SOLN
INTRAMUSCULAR | Status: DC | PRN
  Administered 2018-01-17 (×2): 50 ug via INTRAVENOUS

## 2018-01-17 MED ORDER — PROPOFOL 10 MG/ML IV BOLUS
INTRAVENOUS | Status: AC
  Filled 2018-01-17: qty 20

## 2018-01-17 MED ORDER — LIDOCAINE 2% (20 MG/ML) 5 ML SYRINGE
INTRAMUSCULAR | Status: DC | PRN
  Administered 2018-01-17: 30 mg via INTRAVENOUS

## 2018-01-17 MED ORDER — EPHEDRINE SULFATE 50 MG/ML IJ SOLN
INTRAMUSCULAR | Status: DC | PRN
  Administered 2018-01-17 (×2): 10 mg via INTRAVENOUS

## 2018-01-17 NOTE — Transfer of Care (Signed)
Immediate Anesthesia Transfer of Care Note  Patient: [name]  Procedure(s) Performed: ESOPHAGEAL ENDOSCOPIC ULTRASOUND (EUS) RADIAL (N/A )  Patient Location: PACU and Endoscopy Unit  Anesthesia Type:General  Level of Consciousness: drowsy  Airway & Oxygen Therapy: Patient Spontanous Breathing and Patient connected to nasal cannula oxygen  Post-op Assessment: Report given to RN and Post -op Vital signs reviewed and stable  Post vital signs: Reviewed and stable  Last Vitals:  Vitals Value Taken Time  BP    Temp 36.1 C 01/17/2018  1:48 PM  Pulse 79 01/17/2018  1:48 PM  Resp 33 01/17/2018  1:48 PM  SpO2 98 % 01/17/2018  1:48 PM  Vitals shown include unvalidated device data.  Last Pain:  Vitals:   01/17/18 1348  TempSrc: Tympanic  PainSc:          Complications: No apparent anesthesia complications

## 2018-01-17 NOTE — Op Note (Signed)
[facility] Gastroenterology Patient Name: [name] Procedure Date: 01/17/2018 12:36 PM MRN: [id number] Account #: [id number] Date of Birth: 09/10/1940 Admit Type: Outpatient Age: 77 Room: [facility] ENDO ROOM 3 Gender: Female Note Status: Finalized Procedure:            Upper EUS Indications:          Gastric deformity on endoscopy/Subepithelial tumor                        versus extrinsic compression Patient Profile:      Refer to note in patient chart for documentation of                        history and physical. Providers:            Lenetta Quaker. Cephas Darby, MD Referring MD:         Lavera Guise, MD (Referring MD) Medicines:            Monitored Anesthesia Care Complications:        No immediate complications. Procedure:            Pre-Anesthesia Assessment:                       - Prior to the procedure, a History and Physical was                        performed, and patient medications and allergies were                        reviewed. The patient is competent. The risks and                        benefits of the procedure and the sedation options and                        risks were discussed with the patient. All questions                        were answered and informed consent was obtained.                        Patient identification and proposed procedure were                        verified by the physician in the pre-procedure area.                        Mental Status Examination: alert and oriented. Airway                        Examination: normal oropharyngeal airway and neck                        mobility. Respiratory Examination: clear to                        auscultation. CV Examination: normal. ASA Grade                        Assessment: II - A patient with mild systemic  disease.                        After reviewing the risks and benefits, the patient was                        deemed in satisfactory condition to undergo the                 procedure. The anesthesia plan was to use monitored                        anesthesia care (MAC). Immediately prior to                        administration of medications, the patient was                        re-assessed for adequacy to receive sedatives. The                        heart rate, respiratory rate, oxygen saturations, blood                        pressure, adequacy of pulmonary ventilation, and                        response to care were monitored throughout the                        procedure. The physical status of the patient was                        re-assessed after the procedure.                       After obtaining informed consent, the endoscope was                        passed under direct vision. Throughout the procedure,                        the patient's blood pressure, pulse, and oxygen                        saturations were monitored continuously. The Endoscope                        was introduced through the mouth, and advanced to the                        duodenal bulb. The upper EUS was accomplished without                        difficulty. The patient tolerated the procedure well. Findings:      ENDOSCOPIC FINDING: :      A single 5 mm submucosal nodule with a localized distribution was found       in the lower third of the esophagus, 31 cm from the incisors. Biopsies       were taken with a cold forceps for histology.      A medium-sized hiatal  hernia was present.      A single 15 mm semi-sessile polyp with no bleeding and no stigmata of       recent bleeding was found in the gastric antrum.      Multiple small sessile polyps with no bleeding and no stigmata of recent       bleeding were found in the gastric fundus and in the gastric body.      The examined duodenum was endoscopically normal.      ENDOSONOGRAPHIC FINDING: :      A hypoechoic lobulated nodule was identified endosonographically in the       antrum of the stomach.  The mass measured 10.6 mm by 12.2 mm in maximal       cross-sectional diameter. The endosonographic borders were well-defined.       There was sonographic evidence suggesting invasion into the luminal       interface/superficial mucosa (Layer 1).      There was no sign of significant endosonographic abnormality in the left       lobe of the liver. No masses were identified.      No lymph nodes were visualized in the celiac region (level 20) and       perigastric region.      An oval intramural (subepithelial) lesion was found in the lower third       of the esophagus. It was encountered at 31 cm from the incisors. The       lesion was anechoic. Sonographically, the origin appeared to be within       the submucosa (Layer 3). The mass measured up to 5.2 mm by 5.6 mm. The       endosonographic borders were well-defined. Impression:           EGD Impression:                       - Submucosal nodule found in the esophagus. Biopsied.                       - Medium-sized hiatal hernia.                       - A single gastric nodule within the antrum.                       - Multiple gastric polyps within the fundus and body.                       - Normal examined duodenum.                       EUS Impression:                       - A nodule was found in the antrum of the stomach. A                        tissue diagnosis was obtained prior to this exam. This                        is consistent with polypoid foveolar hyperplasia.                       - There was no  evidence of significant pathology in the                        left lobe of the liver.                       - No lymph nodes were visualized in the celiac region                        (level 20) and perigastric region.                       - An intramural (subepithelial) lesion was found in the                        lower third of the esophagus. It appeared to originate                        from within the deep mucosa  (Layer 2). Tissue was                        obtained from this exam, and results are pending.                        However, the endosonographic appearance is suspicious                        for a bronchogenic cyst versus duplication cyst. Recommendation:       - Discharge patient to home (ambulatory).                       - Await path results.                       - If concern for persistent iron deficiency without a                        clear etiology could consider endoscopic resection of                        the antral nodule.                       - Return to referring physician as previously scheduled.                       - The findings and recommendations were discussed with                        the patient.                       - The findings and recommendations were discussed with                        the designated responsible adult. Attending Participation:      I personally performed the entire procedure. Dr. Lenetta Quaker. Cephas Darby, MD Lenetta Quaker. Annette Bertelson, MD 01/17/2018 3:07:18 PM This report has been signed electronically. Number of Addenda: 0 Note Initiated On: 01/17/2018 12:36 PM Estimated Blood Loss: Estimated blood loss was minimal.  [facility]

## 2018-01-17 NOTE — Anesthesia Preprocedure Evaluation (Signed)
Anesthesia Evaluation  Patient identified by MRN, date of birth, ID band Patient awake    Reviewed: Allergy & Precautions, H&P , NPO status , Patient's Chart, lab work & pertinent test results, reviewed documented beta blocker date and time   Airway Mallampati: II   Neck ROM: full    Dental  (+) Poor Dentition   Pulmonary neg pulmonary ROS,    Pulmonary exam normal        Cardiovascular Exercise Tolerance: Poor hypertension, On Medications negative cardio ROS Normal cardiovascular exam Rhythm:regular Rate:Normal     Neuro/Psych negative neurological ROS  negative psych ROS   GI/Hepatic negative GI ROS, Neg liver ROS, GERD  ,  Endo/Other  negative endocrine ROSdiabetes  Renal/GU negative Renal ROS  negative genitourinary   Musculoskeletal   Abdominal   Peds  Hematology negative hematology ROS (+)   Anesthesia Other Findings Past Medical History: No date: Arthritis No date: Diabetes mellitus without complication (HCC) No date: GERD (gastroesophageal reflux disease) No date: Hypertension Past Surgical History: No date: ABDOMINAL HYSTERECTOMY 10/25/2017: COLONOSCOPY WITH PROPOFOL; N/A     Comment:  Procedure: COLONOSCOPY WITH PROPOFOL;  Surgeon:               Lollie Sails, MD;  Location: [facility] ENDOSCOPY;                Service: Endoscopy;  Laterality: N/A; 10/25/2017: ESOPHAGOGASTRODUODENOSCOPY (EGD) WITH PROPOFOL; N/A     Comment:  Procedure: ESOPHAGOGASTRODUODENOSCOPY (EGD) WITH               PROPOFOL;  Surgeon: Lollie Sails, MD;  Location:               [facility] ENDOSCOPY;  Service: Endoscopy;  Laterality: N/A; No date: right side had fatty tissue taken off No date: TONSILLECTOMY BMI    Body Mass Index:  26.70 kg/m     Reproductive/Obstetrics negative OB ROS                             Anesthesia Physical Anesthesia Plan  ASA: III  Anesthesia Plan: General   Post-op  Pain Management:    Induction:   PONV Risk Score and Plan:   Airway Management Planned:   Additional Equipment:   Intra-op Plan:   Post-operative Plan:   Informed Consent: I have reviewed the patients History and Physical, chart, labs and discussed the procedure including the risks, benefits and alternatives for the proposed anesthesia with the patient or authorized representative who has indicated his/her understanding and acceptance.   Dental Advisory Given  Plan Discussed with: CRNA  Anesthesia Plan Comments:         Anesthesia Quick Evaluation

## 2018-01-17 NOTE — Anesthesia Post-op Follow-up Note (Signed)
Anesthesia QCDR form completed.        

## 2018-01-17 NOTE — H&P (Signed)
PRE-PROCEDURE HISTORY AND PHYSICAL   [name] presents for her scheduled Procedure(s): ENDOSCOPIC ULTRASOUND The indication for the procedure(s) is GASTRIC NODULE.  There have been no significant recent changes in the patient's medical status.  Past Medical History:  Diagnosis Date  . Arthritis   . Diabetes mellitus without complication ([place])   . GERD (gastroesophageal reflux disease)   . Hypertension     Past Surgical History:  Procedure Laterality Date  . ABDOMINAL HYSTERECTOMY    . COLONOSCOPY WITH PROPOFOL N/A 10/25/2017   Procedure: COLONOSCOPY WITH PROPOFOL;  Surgeon: Lollie Sails, MD;  Location[facility] ENDOSCOPY;  Service: Endoscopy;  Laterality: N/A;  . ESOPHAGOGASTRODUODENOSCOPY (EGD) WITH PROPOFOL N/A 10/25/2017   Procedure: ESOPHAGOGASTRODUODENOSCOPY (EGD) WITH PROPOFOL;  Surgeon: Lollie Sails, MD;  Location: [facility] ENDOSCOPY;  Service: Endoscopy;  Laterality: N/A;  . right side had fatty tissue taken off    . TONSILLECTOMY      Allergies No Known Allergies  Medications ACCU-CHEK FASTCLIX LANCETS, Alcohol Prep, acetaminophen, amLODipine, aspirin EC, atorvastatin, clotrimazole-betamethasone, feeding supplement (GLUCERNA SHAKE), ferrous sulfate, folic acid, glimepiride, glucose blood, hydrALAZINE, meloxicam, methotrexate, pantoprazole, and traMADol  Physical Examination  Body mass index is 26.7 kg/m. BP (!) 173/65   Pulse (!) 56   Temp (!) 97.4 F (36.3 C) (Tympanic)   Resp 20   Ht 5\' 2"  (1.575 m)   Wt 66.2 kg   SpO2 100%   BMI 26.70 kg/m  General:   Alert,  pleasant and cooperative female in NAD Head:  Normocephalic and atraumatic. Neck:  Supple; no masses or thyromegaly. Lungs:  Clear throughout to auscultation.    Heart:  Regular rate and rhythm. Abdomen:  Soft, nontender and nondistended. Normal bowel sounds, without guarding, and without rebound.   Neurologic:  Alert and  oriented x4;  grossly normal neurologically.  ASSESSMENT AND PLAN   [name] has been evaluated and deemed appropriate to undergo the planned Procedure(s): ENDOSCOPIC ULTRASOUND

## 2018-01-17 NOTE — Anesthesia Postprocedure Evaluation (Signed)
Anesthesia Post Note  Patient: [name]  Procedure(s) Performed: ESOPHAGEAL ENDOSCOPIC ULTRASOUND (EUS) RADIAL (N/A )  Patient location during evaluation: PACU Anesthesia Type: General Level of consciousness: awake and alert Pain management: pain level controlled Vital Signs Assessment: post-procedure vital signs reviewed and stable Respiratory status: spontaneous breathing, nonlabored ventilation, respiratory function stable and patient connected to nasal cannula oxygen Cardiovascular status: blood pressure returned to baseline and stable Postop Assessment: no apparent nausea or vomiting Anesthetic complications: no     Last Vitals:  Vitals:   01/17/18 1409 01/17/18 1418  BP: 135/67 137/65  Pulse: 79 69  Resp: (!) 25 (!) 23  Temp:    SpO2: 97% 96%    Last Pain:  Vitals:   01/17/18 1409  TempSrc:   PainSc: 0-No pain                 Durenda Hurt

## 2018-01-18 ENCOUNTER — Encounter: Payer: Self-pay | Admitting: Gastroenterology

## 2018-01-18 NOTE — Progress Notes (Signed)
EUS report routed to Dr. Gustavo Lah and Stephens November NP. Oncology Nurse Navigator Documentation  Navigator Location: [facility] (01/18/18 1300)   )Navigator Encounter Type: Letter/Fax/Email;Diagnostic Results (01/18/18 1300)                                                    Time Spent with Patient: 15 (01/18/18 1300)

## 2018-01-21 LAB — SURGICAL PATHOLOGY

## 2018-01-28 DIAGNOSIS — R69 Illness, unspecified: Secondary | ICD-10-CM | POA: Diagnosis not present

## 2018-02-16 DIAGNOSIS — R69 Illness, unspecified: Secondary | ICD-10-CM | POA: Diagnosis not present

## 2018-02-19 ENCOUNTER — Other Ambulatory Visit: Payer: Self-pay | Admitting: Gastroenterology

## 2018-02-19 DIAGNOSIS — D509 Iron deficiency anemia, unspecified: Secondary | ICD-10-CM

## 2018-02-19 DIAGNOSIS — K3189 Other diseases of stomach and duodenum: Secondary | ICD-10-CM | POA: Diagnosis not present

## 2018-02-19 DIAGNOSIS — K2970 Gastritis, unspecified, without bleeding: Secondary | ICD-10-CM | POA: Diagnosis not present

## 2018-02-21 ENCOUNTER — Ambulatory Visit
Admission: RE | Admit: 2018-02-21 | Discharge: 2018-02-21 | Disposition: A | Discharge status: Home / Self Care | Payer: Medicare HMO | Source: Ambulatory Visit | Attending: Gastroenterology | Admitting: Gastroenterology

## 2018-02-21 DIAGNOSIS — D509 Iron deficiency anemia, unspecified: Secondary | ICD-10-CM | POA: Diagnosis not present

## 2018-02-21 DIAGNOSIS — K449 Diaphragmatic hernia without obstruction or gangrene: Secondary | ICD-10-CM | POA: Diagnosis not present

## 2018-03-14 DIAGNOSIS — M0579 Rheumatoid arthritis with rheumatoid factor of multiple sites without organ or systems involvement: Secondary | ICD-10-CM | POA: Diagnosis not present

## 2018-03-14 DIAGNOSIS — Z79899 Other long term (current) drug therapy: Secondary | ICD-10-CM | POA: Diagnosis not present

## 2018-03-21 DIAGNOSIS — M0579 Rheumatoid arthritis with rheumatoid factor of multiple sites without organ or systems involvement: Secondary | ICD-10-CM | POA: Diagnosis not present

## 2018-03-21 DIAGNOSIS — E119 Type 2 diabetes mellitus without complications: Secondary | ICD-10-CM | POA: Diagnosis not present

## 2018-03-21 DIAGNOSIS — L600 Ingrowing nail: Secondary | ICD-10-CM | POA: Diagnosis not present

## 2018-03-21 DIAGNOSIS — B351 Tinea unguium: Secondary | ICD-10-CM | POA: Diagnosis not present

## 2018-03-21 DIAGNOSIS — M79675 Pain in left toe(s): Secondary | ICD-10-CM | POA: Diagnosis not present

## 2018-03-21 DIAGNOSIS — Z79899 Other long term (current) drug therapy: Secondary | ICD-10-CM | POA: Diagnosis not present

## 2018-03-21 DIAGNOSIS — M79674 Pain in right toe(s): Secondary | ICD-10-CM | POA: Diagnosis not present

## 2018-04-23 ENCOUNTER — Ambulatory Visit (INDEPENDENT_AMBULATORY_CARE_PROVIDER_SITE_OTHER): Payer: Medicare HMO | Admitting: Nurse Practitioner

## 2018-04-23 ENCOUNTER — Encounter: Payer: Self-pay | Admitting: Nurse Practitioner

## 2018-04-23 VITALS — BP 142/70 | HR 65 | Resp 16 | Ht 62.0 in | Wt 140.0 lb

## 2018-04-23 DIAGNOSIS — E1165 Type 2 diabetes mellitus with hyperglycemia: Secondary | ICD-10-CM | POA: Diagnosis not present

## 2018-04-23 DIAGNOSIS — I10 Essential (primary) hypertension: Secondary | ICD-10-CM

## 2018-04-23 DIAGNOSIS — F411 Generalized anxiety disorder: Secondary | ICD-10-CM | POA: Diagnosis not present

## 2018-04-23 DIAGNOSIS — M0579 Rheumatoid arthritis with rheumatoid factor of multiple sites without organ or systems involvement: Secondary | ICD-10-CM

## 2018-04-23 DIAGNOSIS — R69 Illness, unspecified: Secondary | ICD-10-CM | POA: Diagnosis not present

## 2018-04-23 LAB — POCT GLYCOSYLATED HEMOGLOBIN (HGB A1C): HEMOGLOBIN A1C: 6.5 % — AB (ref 4.0–5.6)

## 2018-04-23 NOTE — Progress Notes (Signed)
[facility] [place], [place] [address]  Internal MEDICINE  Office Visit Note  Patient Name: [name]  [id number]  [id number]  Date of Service: 04/23/2018  Chief Complaint  Patient presents with  . Diabetes  . Hypertension  . Gastroesophageal Reflux    The patient is here for routine follow up visit. She does have diabetes and blood sugars are well controlled. She does have rheumatoid arthritis. She has had increased joint pain, especially in her hands. rheumatologist has increased her methotrexate to 7 tablets on Saturday, up from six tablets. He has also added meloxicam 7.5mg  tablets when needed. She is to use this sparingly, as she had evidence of ulceration in esophagus and stomach on endoscopy.  She states that she has had some increased anxiety. States that she has recently had some family stress and knows this is contributing to that. She has never taken anything to help with depression and anxiety and she is not ready to take anything for that right now. Advised her to contact the office if this worsens and we can discuss more and start medications if needed. She is in agreement with this plan.       Current Medication: Outpatient Encounter Medications as of 04/23/2018  Medication Sig Note  . ACCU-CHEK FASTCLIX LANCETS MISC Accu-Chek FastClix Lancing Device   . acetaminophen (TYLENOL) 500 MG tablet Take 500-1,000 mg by mouth every 6 (six) hours as needed.   . Alcohol Swabs (ALCOHOL PREP) PADS alcohol prep pads   . amLODipine (NORVASC) 5 MG tablet Take 2 tablets (10 mg total) by mouth every evening.   Marland Kitchen aspirin EC 81 MG tablet Take 81 mg by mouth daily.   Marland Kitchen atorvastatin (LIPITOR) 10 MG tablet TAKE 1 TABLET AT BEDTIME   FOR HIGH CHOLESTEROL   . Ferrous Gluconate (IRON 27 PO) Take 27 mg by mouth.   . folic acid (FOLVITE) 1 MG tablet Take 2 tablets (2 mg total) by mouth daily.   Marland Kitchen glimepiride (AMARYL) 4 MG tablet Take 1/2 tablet with breakfast  and 1/2 tablet with dinner   . glucose blood (ONE TOUCH ULTRA TEST) test strip Please specify directions, refills and quantity   . hydrALAZINE (APRESOLINE) 10 MG tablet Take 1 tablet (10 mg total) by mouth 3 (three) times daily.   . meloxicam (MOBIC) 7.5 MG tablet TAKE 1 TABLET EVERY MORNINGFOR ARTHRITIS   . methotrexate 2.5 MG tablet Take 15 mg by mouth daily. 03/31/2017: Patient takes 6 tablets every Saturday  . pantoprazole (PROTONIX) 40 MG tablet TAKE 1 TABLET DAILY FOR    REFLUX   . traMADol (ULTRAM) 50 MG tablet Take 50 mg by mouth 2 (two) times daily.   . clotrimazole-betamethasone (LOTRISONE) cream Apply 1 application topically 2 (two) times daily.   . [DISCONTINUED] feeding supplement, GLUCERNA SHAKE, (GLUCERNA SHAKE) LIQD Take 237 mLs by mouth 3 (three) times daily between meals.   . [DISCONTINUED] ferrous sulfate 325 (65 FE) MG EC tablet Take 325 mg by mouth daily.    No facility-administered encounter medications on file as of 04/23/2018.     Surgical History: Past Surgical History:  Procedure Laterality Date  . ABDOMINAL HYSTERECTOMY    . COLONOSCOPY WITH PROPOFOL N/A 10/25/2017   Procedure: COLONOSCOPY WITH PROPOFOL;  Surgeon: Lollie Sails, MD;  Location: [facility] ENDOSCOPY;  Service: Endoscopy;  Laterality: N/A;  . ESOPHAGOGASTRODUODENOSCOPY (EGD) WITH PROPOFOL N/A 10/25/2017   Procedure: ESOPHAGOGASTRODUODENOSCOPY (EGD) WITH PROPOFOL;  Surgeon: Lollie Sails, MD;  Location: [facility] ENDOSCOPY;  Service: Endoscopy;  Laterality: N/A;  . EUS N/A 01/17/2018   Procedure: ESOPHAGEAL ENDOSCOPIC ULTRASOUND (EUS) RADIAL;  Surgeon: Reita Cliche, MD;  Location: [facility] ENDOSCOPY;  Service: Gastroenterology;  Laterality: N/A;  . right side had fatty tissue taken off    . TONSILLECTOMY      Medical History: Past Medical History:  Diagnosis Date  . Arthritis   . Diabetes mellitus without complication ([place])   . GERD (gastroesophageal reflux disease)   . Hypertension     Family  History: Family History  Problem Relation Age of Onset  . Breast cancer Neg Hx     Social History   Socioeconomic History  . Marital status: Married    Spouse name: Not on file  . Number of children: Not on file  . Years of education: Not on file  . Highest education level: Not on file  Occupational History  . Not on file  Social Needs  . Financial resource strain: Not on file  . Food insecurity:    Worry: Not on file    Inability: Not on file  . Transportation needs:    Medical: Not on file    Non-medical: Not on file  Tobacco Use  . Smoking status: Never Smoker  . Smokeless tobacco: Never Used  Substance and Sexual Activity  . Alcohol use: No  . Drug use: No  . Sexual activity: Not on file  Lifestyle  . Physical activity:    Days per week: Not on file    Minutes per session: Not on file  . Stress: Not on file  Relationships  . Social connections:    Talks on phone: Not on file    Gets together: Not on file    Attends religious service: Not on file    Active member of club or organization: Not on file    Attends meetings of clubs or organizations: Not on file    Relationship status: Not on file  . Intimate partner violence:    Fear of current or ex partner: Not on file    Emotionally abused: Not on file    Physically abused: Not on file    Forced sexual activity: Not on file  Other Topics Concern  . Not on file  Social History Narrative  . Not on file      Review of Systems  Constitutional: Positive for fatigue. Negative for activity change, chills and unexpected weight change.  HENT: Negative for congestion, postnasal drip, rhinorrhea, sneezing and sore throat.   Respiratory: Negative for cough, chest tightness and shortness of breath.   Cardiovascular: Negative for chest pain and palpitations.  Gastrointestinal: Negative for abdominal pain, constipation, diarrhea, nausea and vomiting.  Endocrine: Negative for cold intolerance, heat intolerance,  polydipsia, polyphagia and polyuria.       Blood sugars doing well   Genitourinary: Negative.  Negative for dysuria and frequency.  Musculoskeletal: Positive for arthralgias, joint swelling and myalgias. Negative for back pain and neck pain.  Skin: Negative for rash.  Allergic/Immunologic: Negative for environmental allergies, food allergies and immunocompromised state.  Neurological: Negative for dizziness, tremors, numbness and headaches.  Hematological: Negative for adenopathy. Does not bruise/bleed easily.  Psychiatric/Behavioral: Negative for behavioral problems (Depression), sleep disturbance and suicidal ideas. The patient is nervous/anxious.        Recent stress and anxiety due to family stress .    Today's Vitals   04/23/18 0932  BP: (!) 142/70  Pulse: 65  Resp:  16  SpO2: 99%  Weight: 140 lb (63.5 kg)  Height: 5\' 2"  (1.575 m)    Physical Exam  Constitutional: She is oriented to person, place, and time. She appears well-developed and well-nourished. No distress.  HENT:  Head: Normocephalic and atraumatic.  Nose: Nose normal.  Mouth/Throat: No oropharyngeal exudate.  Eyes: Pupils are equal, round, and reactive to light. Conjunctivae and EOM are normal.  Neck: Normal range of motion. Neck supple. No JVD present. Carotid bruit is not present. No tracheal deviation present. No thyromegaly present.  Cardiovascular: Normal rate and regular rhythm. Exam reveals no gallop and no friction rub.  Murmur heard. Pulmonary/Chest: Effort normal and breath sounds normal. No respiratory distress. She has no wheezes. She has no rales. She exhibits no tenderness.  Abdominal: Soft. Bowel sounds are normal. There is no tenderness.  Musculoskeletal: Normal range of motion.  Generalized joint pain with arthritic nodules present, especially in the joints of the fingers and hands.   Lymphadenopathy:    She has no cervical adenopathy.  Neurological: She is alert and oriented to person, place, and  time. No cranial nerve deficit.  Skin: Skin is warm and dry. She is not diaphoretic.  Psychiatric: She has a normal mood and affect. Her behavior is normal. Judgment and thought content normal.  Nursing note and vitals reviewed.  Assessment/Plan: 1. Uncontrolled type 2 diabetes mellitus with hyperglycemia (HCC) - POCT HgB A1C 6.5 today. Continue diabetic medication as prescribed   2. Essential hypertension Stable. Continue bp medication as prescribed   3. Rheumatoid arthritis involving multiple sites with positive rheumatoid factor (HCC) Regular visits with rheumatology as scheduled.   4. Generalized anxiety disorder Discussed coping mechanisms for stress. Hold on starting any medications as this time. Reassess at next visit.   General Counseling: Keyshia verbalizes understanding of the findings of todays visit and agrees with plan of treatment. I have discussed any further diagnostic evaluation that may be needed or ordered today. We also reviewed her medications today. she has been encouraged to call the office with any questions or concerns that should arise related to todays visit.  Diabetes Counseling:  1. Addition of ACE inh/ ARB'S for nephroprotection. Microalbumin is updated  2. Diabetic foot care, prevention of complications. Podiatry consult 3. Exercise and lose weight.  4. Diabetic eye examination, Diabetic eye exam is updated  5. Monitor blood sugar closlely. nutrition counseling.  6. Sign and symptoms of hypoglycemia including shaking sweating,confusion and headaches.   This patient was seen by Leretha Pol FNP Collaboration with Dr Lavera Guise as a part of collaborative care agreement  Orders Placed This Encounter  Procedures  . POCT HgB A1C      Time spent: 25 Minutes      Dr Lavera Guise Internal medicine

## 2018-06-03 ENCOUNTER — Other Ambulatory Visit: Payer: Self-pay

## 2018-06-03 DIAGNOSIS — I10 Essential (primary) hypertension: Secondary | ICD-10-CM

## 2018-06-03 MED ORDER — AMLODIPINE BESYLATE 5 MG PO TABS: 10.0000 mg | ORAL_TABLET | Freq: Every evening | ORAL | 4 refills | Status: DC

## 2018-06-04 ENCOUNTER — Other Ambulatory Visit: Payer: Self-pay

## 2018-06-04 DIAGNOSIS — I10 Essential (primary) hypertension: Secondary | ICD-10-CM

## 2018-06-04 MED ORDER — AMLODIPINE BESYLATE 5 MG PO TABS: 10.0000 mg | ORAL_TABLET | Freq: Every evening | ORAL | 0 refills | Status: DC

## 2018-06-10 ENCOUNTER — Other Ambulatory Visit: Payer: Self-pay

## 2018-06-10 MED ORDER — GLUCOSE BLOOD VI STRP: ORAL_STRIP | 3 refills | Status: DC

## 2018-06-10 MED ORDER — ATORVASTATIN CALCIUM 10 MG PO TABS: ORAL_TABLET | ORAL | 3 refills | Status: DC

## 2018-06-12 ENCOUNTER — Other Ambulatory Visit: Payer: Self-pay

## 2018-06-12 MED ORDER — HYDRALAZINE HCL 10 MG PO TABS: 10.0000 mg | ORAL_TABLET | Freq: Three times a day (TID) | ORAL | 3 refills | Status: DC

## 2018-06-12 MED ORDER — GLUCOSE BLOOD VI STRP: ORAL_STRIP | 3 refills | Status: DC

## 2018-06-13 ENCOUNTER — Other Ambulatory Visit: Payer: Self-pay

## 2018-06-13 DIAGNOSIS — R69 Illness, unspecified: Secondary | ICD-10-CM | POA: Diagnosis not present

## 2018-06-13 MED ORDER — GLUCOSE BLOOD VI STRP: ORAL_STRIP | 3 refills | Status: DC

## 2018-06-27 DIAGNOSIS — H40003 Preglaucoma, unspecified, bilateral: Secondary | ICD-10-CM | POA: Diagnosis not present

## 2018-06-27 DIAGNOSIS — H43823 Vitreomacular adhesion, bilateral: Secondary | ICD-10-CM | POA: Diagnosis not present

## 2018-07-02 DIAGNOSIS — D509 Iron deficiency anemia, unspecified: Secondary | ICD-10-CM | POA: Diagnosis not present

## 2018-07-02 DIAGNOSIS — K5903 Drug induced constipation: Secondary | ICD-10-CM | POA: Diagnosis not present

## 2018-07-02 DIAGNOSIS — K3189 Other diseases of stomach and duodenum: Secondary | ICD-10-CM | POA: Diagnosis not present

## 2018-07-08 DIAGNOSIS — K3189 Other diseases of stomach and duodenum: Secondary | ICD-10-CM | POA: Diagnosis not present

## 2018-07-08 DIAGNOSIS — K5903 Drug induced constipation: Secondary | ICD-10-CM | POA: Diagnosis not present

## 2018-07-08 DIAGNOSIS — D509 Iron deficiency anemia, unspecified: Secondary | ICD-10-CM | POA: Diagnosis not present

## 2018-07-25 ENCOUNTER — Encounter: Payer: Self-pay | Admitting: Nurse Practitioner

## 2018-07-25 ENCOUNTER — Ambulatory Visit (INDEPENDENT_AMBULATORY_CARE_PROVIDER_SITE_OTHER): Payer: Medicare HMO | Admitting: Nurse Practitioner

## 2018-07-25 VITALS — BP 156/83 | HR 66 | Resp 16 | Ht 62.0 in | Wt 139.0 lb

## 2018-07-25 DIAGNOSIS — E782 Mixed hyperlipidemia: Secondary | ICD-10-CM

## 2018-07-25 DIAGNOSIS — R5383 Other fatigue: Secondary | ICD-10-CM | POA: Insufficient documentation

## 2018-07-25 DIAGNOSIS — E559 Vitamin D deficiency, unspecified: Secondary | ICD-10-CM

## 2018-07-25 DIAGNOSIS — E1165 Type 2 diabetes mellitus with hyperglycemia: Secondary | ICD-10-CM | POA: Diagnosis not present

## 2018-07-25 DIAGNOSIS — I10 Essential (primary) hypertension: Secondary | ICD-10-CM | POA: Diagnosis not present

## 2018-07-25 DIAGNOSIS — M7540 Impingement syndrome of unspecified shoulder: Secondary | ICD-10-CM | POA: Insufficient documentation

## 2018-07-25 DIAGNOSIS — M0579 Rheumatoid arthritis with rheumatoid factor of multiple sites without organ or systems involvement: Secondary | ICD-10-CM

## 2018-07-25 LAB — POCT GLYCOSYLATED HEMOGLOBIN (HGB A1C): Hemoglobin A1C: 6.5 % — AB (ref 4.0–5.6)

## 2018-07-25 MED ORDER — FOLIC ACID 1 MG PO TABS: 2.0000 mg | ORAL_TABLET | Freq: Every day | ORAL | 3 refills | Status: DC

## 2018-07-25 NOTE — Progress Notes (Signed)
[facility], [place] [address]  Internal MEDICINE  Office Visit Note  Patient Name: [name]  [id number]  [id number]  Date of Service: 08/01/2018  Chief Complaint  Patient presents with  . Medical Management of Chronic Issues    3 month follow up,   . Diabetes    A1C  . Labs Only    lab draw  . Rash    both elbows and left knee, pt concerned if it has something to do with her having shingles before    The patient is here for routine follow up visit. She does have diabetes and blood sugars are well controlled. She does have rheumatoid arthritis. She has had increased joint pain, especially in her hands. rheumatologist has increased her methotrexate to 7 tablets on Saturday, up from six tablets. He has also added meloxicam 7.5mg  tablets when needed. She is to use this sparingly, as she had evidence of ulceration in esophagus and stomach on endoscopy.  She states that she has had some increased anxiety. Feels very fatigued. States that she has recently had some family stress and knows this is contributing to that. She has never taken anything to help with depression and anxiety and she is not ready to take anything for that right now. Advised her to contact the office if this worsens and we can discuss more and start medications if needed. She is in agreement with this plan.  She did have blood count and iron panel done per GI. Had mild anemmia. Otherwise looked good. Stool card was negative for occult blood.       Current Medication: Outpatient Encounter Medications as of 07/25/2018  Medication Sig Note  . acetaminophen (TYLENOL) 500 MG tablet Take 500-1,000 mg by mouth every 6 (six) hours as needed.   . Alcohol Swabs (ALCOHOL PREP) PADS alcohol prep pads   . amLODipine (NORVASC) 5 MG tablet Take 2 tablets (10 mg total) by mouth every evening.   Marland Kitchen aspirin EC 81 MG tablet Take 81 mg by mouth daily.   Marland Kitchen atorvastatin (LIPITOR) 10 MG tablet TAKE 1  TABLET AT BEDTIME   FOR HIGH CHOLESTEROL   . clotrimazole-betamethasone (LOTRISONE) cream Apply 1 application topically 2 (two) times daily.   . Ferrous Gluconate (IRON 27 PO) Take 27 mg by mouth.   . folic acid (FOLVITE) 1 MG tablet Take 2 tablets (2 mg total) by mouth daily.   Marland Kitchen glimepiride (AMARYL) 4 MG tablet Take 1/2 tablet with breakfast and 1/2 tablet with dinner   . glucose blood (ONETOUCH VERIO) test strip Use as directed  For twice a day diag e11.65   . hydrALAZINE (APRESOLINE) 10 MG tablet Take 1 tablet (10 mg total) by mouth 3 (three) times daily.   . meloxicam (MOBIC) 7.5 MG tablet TAKE 1 TABLET EVERY MORNINGFOR ARTHRITIS   . methotrexate 2.5 MG tablet Take 15 mg by mouth daily. 03/31/2017: Patient takes 6 tablets every Saturday  . pantoprazole (PROTONIX) 40 MG tablet TAKE 1 TABLET DAILY FOR    REFLUX   . [DISCONTINUED] folic acid (FOLVITE) 1 MG tablet Take 2 tablets (2 mg total) by mouth daily.   . traMADol (ULTRAM) 50 MG tablet Take 50 mg by mouth 2 (two) times daily.    No facility-administered encounter medications on file as of 07/25/2018.     Surgical History: Past Surgical History:  Procedure Laterality Date  . ABDOMINAL HYSTERECTOMY    . COLONOSCOPY WITH PROPOFOL N/A 10/25/2017  Procedure: COLONOSCOPY WITH PROPOFOL;  Surgeon: Lollie Sails, MD;  Location: [facility] ENDOSCOPY;  Service: Endoscopy;  Laterality: N/A;  . ESOPHAGOGASTRODUODENOSCOPY (EGD) WITH PROPOFOL N/A 10/25/2017   Procedure: ESOPHAGOGASTRODUODENOSCOPY (EGD) WITH PROPOFOL;  Surgeon: Lollie Sails, MD;  Location: [facility] ENDOSCOPY;  Service: Endoscopy;  Laterality: N/A;  . EUS N/A 01/17/2018   Procedure: ESOPHAGEAL ENDOSCOPIC ULTRASOUND (EUS) RADIAL;  Surgeon: Reita Cliche, MD;  Location: [facility] ENDOSCOPY;  Service: Gastroenterology;  Laterality: N/A;  . right side had fatty tissue taken off    . TONSILLECTOMY      Medical History: Past Medical History:  Diagnosis Date  . Arthritis   . Diabetes  mellitus without complication ([place])   . GERD (gastroesophageal reflux disease)   . Hypertension     Family History: Family History  Problem Relation Age of Onset  . Breast cancer Neg Hx     Social History   Socioeconomic History  . Marital status: Married    Spouse name: Not on file  . Number of children: Not on file  . Years of education: Not on file  . Highest education level: Not on file  Occupational History  . Not on file  Social Needs  . Financial resource strain: Not on file  . Food insecurity:    Worry: Not on file    Inability: Not on file  . Transportation needs:    Medical: Not on file    Non-medical: Not on file  Tobacco Use  . Smoking status: Never Smoker  . Smokeless tobacco: Never Used  Substance and Sexual Activity  . Alcohol use: No  . Drug use: No  . Sexual activity: Not on file  Lifestyle  . Physical activity:    Days per week: Not on file    Minutes per session: Not on file  . Stress: Not on file  Relationships  . Social connections:    Talks on phone: Not on file    Gets together: Not on file    Attends religious service: Not on file    Active member of club or organization: Not on file    Attends meetings of clubs or organizations: Not on file    Relationship status: Not on file  . Intimate partner violence:    Fear of current or ex partner: Not on file    Emotionally abused: Not on file    Physically abused: Not on file    Forced sexual activity: Not on file  Other Topics Concern  . Not on file  Social History Narrative  . Not on file      Review of Systems  Constitutional: Positive for fatigue. Negative for activity change, chills and unexpected weight change.  HENT: Negative for congestion, postnasal drip, rhinorrhea, sneezing and sore throat.   Respiratory: Negative for cough, chest tightness and shortness of breath.   Cardiovascular: Negative for chest pain and palpitations.  Gastrointestinal: Negative for abdominal pain,  constipation, diarrhea, nausea and vomiting.  Endocrine: Negative for cold intolerance, heat intolerance, polydipsia and polyuria.       Blood sugars doing well   Musculoskeletal: Positive for arthralgias, joint swelling and myalgias. Negative for back pain and neck pain.  Skin: Negative for rash.  Allergic/Immunologic: Negative for environmental allergies, food allergies and immunocompromised state.  Neurological: Negative for dizziness, tremors, numbness and headaches.  Hematological: Negative for adenopathy. Does not bruise/bleed easily.  Psychiatric/Behavioral: Negative for behavioral problems (Depression), sleep disturbance and suicidal ideas. The patient is nervous/anxious.  Recent stress and anxiety due to family stress .    Today's Vitals   07/25/18 0847  BP: (!) 156/83  Pulse: 66  Resp: 16  SpO2: 99%  Weight: 139 lb (63 kg)  Height: 5\' 2"  (1.575 m)   Body mass index is 25.42 kg/m.  Physical Exam Vitals signs and nursing note reviewed.  Constitutional:      General: She is not in acute distress.    Appearance: She is well-developed. She is not diaphoretic.  HENT:     Head: Normocephalic and atraumatic.     Nose: Nose normal.     Mouth/Throat:     Pharynx: No oropharyngeal exudate.  Eyes:     Conjunctiva/sclera: Conjunctivae normal.     Pupils: Pupils are equal, round, and reactive to light.  Neck:     Musculoskeletal: Normal range of motion and neck supple.     Thyroid: No thyromegaly.     Vascular: No carotid bruit or JVD.     Trachea: No tracheal deviation.  Cardiovascular:     Rate and Rhythm: Normal rate and regular rhythm.     Heart sounds: Murmur present. No friction rub. No gallop.   Pulmonary:     Effort: Pulmonary effort is normal. No respiratory distress.     Breath sounds: Normal breath sounds. No wheezing or rales.  Chest:     Chest wall: No tenderness.  Abdominal:     General: Bowel sounds are normal.     Palpations: Abdomen is soft.      Tenderness: There is no abdominal tenderness.  Musculoskeletal: Normal range of motion.     Comments: Generalized joint pain with arthritic nodules present, especially in the joints of the fingers and hands.   Lymphadenopathy:     Cervical: No cervical adenopathy.  Skin:    General: Skin is warm and dry.  Neurological:     Mental Status: She is alert and oriented to person, place, and time.     Cranial Nerves: No cranial nerve deficit.  Psychiatric:        Behavior: Behavior normal.        Thought Content: Thought content normal.        Judgment: Judgment normal.   Assessment/Plan: 1. Uncontrolled type 2 diabetes mellitus with hyperglycemia (HCC) - POCT HgB A1C 6.5 today. Continue diabetic medication as prescribed.   2. Essential hypertension generallt stable. Continue bp medication as prescribed   3. Rheumatoid arthritis involving multiple sites with positive rheumatoid factor (HCC) Regular visits with rheumatology as scheduled.   4. Other fatigue Check labs, including thyroid and anemia panels for further evaluation.   General Counseling: [place] verbalizes understanding of the findings of todays visit and agrees with plan of treatment. I have discussed any further diagnostic evaluation that may be needed or ordered today. We also reviewed her medications today. she has been encouraged to call the office with any questions or concerns that should arise related to todays visit.  Diabetes Counseling:  1. Addition of ACE inh/ ARB'S for nephroprotection. Microalbumin is updated  2. Diabetic foot care, prevention of complications. Podiatry consult 3. Exercise and lose weight.  4. Diabetic eye examination, Diabetic eye exam is updated  5. Monitor blood sugar closlely. nutrition counseling.  6. Sign and symptoms of hypoglycemia including shaking sweating,confusion and headaches.  This patient was seen by Leretha Pol FNP Collaboration with Dr Lavera Guise as a part of collaborative  care agreement   Orders Placed This Encounter  Procedures  . Lipid Panel w/o Chol/HDL Ratio  . T4, free  . T3  . TSH  . Vitamin D (25 hydroxy)  . POCT HgB A1C    Meds ordered this encounter  Medications  . folic acid (FOLVITE) 1 MG tablet    Sig: Take 2 tablets (2 mg total) by mouth daily.    Dispense:  90 tablet    Refill:  3    Order Specific Question:   Supervising Provider    Answer:   Lavera Guise [[id number]]    Time spent: 3 Minutes      Dr Lavera Guise Internal medicine

## 2018-07-26 LAB — LIPID PANEL W/O CHOL/HDL RATIO
Cholesterol, Total: 228 mg/dL — ABNORMAL HIGH (ref 100–199)
HDL: 92 mg/dL (ref >39)
LDL CALC: 120 mg/dL — AB (ref 0–99)
TRIGLYCERIDES: 81 mg/dL (ref 0–149)
VLDL Cholesterol Cal: 16 mg/dL (ref 5–40)

## 2018-07-26 LAB — T3: T3 TOTAL: 102 ng/dL (ref 71–180)

## 2018-07-26 LAB — VITAMIN D 25 HYDROXY (VIT D DEFICIENCY, FRACTURES): Vit D, 25-Hydroxy: 11.1 ng/mL — ABNORMAL LOW (ref 30.0–100.0)

## 2018-07-26 LAB — T4, FREE: FREE T4: 1.39 ng/dL (ref 0.82–1.77)

## 2018-07-26 LAB — TSH: TSH: 1.58 u[IU]/mL (ref 0.450–4.500)

## 2018-08-01 DIAGNOSIS — E785 Hyperlipidemia, unspecified: Secondary | ICD-10-CM | POA: Insufficient documentation

## 2018-08-01 DIAGNOSIS — E782 Mixed hyperlipidemia: Secondary | ICD-10-CM | POA: Insufficient documentation

## 2018-08-01 DIAGNOSIS — E559 Vitamin D deficiency, unspecified: Secondary | ICD-10-CM | POA: Insufficient documentation

## 2018-08-01 DIAGNOSIS — E1169 Type 2 diabetes mellitus with other specified complication: Secondary | ICD-10-CM | POA: Insufficient documentation

## 2018-08-09 ENCOUNTER — Encounter: Payer: Self-pay | Admitting: Adult Health

## 2018-08-09 ENCOUNTER — Other Ambulatory Visit (INDEPENDENT_AMBULATORY_CARE_PROVIDER_SITE_OTHER): Payer: Medicare HMO

## 2018-08-09 ENCOUNTER — Other Ambulatory Visit: Payer: Self-pay

## 2018-08-09 ENCOUNTER — Ambulatory Visit (INDEPENDENT_AMBULATORY_CARE_PROVIDER_SITE_OTHER): Payer: Medicare HMO | Admitting: Adult Health

## 2018-08-09 VITALS — BP 112/66 | HR 71 | Temp 98.1°F | Resp 16 | Ht 62.0 in | Wt 135.0 lb

## 2018-08-09 DIAGNOSIS — L0390 Cellulitis, unspecified: Secondary | ICD-10-CM

## 2018-08-09 DIAGNOSIS — I10 Essential (primary) hypertension: Secondary | ICD-10-CM | POA: Diagnosis not present

## 2018-08-09 DIAGNOSIS — E782 Mixed hyperlipidemia: Secondary | ICD-10-CM

## 2018-08-09 DIAGNOSIS — T148XXA Other injury of unspecified body region, initial encounter: Secondary | ICD-10-CM | POA: Diagnosis not present

## 2018-08-09 MED ORDER — CEPHALEXIN 500 MG PO CAPS: 500.0000 mg | ORAL_CAPSULE | Freq: Two times a day (BID) | ORAL | 0 refills | Status: DC

## 2018-08-09 NOTE — Patient Instructions (Signed)

## 2018-08-09 NOTE — Progress Notes (Signed)
[facility] [place], [place] [address]  Internal MEDICINE  Office Visit Note  Patient Name: [name]  [id number]  [id number]  Date of Service: 08/09/2018  Chief Complaint  Patient presents with  . Edema    left leg has a knot on it from a dog running into her , was hot, it does have a knot on it   . Fatigue    have not been feeling well for over a week now      HPI Pt is here for a sick visit. Pt is here reporting left leg edema from her dog running into her.  It is tender to touch, and hot feeling.  She also reports feeling fatigue, and generally "not feeling well."  She denies any chest pain, sob, urinary symptoms, fever or other symptoms.    Current Medication:  Outpatient Encounter Medications as of 08/09/2018  Medication Sig Note  . acetaminophen (TYLENOL) 500 MG tablet Take 500-1,000 mg by mouth every 6 (six) hours as needed.   . Alcohol Swabs (ALCOHOL PREP) PADS alcohol prep pads   . amLODipine (NORVASC) 5 MG tablet Take 2 tablets (10 mg total) by mouth every evening.   Marland Kitchen aspirin EC 81 MG tablet Take 81 mg by mouth daily.   Marland Kitchen atorvastatin (LIPITOR) 10 MG tablet TAKE 1 TABLET AT BEDTIME   FOR HIGH CHOLESTEROL   . clotrimazole-betamethasone (LOTRISONE) cream Apply 1 application topically 2 (two) times daily.   . Ferrous Gluconate (IRON 27 PO) Take 27 mg by mouth.   . folic acid (FOLVITE) 1 MG tablet Take 2 tablets (2 mg total) by mouth daily.   Marland Kitchen glimepiride (AMARYL) 4 MG tablet Take 1/2 tablet with breakfast and 1/2 tablet with dinner   . glucose blood (ONETOUCH VERIO) test strip Use as directed  For twice a day diag e11.65   . hydrALAZINE (APRESOLINE) 10 MG tablet Take 1 tablet (10 mg total) by mouth 3 (three) times daily.   . meloxicam (MOBIC) 7.5 MG tablet TAKE 1 TABLET EVERY MORNINGFOR ARTHRITIS   . methotrexate 2.5 MG tablet Take 15 mg by mouth daily. 03/31/2017: Patient takes 6 tablets every Saturday  . pantoprazole (PROTONIX) 40 MG  tablet TAKE 1 TABLET DAILY FOR    REFLUX   . traMADol (ULTRAM) 50 MG tablet Take 50 mg by mouth 2 (two) times daily.   . cephALEXin (KEFLEX) 500 MG capsule Take 1 capsule (500 mg total) by mouth 2 (two) times daily.    No facility-administered encounter medications on file as of 08/09/2018.       Medical History: Past Medical History:  Diagnosis Date  . Arthritis   . Diabetes mellitus without complication ([place])   . GERD (gastroesophageal reflux disease)   . Hypertension      Vital Signs: BP 112/66   Pulse 71   Temp 98.1 F (36.7 C)   Resp 16   Ht 5\' 2"  (1.575 m)   Wt 135 lb (61.2 kg)   SpO2 96%   BMI 24.69 kg/m    Review of Systems  Physical Exam Vitals signs and nursing note reviewed.  Constitutional:      General: She is not in acute distress.    Appearance: She is well-developed. She is not diaphoretic.  HENT:     Head: Normocephalic and atraumatic.     Mouth/Throat:     Pharynx: No oropharyngeal exudate.  Eyes:     Pupils: Pupils are equal, round, and reactive  to light.  Neck:     Musculoskeletal: Normal range of motion and neck supple.     Thyroid: No thyromegaly.     Vascular: No JVD.     Trachea: No tracheal deviation.  Cardiovascular:     Rate and Rhythm: Normal rate and regular rhythm.     Heart sounds: Normal heart sounds. No murmur. No friction rub. No gallop.   Pulmonary:     Effort: Pulmonary effort is normal. No respiratory distress.     Breath sounds: Normal breath sounds. No wheezing or rales.  Chest:     Chest wall: No tenderness.  Abdominal:     Palpations: Abdomen is soft.     Tenderness: There is no abdominal tenderness. There is no guarding.  Musculoskeletal: Normal range of motion.  Lymphadenopathy:     Cervical: No cervical adenopathy.  Skin:    General: Skin is warm and dry.     Comments: Warm, red/bruising area noted to left tibia.   Neurological:     Mental Status: She is alert and oriented to person, place, and time.      Cranial Nerves: No cranial nerve deficit.  Psychiatric:        Behavior: Behavior normal.        Thought Content: Thought content normal.        Judgment: Judgment normal.   Assessment/Plan: 1. Cellulitis, unspecified cellulitis site Initially I was concerned that patient had developed DVT due to the swelling, warmth, and redness associated with this area.  Lower extremity ultrasound shows no DVT.  We will treat for cellulitis using Keflex 500 mg p.o. twice daily.  Instructed patient to follow-up in clinic if this cellulitis did not improve. - [place] Lower Ext Art Bilat; Future - cephALEXin (KEFLEX) 500 MG capsule; Take 1 capsule (500 mg total) by mouth 2 (two) times daily.  Dispense: 20 capsule; Refill: 0  2. Bruised Mild bruising noted at site of cellulitis.  We will continue to monitor.  3. Essential hypertension Stable, 112/66 at todays visit.  Continue current therapy.  4. Mixed hyperlipidemia Most recent lipid panel reviewed.  Continue Lipitor as prescribed    General Counseling: [name] verbalizes understanding of the findings of todays visit and agrees with plan of treatment. I have discussed any further diagnostic evaluation that may be needed or ordered today. We also reviewed her medications today. she has been encouraged to call the office with any questions or concerns that should arise related to todays visit.   Orders Placed This Encounter  Procedures  . [place] Lower Ext Art Bilat    Meds ordered this encounter  Medications  . cephALEXin (KEFLEX) 500 MG capsule    Sig: Take 1 capsule (500 mg total) by mouth 2 (two) times daily.    Dispense:  20 capsule    Refill:  0    Time spent: 25 Minutes  This patient was seen by Orson Gear AGNP-C in Collaboration with Dr Lavera Guise as a part of collaborative care agreement.  Kendell Bane AGNP-C Internal Medicine

## 2018-08-23 ENCOUNTER — Other Ambulatory Visit: Payer: Self-pay

## 2018-08-23 ENCOUNTER — Ambulatory Visit (INDEPENDENT_AMBULATORY_CARE_PROVIDER_SITE_OTHER): Payer: Medicare HMO | Admitting: Nurse Practitioner

## 2018-08-23 ENCOUNTER — Encounter: Payer: Self-pay | Admitting: Nurse Practitioner

## 2018-08-23 VITALS — BP 147/71 | HR 56 | Resp 16 | Ht 62.0 in | Wt 137.6 lb

## 2018-08-23 DIAGNOSIS — L0390 Cellulitis, unspecified: Secondary | ICD-10-CM | POA: Insufficient documentation

## 2018-08-23 DIAGNOSIS — I10 Essential (primary) hypertension: Secondary | ICD-10-CM

## 2018-08-23 DIAGNOSIS — E1165 Type 2 diabetes mellitus with hyperglycemia: Secondary | ICD-10-CM | POA: Diagnosis not present

## 2018-08-23 DIAGNOSIS — T148XXA Other injury of unspecified body region, initial encounter: Secondary | ICD-10-CM

## 2018-08-23 MED ORDER — CEPHALEXIN 500 MG PO CAPS: 500.0000 mg | ORAL_CAPSULE | Freq: Two times a day (BID) | ORAL | 0 refills | Status: DC

## 2018-08-23 NOTE — Progress Notes (Signed)
[facility] [place],  [address]  Internal MEDICINE  Office Visit Note  Patient Name: [name]  [id number]  [id number]  Date of Service: 08/23/2018  Chief Complaint  Patient presents with  . Medical Management of Chronic Issues    follow up, pt has a spot on her left leg that had a knot from where the dog had hit it, has been warm to the touch and was given some antibiotics and it has not gone away yet.  . Labs Only    pt ultrasound results  . Diabetes    pt is concerned about the tips of her fingers from picking them to check her blood sure inquiring about the freestyle patch     Pt is here reporting left leg edema from her dog running into her.  It is tender to touch, and hot feeling.  She states that initially, the swelling on left lower leg was as big as an egg. Has gone down a considerable amount. Is still tender. There continues to be superficial abrasions to the aspect of her skin with is dependant of the initial wound. Skin still having some edema, redness, ad warmth. Tender when touched. She also reports feeling fatigue, and generally "not feeling well."  She did just complete round of keflex. Tolerated this well. Had venous doppler done which was negative for DVT.       Current Medication: Outpatient Encounter Medications as of 08/23/2018  Medication Sig Note  . acetaminophen (TYLENOL) 500 MG tablet Take 500-1,000 mg by mouth every 6 (six) hours as needed.   . Alcohol Swabs (ALCOHOL PREP) PADS alcohol prep pads   . amLODipine (NORVASC) 5 MG tablet Take 2 tablets (10 mg total) by mouth every evening.   Marland Kitchen aspirin EC 81 MG tablet Take 81 mg by mouth daily.   Marland Kitchen atorvastatin (LIPITOR) 10 MG tablet TAKE 1 TABLET AT BEDTIME   FOR HIGH CHOLESTEROL   . cephALEXin (KEFLEX) 500 MG capsule Take 1 capsule (500 mg total) by mouth 2 (two) times daily.   . clotrimazole-betamethasone (LOTRISONE) cream Apply 1 application topically 2 (two) times daily.    . Ferrous Gluconate (IRON 27 PO) Take 27 mg by mouth.   . folic acid (FOLVITE) 1 MG tablet Take 2 tablets (2 mg total) by mouth daily.   Marland Kitchen glimepiride (AMARYL) 4 MG tablet Take 1/2 tablet with breakfast and 1/2 tablet with dinner   . glucose blood (ONETOUCH VERIO) test strip Use as directed  For twice a day diag e11.65   . hydrALAZINE (APRESOLINE) 10 MG tablet Take 1 tablet (10 mg total) by mouth 3 (three) times daily.   . meloxicam (MOBIC) 7.5 MG tablet TAKE 1 TABLET EVERY MORNINGFOR ARTHRITIS   . methotrexate 2.5 MG tablet Take 15 mg by mouth daily. 03/31/2017: Patient takes 6 tablets every Saturday  . pantoprazole (PROTONIX) 40 MG tablet TAKE 1 TABLET DAILY FOR    REFLUX   . traMADol (ULTRAM) 50 MG tablet Take 50 mg by mouth 2 (two) times daily.   . [DISCONTINUED] cephALEXin (KEFLEX) 500 MG capsule Take 1 capsule (500 mg total) by mouth 2 (two) times daily.    No facility-administered encounter medications on file as of 08/23/2018.     Surgical History: Past Surgical History:  Procedure Laterality Date  . ABDOMINAL HYSTERECTOMY    . COLONOSCOPY WITH PROPOFOL N/A 10/25/2017   Procedure: COLONOSCOPY WITH PROPOFOL;  Surgeon: Lollie Sails, MD;  Location: [facility] ENDOSCOPY;  Service: Endoscopy;  Laterality: N/A;  . ESOPHAGOGASTRODUODENOSCOPY (EGD) WITH PROPOFOL N/A 10/25/2017   Procedure: ESOPHAGOGASTRODUODENOSCOPY (EGD) WITH PROPOFOL;  Surgeon: Lollie Sails, MD;  Location: [facility] ENDOSCOPY;  Service: Endoscopy;  Laterality: N/A;  . EUS N/A 01/17/2018   Procedure: ESOPHAGEAL ENDOSCOPIC ULTRASOUND (EUS) RADIAL;  Surgeon: Reita Cliche, MD;  Location: [facility] ENDOSCOPY;  Service: Gastroenterology;  Laterality: N/A;  . right side had fatty tissue taken off    . TONSILLECTOMY      Medical History: Past Medical History:  Diagnosis Date  . Arthritis   . Diabetes mellitus without complication ([place])   . GERD (gastroesophageal reflux disease)   . Hypertension     Family  History: Family History  Problem Relation Age of Onset  . Breast cancer Neg Hx     Social History   Socioeconomic History  . Marital status: Married    Spouse name: Not on file  . Number of children: Not on file  . Years of education: Not on file  . Highest education level: Not on file  Occupational History  . Not on file  Social Needs  . Financial resource strain: Not on file  . Food insecurity:    Worry: Not on file    Inability: Not on file  . Transportation needs:    Medical: Not on file    Non-medical: Not on file  Tobacco Use  . Smoking status: Never Smoker  . Smokeless tobacco: Never Used  Substance and Sexual Activity  . Alcohol use: No  . Drug use: No  . Sexual activity: Not on file  Lifestyle  . Physical activity:    Days per week: Not on file    Minutes per session: Not on file  . Stress: Not on file  Relationships  . Social connections:    Talks on phone: Not on file    Gets together: Not on file    Attends religious service: Not on file    Active member of club or organization: Not on file    Attends meetings of clubs or organizations: Not on file    Relationship status: Not on file  . Intimate partner violence:    Fear of current or ex partner: Not on file    Emotionally abused: Not on file    Physically abused: Not on file    Forced sexual activity: Not on file  Other Topics Concern  . Not on file  Social History Narrative  . Not on file      Review of Systems  Constitutional: Positive for activity change, appetite change and fatigue. Negative for chills and unexpected weight change.  HENT: Negative for congestion, postnasal drip, rhinorrhea, sneezing and sore throat.   Respiratory: Negative for cough, chest tightness, shortness of breath and wheezing.   Cardiovascular: Negative for chest pain and palpitations.  Gastrointestinal: Negative for abdominal pain, constipation, diarrhea, nausea and vomiting.  Musculoskeletal: Positive for  arthralgias and myalgias. Negative for back pain, joint swelling and neck pain.       Left lower leg.   Skin: Negative for rash.       Swelling, redness, and warmth of skin of left lower leg.   Neurological: Negative for tremors and numbness.  Hematological: Negative for adenopathy. Does not bruise/bleed easily.  Psychiatric/Behavioral: Negative for behavioral problems (Depression), sleep disturbance and suicidal ideas. The patient is not nervous/anxious.     Today's Vitals   08/23/18 0939  BP: (!) 147/71  Pulse: (!) 56  Resp:  16  SpO2: 100%  Weight: 137 lb 9.6 oz (62.4 kg)  Height: 5\' 2"  (1.575 m)   Body mass index is 25.17 kg/m.  Physical Exam Vitals signs and nursing note reviewed.  Constitutional:      General: She is not in acute distress.    Appearance: Normal appearance. She is well-developed. She is not diaphoretic.  HENT:     Head: Normocephalic and atraumatic.     Mouth/Throat:     Pharynx: No oropharyngeal exudate.  Eyes:     Pupils: Pupils are equal, round, and reactive to light.  Neck:     Musculoskeletal: Normal range of motion and neck supple.     Thyroid: No thyromegaly.     Vascular: No JVD.     Trachea: No tracheal deviation.  Cardiovascular:     Rate and Rhythm: Normal rate and regular rhythm.     Heart sounds: Normal heart sounds. No murmur. No friction rub. No gallop.   Pulmonary:     Effort: Pulmonary effort is normal. No respiratory distress.     Breath sounds: Normal breath sounds. No wheezing or rales.  Chest:     Chest wall: No tenderness.  Abdominal:     Palpations: Abdomen is soft.     Tenderness: There is no abdominal tenderness. There is no guarding.  Musculoskeletal: Normal range of motion.     Comments: There is round, palpable know, just inferior to the patella of the left knee, and slightly medial. Tender to palpate. Evidence of bruising present.   Lymphadenopathy:     Cervical: No cervical adenopathy.  Skin:    General: Skin is  warm and dry.     Comments: Warm, red/bruising area noted to left tibia. Swelling and redness improved, however, area is still warm. Skin is intact with no drainage present.   Neurological:     Mental Status: She is alert and oriented to person, place, and time.     Cranial Nerves: No cranial nerve deficit.  Psychiatric:        Behavior: Behavior normal.        Thought Content: Thought content normal.        Judgment: Judgment normal.   Assessment/Plan: 1. Hematoma Reviewed venous ultrasound of left lower extremity which was negative for blood clot or DVT. Lump present likely to be hematoma which formed when dog ran into her left leg. Continues to improve, gradually. Monitor closely.   2. Cellulitis, unspecified cellulitis site Left lower leg. Improving. Will repeat round keflex 500mg  bid for additional 10 days. Monitor closely and notify office if worsening symptoms or symptoms do not improve.  - cephALEXin (KEFLEX) 500 MG capsule; Take 1 capsule (500 mg total) by mouth 2 (two) times daily.  Dispense: 20 capsule; Refill: 0  3. Essential hypertension Stable. Continue bp medication as prescribed   4. Uncontrolled type 2 diabetes mellitus with hyperglycemia ([place]) Discussed Freestyle Libre with the patient. She did not realize this device still pierces the skin to obtain blood sugars. States that she would like to continue checking sugars as she has been. Will try to find more information for patient and discuss again at her next visit. Continue diabetic medication as prescribed .  General Counseling: Shaely verbalizes understanding of the findings of todays visit and agrees with plan of treatment. I have discussed any further diagnostic evaluation that may be needed or ordered today. We also reviewed her medications today. she has been encouraged to call the office with any  questions or concerns that should arise related to todays visit.  This patient was seen by [place] with Dr Lavera Guise as a part of collaborative care agreement  Meds ordered this encounter  Medications  . cephALEXin (KEFLEX) 500 MG capsule    Sig: Take 1 capsule (500 mg total) by mouth 2 (two) times daily.    Dispense:  20 capsule    Refill:  0    Order Specific Question:   Supervising Provider    Answer:   Lavera Guise [[id number]]    Time spent: 18 Minutes      Dr Lavera Guise Internal medicine

## 2018-08-25 DIAGNOSIS — R69 Illness, unspecified: Secondary | ICD-10-CM | POA: Diagnosis not present

## 2018-09-16 DIAGNOSIS — M0579 Rheumatoid arthritis with rheumatoid factor of multiple sites without organ or systems involvement: Secondary | ICD-10-CM | POA: Diagnosis not present

## 2018-09-16 DIAGNOSIS — Z79899 Other long term (current) drug therapy: Secondary | ICD-10-CM | POA: Diagnosis not present

## 2018-09-23 DIAGNOSIS — B351 Tinea unguium: Secondary | ICD-10-CM | POA: Diagnosis not present

## 2018-09-23 DIAGNOSIS — R109 Unspecified abdominal pain: Secondary | ICD-10-CM | POA: Diagnosis not present

## 2018-09-23 DIAGNOSIS — M0579 Rheumatoid arthritis with rheumatoid factor of multiple sites without organ or systems involvement: Secondary | ICD-10-CM | POA: Diagnosis not present

## 2018-09-23 DIAGNOSIS — E119 Type 2 diabetes mellitus without complications: Secondary | ICD-10-CM | POA: Diagnosis not present

## 2018-09-23 DIAGNOSIS — Z79899 Other long term (current) drug therapy: Secondary | ICD-10-CM | POA: Diagnosis not present

## 2018-09-23 DIAGNOSIS — M79675 Pain in left toe(s): Secondary | ICD-10-CM | POA: Diagnosis not present

## 2018-09-23 DIAGNOSIS — M79674 Pain in right toe(s): Secondary | ICD-10-CM | POA: Diagnosis not present

## 2018-10-01 ENCOUNTER — Ambulatory Visit: Admit: 2018-10-01 | Payer: Medicare HMO | Admitting: Gastroenterology

## 2018-10-01 SURGERY — ESOPHAGOGASTRODUODENOSCOPY (EGD) WITH PROPOFOL
Anesthesia: General

## 2018-10-22 ENCOUNTER — Other Ambulatory Visit: Payer: Self-pay

## 2018-10-22 ENCOUNTER — Ambulatory Visit (INDEPENDENT_AMBULATORY_CARE_PROVIDER_SITE_OTHER): Payer: Medicare HMO | Admitting: Nurse Practitioner

## 2018-10-22 ENCOUNTER — Encounter: Payer: Self-pay | Admitting: Nurse Practitioner

## 2018-10-22 VITALS — BP 142/74 | HR 62 | Resp 16 | Ht 62.0 in | Wt 140.8 lb

## 2018-10-22 DIAGNOSIS — E1165 Type 2 diabetes mellitus with hyperglycemia: Secondary | ICD-10-CM

## 2018-10-22 DIAGNOSIS — R300 Dysuria: Secondary | ICD-10-CM | POA: Diagnosis not present

## 2018-10-22 DIAGNOSIS — Z0001 Encounter for general adult medical examination with abnormal findings: Secondary | ICD-10-CM

## 2018-10-22 DIAGNOSIS — M0579 Rheumatoid arthritis with rheumatoid factor of multiple sites without organ or systems involvement: Secondary | ICD-10-CM | POA: Diagnosis not present

## 2018-10-22 DIAGNOSIS — Z1239 Encounter for other screening for malignant neoplasm of breast: Secondary | ICD-10-CM | POA: Diagnosis not present

## 2018-10-22 DIAGNOSIS — I10 Essential (primary) hypertension: Secondary | ICD-10-CM | POA: Diagnosis not present

## 2018-10-22 LAB — POCT GLYCOSYLATED HEMOGLOBIN (HGB A1C): Hemoglobin A1C: 6 % — AB (ref 4.0–5.6)

## 2018-10-22 MED ORDER — AMLODIPINE BESYLATE 5 MG PO TABS: 10.0000 mg | ORAL_TABLET | Freq: Every evening | ORAL | 3 refills | Status: DC

## 2018-10-22 MED ORDER — GLIMEPIRIDE 4 MG PO TABS: ORAL_TABLET | ORAL | 3 refills | Status: DC

## 2018-10-22 NOTE — Progress Notes (Signed)
Pt blood pressure elevated, informed provider. Taken twice  1st reading 167/79 2nd reading 142/74

## 2018-10-22 NOTE — Progress Notes (Signed)
[facility] [place], [place] [address]  Internal MEDICINE  Office Visit Note  Patient Name: [name]  [id number]  [id number]  Date of Service: 10/22/2018   Pt is here for routine health maintenance examination   Chief Complaint  Patient presents with  . Medicare Wellness    pt have medication concerns   . Hypertension  . Diabetes  . Arthritis    pt states that arthritis is bothering her a lot in the past few days     The patient is here for health maintenance exam . She does have diabetes and blood sugars are well controlled. Her HgbA1c is 6.0 down from 6.36 at he last check. She does have rheumatoid arthritis. She has had increased joint pain, especially in her hands. rheumatologist has increased her methotrexate to 7 tablets on Saturday, up from six tablets. He has also added meloxicam 7.5mg  tablets when needed. She is to use this sparingly, as she had evidence of ulceration in esophagus and stomach on endoscopy.  She states that she has had some increased anxiety. Feels very fatigued. States that she has recently had some family stress and knows this is contributing to that. She has never taken anything to help with depression and anxiety and she is not ready to take anything for that right now. Advised her to contact the office if this worsens and we can discuss more and start medications if needed. She is in agreement with this plan.  She hs had a screening endoscopy scheduled, but this has been postponed several times due to fear of spread of COVID 19.    Current Medication: Outpatient Encounter Medications as of 10/22/2018  Medication Sig Note  . acetaminophen (TYLENOL) 500 MG tablet Take 500-1,000 mg by mouth every 6 (six) hours as needed.   . Alcohol Swabs (ALCOHOL PREP) PADS alcohol prep pads   . amLODipine (NORVASC) 5 MG tablet Take 2 tablets (10 mg total) by mouth every evening.   Marland Kitchen aspirin EC 81 MG tablet Take 81 mg by mouth daily.    Marland Kitchen atorvastatin (LIPITOR) 10 MG tablet TAKE 1 TABLET AT BEDTIME   FOR HIGH CHOLESTEROL   . Ferrous Gluconate (IRON 27 PO) Take 27 mg by mouth.   . folic acid (FOLVITE) 1 MG tablet Take 2 tablets (2 mg total) by mouth daily.   Marland Kitchen glimepiride (AMARYL) 4 MG tablet Take 1/2 tablet with breakfast and 1/2 tablet with dinner   . glucose blood (ONETOUCH VERIO) test strip Use as directed  For twice a day diag e11.65   . hydrALAZINE (APRESOLINE) 10 MG tablet Take 1 tablet (10 mg total) by mouth 3 (three) times daily.   . meloxicam (MOBIC) 7.5 MG tablet TAKE 1 TABLET EVERY MORNINGFOR ARTHRITIS   . methotrexate 2.5 MG tablet Take 15 mg by mouth daily. 03/31/2017: Patient takes 6 tablets every Saturday  . [DISCONTINUED] amLODipine (NORVASC) 5 MG tablet Take 2 tablets (10 mg total) by mouth every evening.   . [DISCONTINUED] glimepiride (AMARYL) 4 MG tablet Take 1/2 tablet with breakfast and 1/2 tablet with dinner   . cephALEXin (KEFLEX) 500 MG capsule Take 1 capsule (500 mg total) by mouth 2 (two) times daily. (Patient not taking: Reported on 10/22/2018)   . clotrimazole-betamethasone (LOTRISONE) cream Apply 1 application topically 2 (two) times daily. (Patient not taking: Reported on 10/22/2018)   . pantoprazole (PROTONIX) 40 MG tablet TAKE 1 TABLET DAILY FOR    REFLUX (Patient not taking: Reported  on 10/22/2018)   . traMADol (ULTRAM) 50 MG tablet Take 50 mg by mouth 2 (two) times daily.    No facility-administered encounter medications on file as of 10/22/2018.     Surgical History: Past Surgical History:  Procedure Laterality Date  . ABDOMINAL HYSTERECTOMY    . COLONOSCOPY WITH PROPOFOL N/A 10/25/2017   Procedure: COLONOSCOPY WITH PROPOFOL;  Surgeon: Lollie Sails, MD;  Location[facility] ENDOSCOPY;  Service: Endoscopy;  Laterality: N/A;  . ESOPHAGOGASTRODUODENOSCOPY (EGD) WITH PROPOFOL N/A 10/25/2017   Procedure: ESOPHAGOGASTRODUODENOSCOPY (EGD) WITH PROPOFOL;  Surgeon: Lollie Sails, MD;  Location:  [facility] ENDOSCOPY;  Service: Endoscopy;  Laterality: N/A;  . EUS N/A 01/17/2018   Procedure: ESOPHAGEAL ENDOSCOPIC ULTRASOUND (EUS) RADIAL;  Surgeon: Reita Cliche, MD;  Location: [facility] ENDOSCOPY;  Service: Gastroenterology;  Laterality: N/A;  . right side had fatty tissue taken off    . TONSILLECTOMY      Medical History: Past Medical History:  Diagnosis Date  . Arthritis   . Diabetes mellitus without complication ([place])   . GERD (gastroesophageal reflux disease)   . Hypertension     Family History: Family History  Problem Relation Age of Onset  . Breast cancer Neg Hx       Review of Systems  Constitutional: Positive for fatigue. Negative for activity change, chills and unexpected weight change.       Weight gain of 3 pounds since her last routine visit .  HENT: Negative for congestion, postnasal drip, rhinorrhea, sneezing and sore throat.   Respiratory: Negative for cough, chest tightness and shortness of breath.   Cardiovascular: Negative for chest pain and palpitations.  Gastrointestinal: Negative for abdominal pain, constipation, diarrhea, nausea and vomiting.  Endocrine: Negative for cold intolerance, heat intolerance, polydipsia and polyuria.       Blood sugars doing well   Musculoskeletal: Positive for arthralgias, joint swelling and myalgias. Negative for back pain and neck pain.  Skin: Negative for rash.  Allergic/Immunologic: Negative for environmental allergies, food allergies and immunocompromised state.  Neurological: Negative for dizziness, tremors, numbness and headaches.  Hematological: Negative for adenopathy. Does not bruise/bleed easily.  Psychiatric/Behavioral: Negative for behavioral problems (Depression), sleep disturbance and suicidal ideas. The patient is nervous/anxious.        Recent stress and anxiety due to family stress .     Today's Vitals   10/22/18 0840  BP: (!) 142/74  Pulse: 62  Resp: 16  SpO2: 100%  Weight: 140 lb 12.8 oz (63.9 kg)   Height: 5\' 2"  (1.575 m)   Body mass index is 25.75 kg/m.  Physical Exam Vitals signs and nursing note reviewed.  Constitutional:      General: She is not in acute distress.    Appearance: Normal appearance. She is well-developed. She is not diaphoretic.  HENT:     Head: Normocephalic and atraumatic.     Nose: Nose normal.     Mouth/Throat:     Pharynx: No oropharyngeal exudate.  Eyes:     Conjunctiva/sclera: Conjunctivae normal.     Pupils: Pupils are equal, round, and reactive to light.  Neck:     Musculoskeletal: Normal range of motion and neck supple.     Thyroid: No thyromegaly.     Vascular: No carotid bruit or JVD.     Trachea: No tracheal deviation.  Cardiovascular:     Rate and Rhythm: Normal rate and regular rhythm.     Pulses: Normal pulses.  Dorsalis pedis pulses are 2+ on the right side and 2+ on the left side.       Posterior tibial pulses are 2+ on the right side and 2+ on the left side.     Heart sounds: Murmur present. No friction rub. No gallop.   Pulmonary:     Effort: Pulmonary effort is normal. No respiratory distress.     Breath sounds: Normal breath sounds. No wheezing or rales.  Chest:     Chest wall: No tenderness.     Breasts:        Right: Normal. No swelling, bleeding, inverted nipple, mass, nipple discharge, skin change or tenderness.        Left: Normal. No swelling, bleeding, inverted nipple, mass, nipple discharge, skin change or tenderness.  Abdominal:     General: Bowel sounds are normal.     Palpations: Abdomen is soft.     Tenderness: There is no abdominal tenderness.  Musculoskeletal: Normal range of motion.     Right foot: Normal range of motion. No deformity.     Left foot: Normal range of motion. No deformity.     Comments: Generalized joint pain with arthritic nodules present, especially in the joints of the fingers and hands.   Feet:     Right foot:     Protective Sensation: 10 sites tested. 10 sites sensed.     Skin  integrity: Skin integrity normal.     Toenail Condition: Right toenails are normal.     Left foot:     Protective Sensation: 10 sites tested. 10 sites sensed.     Skin integrity: Skin integrity normal.     Toenail Condition: Left toenails are normal.  Lymphadenopathy:     Cervical: No cervical adenopathy.  Skin:    General: Skin is warm and dry.  Neurological:     General: No focal deficit present.     Mental Status: She is alert and oriented to person, place, and time. Mental status is at baseline.     Cranial Nerves: No cranial nerve deficit.  Psychiatric:        Behavior: Behavior normal.        Thought Content: Thought content normal.        Judgment: Judgment normal.    Depression screen [facility] 2/9 10/22/2018 08/23/2018 08/09/2018 07/25/2018 04/23/2018  Decreased Interest 0 0 0 0 0  Down, Depressed, Hopeless 0 0 1 0 0  PHQ - 2 Score 0 0 1 0 0    Functional Status Survey: Is the patient deaf or have difficulty hearing?: No Does the patient have difficulty seeing, even when wearing glasses/contacts?: No Does the patient have difficulty concentrating, remembering, or making decisions?: Yes(remembering) Does the patient have difficulty walking or climbing stairs?: No Does the patient have difficulty dressing or bathing?: No Does the patient have difficulty doing errands alone such as visiting a doctor's office or shopping?: No  MMSE - [place] Exam 10/22/2018 10/16/2017  Orientation to time 5 5  Orientation to Place 5 5  Registration 3 3  Attention/ Calculation 5 5  Recall 3 3  Language- name 2 objects 2 2  Language- repeat 1 1  Language- follow 3 step command 3 3  Language- read & follow direction 1 1  Write a sentence 1 1  Copy design 1 1  Total score 30 30    Fall Risk  10/22/2018 08/23/2018 08/09/2018 07/25/2018 04/23/2018  Falls in the past year? 0 0 0 0  0      LABS: Recent Results (from the past 2160 hour(s))  POCT HgB A1C     Status: Abnormal   Collection  Time: 07/25/18  9:03 AM  Result Value Ref Range   Hemoglobin A1C 6.5 (A) 4.0 - 5.6 %   HbA1c POC (<> result, manual entry)     HbA1c, POC (prediabetic range)     HbA1c, POC (controlled diabetic range)    Lipid Panel w/o Chol/HDL Ratio     Status: Abnormal   Collection Time: 07/25/18  4:24 PM  Result Value Ref Range   Cholesterol, Total 228 (H) 100 - 199 mg/dL   Triglycerides 81 0 - 149 mg/dL   HDL 92 >39 mg/dL   VLDL Cholesterol Cal 16 5 - 40 mg/dL   LDL Calculated 120 (H) 0 - 99 mg/dL  T4, free     Status: None   Collection Time: 07/25/18  4:24 PM  Result Value Ref Range   Free T4 1.39 0.82 - 1.77 ng/dL  T3     Status: None   Collection Time: 07/25/18  4:24 PM  Result Value Ref Range   T3, Total 102 71 - 180 ng/dL  TSH     Status: None   Collection Time: 07/25/18  4:24 PM  Result Value Ref Range   TSH 1.580 0.450 - 4.500 uIU/mL  Vitamin D (25 hydroxy)     Status: Abnormal   Collection Time: 07/25/18  4:24 PM  Result Value Ref Range   Vit D, 25-Hydroxy 11.1 (L) 30.0 - 100.0 ng/mL    Comment: Vitamin D deficiency has been defined by the [place] and an Endocrine Society practice guideline as a level of serum 25-OH vitamin D less than 20 ng/mL (1,2). The Endocrine Society went on to further define vitamin D insufficiency as a level between 21 and 29 ng/mL (2). 1. IOM (Institute of Medicine). 2010. Dietary reference    intakes for calcium and D. [place]: The    [employer]. 2. Holick MF, [place], Bischoff-Ferrari HA, et al.    Evaluation, treatment, and prevention of vitamin D    deficiency: an Endocrine Society clinical practice    guideline. JCEM. 2011 Jul; 96(7):1911-30.   POCT HgB A1C     Status: Abnormal   Collection Time: 10/22/18  9:00 AM  Result Value Ref Range   Hemoglobin A1C 6.0 (A) 4.0 - 5.6 %   HbA1c POC (<> result, manual entry)     HbA1c, POC (prediabetic range)     HbA1c, POC (controlled diabetic range)       Assessment/Plan: 1. Encounter for general adult medical examination with abnormal findings Annual health maintenance exam today.   2. Uncontrolled type 2 diabetes mellitus with hyperglycemia (HCC) - POCT HgB A1C 6.0. continue diabetic medication as prescribed.  - glimepiride (AMARYL) 4 MG tablet; Take 1/2 tablet with breakfast and 1/2 tablet with dinner  Dispense: 90 tablet; Refill: 3  3. Essential hypertension Well controlled. Continue BP medication as prescribed  - amLODipine (NORVASC) 5 MG tablet; Take 2 tablets (10 mg total) by mouth every evening.  Dispense: 180 tablet; Refill: 3  4. Rheumatoid arthritis involving multiple sites with positive rheumatoid factor (HCC) Regular visits with rheumatology as scheduled.   5. Screening for breast cancer - MM DIGITAL SCREENING BILATERAL; Future  6. Dysuria - UA/M w/rflx Culture, Routine  General Counseling: Ashlan verbalizes understanding of the findings of todays visit and agrees with plan of treatment.  I have discussed any further diagnostic evaluation that may be needed or ordered today. We also reviewed her medications today. she has been encouraged to call the office with any questions or concerns that should arise related to todays visit.    Counseling:  Hypertension Counseling:   The following hypertensive lifestyle modification were recommended and discussed:  1. Limiting alcohol intake to less than 1 oz/day of ethanol:(24 oz of beer or 8 oz of wine or 2 oz of 100-proof whiskey). 2. Take baby ASA 81 mg daily. 3. Importance of regular aerobic exercise and losing weight. 4. Reduce dietary saturated fat and cholesterol intake for overall cardiovascular health. 5. Maintaining adequate dietary potassium, calcium, and magnesium intake. 6. Regular monitoring of the blood pressure. 7. Reduce sodium intake to less than 100 mmol/day (less than 2.3 gm of sodium or less than 6 gm of sodium choride)   Diabetes Counseling:  1.  Addition of ACE inh/ ARB'S for nephroprotection. Microalbumin is updated  2. Diabetic foot care, prevention of complications. Podiatry consult 3. Exercise and lose weight.  4. Diabetic eye examination, Diabetic eye exam is updated  5. Monitor blood sugar closlely. nutrition counseling.  6. Sign and symptoms of hypoglycemia including shaking sweating,confusion and headaches.   This patient was seen by Leretha Pol FNP Collaboration with Dr Lavera Guise as a part of collaborative care agreement  Orders Placed This Encounter  Procedures  . MM DIGITAL SCREENING BILATERAL  . UA/M w/rflx Culture, Routine  . POCT HgB A1C    Meds ordered this encounter  Medications  . amLODipine (NORVASC) 5 MG tablet    Sig: Take 2 tablets (10 mg total) by mouth every evening.    Dispense:  180 tablet    Refill:  3    Patient taking 2 tablets. This is correct dosing.    Order Specific Question:   Supervising Provider    Answer:   Lavera Guise [[id number]]  . glimepiride (AMARYL) 4 MG tablet    Sig: Take 1/2 tablet with breakfast and 1/2 tablet with dinner    Dispense:  90 tablet    Refill:  3    Order Specific Question:   Supervising Provider    Answer:   Lavera Guise [[id number]]    Time spent: [place], MD  Internal Medicine

## 2018-10-23 LAB — UA/M W/RFLX CULTURE, ROUTINE
Bilirubin, UA: NEGATIVE
Glucose, UA: NEGATIVE
Ketones, UA: NEGATIVE
Leukocytes,UA: NEGATIVE
Nitrite, UA: NEGATIVE
RBC, UA: NEGATIVE
Specific Gravity, UA: 1.017 (ref 1.005–1.030)
Urobilinogen, Ur: 0.2 mg/dL (ref 0.2–1.0)
pH, UA: 5.5 (ref 5.0–7.5)

## 2018-10-23 LAB — MICROSCOPIC EXAMINATION
Bacteria, UA: NONE SEEN
Casts: NONE SEEN /lpf

## 2018-11-02 DIAGNOSIS — R69 Illness, unspecified: Secondary | ICD-10-CM | POA: Diagnosis not present

## 2018-11-16 ENCOUNTER — Emergency Department
Admission: EM | Admit: 2018-11-16 | Discharge: 2018-11-16 | Disposition: A | Discharge status: Home / Self Care | Payer: Medicare HMO | Attending: Emergency Medicine | Admitting: Emergency Medicine

## 2018-11-16 ENCOUNTER — Encounter: Payer: Self-pay | Admitting: Emergency Medicine

## 2018-11-16 ENCOUNTER — Other Ambulatory Visit: Payer: Self-pay

## 2018-11-16 DIAGNOSIS — Z7982 Long term (current) use of aspirin: Secondary | ICD-10-CM | POA: Diagnosis not present

## 2018-11-16 DIAGNOSIS — Z7984 Long term (current) use of oral hypoglycemic drugs: Secondary | ICD-10-CM | POA: Diagnosis not present

## 2018-11-16 DIAGNOSIS — M545 Low back pain: Secondary | ICD-10-CM | POA: Diagnosis not present

## 2018-11-16 DIAGNOSIS — M549 Dorsalgia, unspecified: Secondary | ICD-10-CM | POA: Insufficient documentation

## 2018-11-16 DIAGNOSIS — E119 Type 2 diabetes mellitus without complications: Secondary | ICD-10-CM | POA: Diagnosis not present

## 2018-11-16 DIAGNOSIS — I10 Essential (primary) hypertension: Secondary | ICD-10-CM | POA: Diagnosis not present

## 2018-11-16 DIAGNOSIS — G8929 Other chronic pain: Secondary | ICD-10-CM

## 2018-11-16 DIAGNOSIS — Z79899 Other long term (current) drug therapy: Secondary | ICD-10-CM | POA: Insufficient documentation

## 2018-11-16 MED ORDER — LIDOCAINE 5 % EX PTCH
2.0000 | MEDICATED_PATCH | CUTANEOUS | Status: DC
  Filled 2018-11-16 (×2): qty 2

## 2018-11-16 MED ORDER — KETOROLAC TROMETHAMINE 60 MG/2ML IM SOLN
15.0000 mg | Freq: Once | INTRAMUSCULAR | Status: AC
  Administered 2018-11-16: 09:00:00 15 mg via INTRAMUSCULAR
  Filled 2018-11-16: qty 2

## 2018-11-16 MED ORDER — LIDOCAINE 5 % EX PTCH: 1.0000 | MEDICATED_PATCH | Freq: Two times a day (BID) | CUTANEOUS | 0 refills | Status: AC

## 2018-11-16 NOTE — Discharge Instructions (Addendum)
Use Lidoderm patches as needed for pain.

## 2018-11-16 NOTE — ED Triage Notes (Signed)
Patient states that she has intermittent pain to her lower back that started tonight. Patient states that it feels like spasms and the pain is worse with movement. Patient denies urinary symptoms, nausea, vomiting or diarrhea.

## 2018-11-16 NOTE — ED Provider Notes (Signed)
[facility] Emergency Department Provider Note   ____________________________________________   First MD Initiated Contact with Patient 11/16/18 [phone]     (approximate)  I have reviewed the triage vital signs and the nursing notes.   HISTORY  Chief Complaint Back Pain    HPI [name] is a 78 y.o. female patient complains of intermittent back pain that worsened tonight.  Patient also complains of muscle spasm with movement.  Patient denies radicular component to her back pain.  Patient denies bladder or bowel dysfunction.  Patient denies nausea, vomiting, diarrhea.  Patient initially rates the pain as a 7/10.  Patient described pain is spasmatic and achy.  Patient state she was given a Lidoderm patch since she has been waiting for couple hours and pain has decreased to 5/10.  Patient has a history of arthritis.         Past Medical History:  Diagnosis Date  . Arthritis   . Diabetes mellitus without complication ([place])   . GERD (gastroesophageal reflux disease)   . Hypertension     Patient Active Problem List   Diagnosis Date Noted  . Screening for breast cancer 10/22/2018  . Hematoma 08/23/2018  . Cellulitis 08/23/2018  . Mixed hyperlipidemia 08/01/2018  . Vitamin D deficiency disease 08/01/2018  . Impingement syndrome of shoulder region 07/25/2018  . Other fatigue 07/25/2018  . Generalized anxiety disorder 04/23/2018  . Uncontrolled type 2 diabetes mellitus with hyperglycemia ([place]) 07/25/2017  . Osteoarthritis of knee 07/17/2017  . Shoulder pain 07/17/2017  . Essential hypertension 07/17/2017  . Controlled type 2 diabetes mellitus without complication, without long-term current use of insulin ([place]) 09/06/2016  . Rheumatoid arthritis involving multiple sites with positive rheumatoid factor ([place]) 09/06/2016    Past Surgical History:  Procedure Laterality Date  . ABDOMINAL HYSTERECTOMY    . COLONOSCOPY WITH PROPOFOL N/A 10/25/2017   Procedure: COLONOSCOPY WITH PROPOFOL;  Surgeon: Lollie Sails, MD;  Location: [facility] ENDOSCOPY;  Service: Endoscopy;  Laterality: N/A;  . ESOPHAGOGASTRODUODENOSCOPY (EGD) WITH PROPOFOL N/A 10/25/2017   Procedure: ESOPHAGOGASTRODUODENOSCOPY (EGD) WITH PROPOFOL;  Surgeon: Lollie Sails, MD;  Location: [facility] ENDOSCOPY;  Service: Endoscopy;  Laterality: N/A;  . EUS N/A 01/17/2018   Procedure: ESOPHAGEAL ENDOSCOPIC ULTRASOUND (EUS) RADIAL;  Surgeon: Reita Cliche, MD;  Location: [facility] ENDOSCOPY;  Service: Gastroenterology;  Laterality: N/A;  . right side had fatty tissue taken off    . TONSILLECTOMY      Prior to Admission medications   Medication Sig Start Date End Date Taking? Authorizing Provider  acetaminophen (TYLENOL) 500 MG tablet Take 500-1,000 mg by mouth every 6 (six) hours as needed.    [provider]  Alcohol Swabs (ALCOHOL PREP) PADS alcohol prep pads    [provider]  amLODipine (NORVASC) 5 MG tablet Take 2 tablets (10 mg total) by mouth every evening. 10/22/18   Ronnell Freshwater, NP  aspirin EC 81 MG tablet Take 81 mg by mouth daily.    [provider]  atorvastatin (LIPITOR) 10 MG tablet TAKE 1 TABLET AT BEDTIME   FOR HIGH CHOLESTEROL 06/10/18   Ronnell Freshwater, NP  cephALEXin (KEFLEX) 500 MG capsule Take 1 capsule (500 mg total) by mouth 2 (two) times daily. Patient not taking: Reported on 10/22/2018 08/23/18   Ronnell Freshwater, NP  clotrimazole-betamethasone (LOTRISONE) cream Apply 1 application topically 2 (two) times daily. Patient not taking: Reported on 10/22/2018 10/16/17   Lavera Guise, MD  Ferrous Gluconate (IRON 27  PO) Take 27 mg by mouth.    [provider]  folic acid (FOLVITE) 1 MG tablet Take 2 tablets (2 mg total) by mouth daily. 07/25/18   Ronnell Freshwater, NP  glimepiride (AMARYL) 4 MG tablet Take 1/2 tablet with breakfast and 1/2 tablet with dinner 10/22/18   Ronnell Freshwater, NP  glucose blood (ONETOUCH VERIO) test  strip Use as directed  For twice a day diag e11.65 06/13/18   Ronnell Freshwater, NP  hydrALAZINE (APRESOLINE) 10 MG tablet Take 1 tablet (10 mg total) by mouth 3 (three) times daily. 06/12/18   Ronnell Freshwater, NP  lidocaine (LIDODERM) 5 % Place 1 patch onto the skin every 12 (twelve) hours. Remove & Discard patch within 12 hours or as directed by MD 11/16/18 11/16/19  Sable Feil, PA-C  meloxicam (MOBIC) 7.5 MG tablet TAKE 1 TABLET EVERY [facility] ARTHRITIS 09/06/17   Ronnell Freshwater, NP  methotrexate 2.5 MG tablet Take 15 mg by mouth daily.    [provider]  pantoprazole (PROTONIX) 40 MG tablet TAKE 1 TABLET DAILY FOR    REFLUX Patient not taking: Reported on 10/22/2018 12/21/17   Ronnell Freshwater, NP  traMADol (ULTRAM) 50 MG tablet Take 50 mg by mouth 2 (two) times daily.    [provider]    Allergies Patient has no known allergies.  Family History  Problem Relation Age of Onset  . Breast cancer Neg Hx     Social History Social History   Tobacco Use  . Smoking status: Never Smoker  . Smokeless tobacco: Never Used  Substance Use Topics  . Alcohol use: No  . Drug use: No    Review of Systems  Constitutional: No fever/chills Eyes: No visual changes. ENT: No sore throat. Cardiovascular: Denies chest pain. Respiratory: Denies shortness of breath. Gastrointestinal: No abdominal pain.  No nausea, no vomiting.  No diarrhea.  No constipation. Genitourinary: Negative for dysuria. Musculoskeletal: Positive for back pain. Skin: Negative for rash. Neurological: Negative for headaches, focal weakness or numbness.  ____________________________________________   PHYSICAL EXAM:  VITAL SIGNS: ED Triage Vitals  Enc Vitals Group     BP 11/16/18 0205 (!) 163/72     Pulse Rate 11/16/18 0205 62     Resp 11/16/18 0205 18     Temp 11/16/18 0205 98.6 F (37 C)     Temp Source 11/16/18 0205 Oral     SpO2 11/16/18 0205 100 %     Weight 11/16/18 0206 137 lb  (62.1 kg)     Height 11/16/18 0206 5\' 2"  (1.575 m)     Head Circumference --      Peak Flow --      Pain Score 11/16/18 0206 7     Pain Loc --      Pain Edu? --      Excl. in [place]? --    Constitutional: Alert and oriented. Well appearing and in no acute distress. Neck: No cervical spine tenderness to palpation. Hematological/Lymphatic/Immunilogical: No cervical lymphadenopathy. Cardiovascular: Normal rate, regular rhythm. Grossly normal heart sounds.  Good peripheral circulation. Respiratory: Normal respiratory effort.  No retractions. Lungs CTAB. Gastrointestinal: Soft and nontender. No distention. No abdominal bruits. No CVA tenderness. Genitourinary: Deferred Musculoskeletal: No obvious spinal deformity.  Patient is moderate guarding palpation of L3-S1.  Patient decreased range of motion with flexion.  No lower extremity tenderness nor edema.  No joint effusions.  Bilateral negative straight leg test. Neurologic:  Normal speech and language.  No gross focal neurologic deficits are appreciated. No gait instability. Skin:  Skin is warm, dry and intact. No rash noted. Psychiatric: Mood and affect are normal. Speech and behavior are normal.  ____________________________________________   LABS (all labs ordered are listed, but only abnormal results are displayed)  Labs Reviewed - No data to display ____________________________________________  EKG   ____________________________________________  RADIOLOGY  ED MD interpretation:    Official radiology report(s): No results found.  ____________________________________________   PROCEDURES  Procedure(s) performed (including Critical Care):  Procedures   ____________________________________________   INITIAL IMPRESSION / ASSESSMENT AND PLAN / ED COURSE  As part of my medical decision making, I reviewed the following data within the [place]        She presents with increased low back pain secondary  to running get out of bed last night.  Patient denies radicular component to her back pain.  There is no bladder or bowel dysfunction.  Patient's describes the pain is spasmatic/achy.  Patient state moderate relief status post Lidoderm patch.  Patient will follow PCP telephonically on Monday.      ____________________________________________   FINAL CLINICAL IMPRESSION(S) / ED DIAGNOSES  Final diagnoses:  Other chronic back pain     ED Discharge Orders         Ordered    lidocaine (LIDODERM) 5 %  Every 12 hours     11/16/18 0806           Note:  This document was prepared using Dragon voice recognition software and may include unintentional dictation errors.    Sable Feil, PA-C 11/16/18 0962    Nena Polio, MD 11/16/18 [phone]

## 2018-12-23 DIAGNOSIS — M0579 Rheumatoid arthritis with rheumatoid factor of multiple sites without organ or systems involvement: Secondary | ICD-10-CM | POA: Diagnosis not present

## 2018-12-23 DIAGNOSIS — Z79899 Other long term (current) drug therapy: Secondary | ICD-10-CM | POA: Diagnosis not present

## 2018-12-25 DIAGNOSIS — H40003 Preglaucoma, unspecified, bilateral: Secondary | ICD-10-CM | POA: Diagnosis not present

## 2018-12-31 DIAGNOSIS — H40003 Preglaucoma, unspecified, bilateral: Secondary | ICD-10-CM | POA: Diagnosis not present

## 2019-01-02 ENCOUNTER — Encounter: Payer: Self-pay | Admitting: Nurse Practitioner

## 2019-01-10 ENCOUNTER — Ambulatory Visit
Admission: RE | Admit: 2019-01-10 | Discharge: 2019-01-10 | Disposition: A | Discharge status: Home / Self Care | Payer: Medicare HMO | Source: Ambulatory Visit | Attending: Nurse Practitioner | Admitting: Nurse Practitioner

## 2019-01-10 DIAGNOSIS — Z1231 Encounter for screening mammogram for malignant neoplasm of breast: Secondary | ICD-10-CM | POA: Diagnosis not present

## 2019-01-10 DIAGNOSIS — Z1239 Encounter for other screening for malignant neoplasm of breast: Secondary | ICD-10-CM

## 2019-01-10 NOTE — Progress Notes (Signed)
Negative mammogram

## 2019-01-12 DIAGNOSIS — R69 Illness, unspecified: Secondary | ICD-10-CM | POA: Diagnosis not present

## 2019-01-20 ENCOUNTER — Other Ambulatory Visit: Payer: Self-pay

## 2019-01-20 ENCOUNTER — Ambulatory Visit (INDEPENDENT_AMBULATORY_CARE_PROVIDER_SITE_OTHER): Payer: Medicare HMO | Admitting: Nurse Practitioner

## 2019-01-20 ENCOUNTER — Encounter: Payer: Self-pay | Admitting: Nurse Practitioner

## 2019-01-20 VITALS — BP 148/68 | HR 64 | Resp 16 | Ht 62.0 in | Wt 139.4 lb

## 2019-01-20 DIAGNOSIS — I10 Essential (primary) hypertension: Secondary | ICD-10-CM

## 2019-01-20 DIAGNOSIS — Z23 Encounter for immunization: Secondary | ICD-10-CM | POA: Diagnosis not present

## 2019-01-20 DIAGNOSIS — R011 Cardiac murmur, unspecified: Secondary | ICD-10-CM | POA: Insufficient documentation

## 2019-01-20 DIAGNOSIS — M79674 Pain in right toe(s): Secondary | ICD-10-CM | POA: Diagnosis not present

## 2019-01-20 DIAGNOSIS — B351 Tinea unguium: Secondary | ICD-10-CM | POA: Diagnosis not present

## 2019-01-20 DIAGNOSIS — E1165 Type 2 diabetes mellitus with hyperglycemia: Secondary | ICD-10-CM

## 2019-01-20 DIAGNOSIS — M79675 Pain in left toe(s): Secondary | ICD-10-CM | POA: Diagnosis not present

## 2019-01-20 LAB — POCT GLYCOSYLATED HEMOGLOBIN (HGB A1C): Hemoglobin A1C: 6 % — AB (ref 4.0–5.6)

## 2019-01-20 MED ORDER — ZOSTER VAC RECOMB ADJUVANTED 50 MCG/0.5ML IM SUSR: INTRAMUSCULAR | 1 refills | Status: DC

## 2019-01-20 NOTE — Progress Notes (Signed)
[facility] [place], [place] [address]  Internal MEDICINE  Office Visit Note  Patient Name: [name]  [id number]  [id number]  Date of Service: 01/20/2019  Chief Complaint  Patient presents with  . Follow-up  . Diabetes  . Hypertension    The patient is here for routine follow up visit. She states that blood sugars are higher in the mornings then they are in the evenings. They run mostly in the mid 100s in the mornings and in the 90s in the evenings.  Her HgbA1c is 6.0 today. She had diabetic Eye exam 01/02/2019 showing no diabetic retinopathy.  She is having some left knee pain. It is swollen. She has rheumatoid arthritis and is not due to see him again until October. She states the last time she saw then for knee pain, they drained some fluid off. This feels like how it felt before. She did get immediate relief from drawing off of fluid.       Current Medication: Outpatient Encounter Medications as of 01/20/2019  Medication Sig Note  . acetaminophen (TYLENOL) 500 MG tablet Take 500-1,000 mg by mouth every 6 (six) hours as needed.   . Alcohol Swabs (ALCOHOL PREP) PADS alcohol prep pads   . amLODipine (NORVASC) 5 MG tablet Take 2 tablets (10 mg total) by mouth every evening.   Marland Kitchen aspirin EC 81 MG tablet Take 81 mg by mouth daily.   Marland Kitchen atorvastatin (LIPITOR) 10 MG tablet TAKE 1 TABLET AT BEDTIME   FOR HIGH CHOLESTEROL   . Ferrous Gluconate (IRON 27 PO) Take 27 mg by mouth.   . folic acid (FOLVITE) 1 MG tablet Take 2 tablets (2 mg total) by mouth daily.   Marland Kitchen glimepiride (AMARYL) 4 MG tablet Take 1/2 tablet with breakfast and 1/2 tablet with dinner   . glucose blood (ONETOUCH VERIO) test strip Use as directed  For twice a day diag e11.65   . hydrALAZINE (APRESOLINE) 10 MG tablet Take 1 tablet (10 mg total) by mouth 3 (three) times daily.   Marland Kitchen lidocaine (LIDODERM) 5 % Place 1 patch onto the skin every 12 (twelve) hours. Remove & Discard patch within 12 hours  or as directed by MD   . meloxicam (MOBIC) 7.5 MG tablet TAKE 1 TABLET EVERY MORNINGFOR ARTHRITIS   . methotrexate 2.5 MG tablet Take 15 mg by mouth daily. 03/31/2017: Patient takes 6 tablets every Saturday  . pantoprazole (PROTONIX) 40 MG tablet TAKE 1 TABLET DAILY FOR    REFLUX   . Zoster Vaccine Adjuvanted [facility]) injection Shingles vaccine series - inject IM as directed .   . [DISCONTINUED] cephALEXin (KEFLEX) 500 MG capsule Take 1 capsule (500 mg total) by mouth 2 (two) times daily. (Patient not taking: Reported on 10/22/2018)   . [DISCONTINUED] clotrimazole-betamethasone (LOTRISONE) cream Apply 1 application topically 2 (two) times daily. (Patient not taking: Reported on 10/22/2018)   . [DISCONTINUED] traMADol (ULTRAM) 50 MG tablet Take 50 mg by mouth 2 (two) times daily.    No facility-administered encounter medications on file as of 01/20/2019.     Surgical History: Past Surgical History:  Procedure Laterality Date  . ABDOMINAL HYSTERECTOMY    . COLONOSCOPY WITH PROPOFOL N/A 10/25/2017   Procedure: COLONOSCOPY WITH PROPOFOL;  Surgeon: Lollie Sails, MD;  Location: [facility] ENDOSCOPY;  Service: Endoscopy;  Laterality: N/A;  . ESOPHAGOGASTRODUODENOSCOPY (EGD) WITH PROPOFOL N/A 10/25/2017   Procedure: ESOPHAGOGASTRODUODENOSCOPY (EGD) WITH PROPOFOL;  Surgeon: Lollie Sails, MD;  Location: [facility] ENDOSCOPY;  Service: Endoscopy;  Laterality: N/A;  . EUS N/A 01/17/2018   Procedure: ESOPHAGEAL ENDOSCOPIC ULTRASOUND (EUS) RADIAL;  Surgeon: Reita Cliche, MD;  Location: [facility] ENDOSCOPY;  Service: Gastroenterology;  Laterality: N/A;  . right side had fatty tissue taken off    . TONSILLECTOMY      Medical History: Past Medical History:  Diagnosis Date  . Arthritis   . Diabetes mellitus without complication ([place])   . GERD (gastroesophageal reflux disease)   . Hypertension     Family History: Family History  Problem Relation Age of Onset  . Breast cancer Neg Hx     Social History    Socioeconomic History  . Marital status: Married    Spouse name: Not on file  . Number of children: Not on file  . Years of education: Not on file  . Highest education level: Not on file  Occupational History  . Not on file  Social Needs  . Financial resource strain: Not on file  . Food insecurity    Worry: Not on file    Inability: Not on file  . Transportation needs    Medical: Not on file    Non-medical: Not on file  Tobacco Use  . Smoking status: Never Smoker  . Smokeless tobacco: Never Used  Substance and Sexual Activity  . Alcohol use: No  . Drug use: No  . Sexual activity: Not on file  Lifestyle  . Physical activity    Days per week: Not on file    Minutes per session: Not on file  . Stress: Not on file  Relationships  . Social Herbalist on phone: Not on file    Gets together: Not on file    Attends religious service: Not on file    Active member of club or organization: Not on file    Attends meetings of clubs or organizations: Not on file    Relationship status: Not on file  . Intimate partner violence    Fear of current or ex partner: Not on file    Emotionally abused: Not on file    Physically abused: Not on file    Forced sexual activity: Not on file  Other Topics Concern  . Not on file  Social History Narrative  . Not on file      Review of Systems  Constitutional: Negative for activity change, chills, fatigue and unexpected weight change.  HENT: Negative for congestion, postnasal drip, rhinorrhea, sneezing and sore throat.   Respiratory: Negative for cough, chest tightness, shortness of breath and wheezing.   Cardiovascular: Negative for chest pain and palpitations.       Mildly elevated blood pressure   Gastrointestinal: Negative for abdominal pain, constipation, diarrhea, nausea and vomiting.  Endocrine: Negative for cold intolerance, heat intolerance, polydipsia and polyuria.       Blood sugars doing well   Musculoskeletal:  Positive for arthralgias, joint swelling and myalgias. Negative for back pain and neck pain.       Left knee pain is tender and swollen.   Skin: Negative for rash.  Allergic/Immunologic: Negative for environmental allergies, food allergies and immunocompromised state.  Neurological: Negative for dizziness, tremors, numbness and headaches.  Hematological: Negative for adenopathy. Does not bruise/bleed easily.  Psychiatric/Behavioral: Negative for behavioral problems (Depression), sleep disturbance and suicidal ideas. The patient is nervous/anxious.        Recent stress and anxiety due to family stress .    Today's Vitals   01/20/19  0949  BP: (!) 148/68  Pulse: 64  Resp: 16  SpO2: 97%  Weight: 139 lb 6.4 oz (63.2 kg)  Height: 5\' 2"  (1.575 m)   Body mass index is 25.5 kg/m.  Physical Exam Vitals signs and nursing note reviewed.  Constitutional:      General: She is not in acute distress.    Appearance: Normal appearance. She is well-developed. She is not diaphoretic.  HENT:     Head: Normocephalic and atraumatic.     Nose: Nose normal.     Mouth/Throat:     Pharynx: No oropharyngeal exudate.  Eyes:     Conjunctiva/sclera: Conjunctivae normal.     Pupils: Pupils are equal, round, and reactive to light.  Neck:     Musculoskeletal: Normal range of motion and neck supple.     Thyroid: No thyromegaly.     Vascular: No carotid bruit or JVD.     Trachea: No tracheal deviation.  Cardiovascular:     Rate and Rhythm: Normal rate and regular rhythm.     Heart sounds: Murmur present. No friction rub. No gallop.   Pulmonary:     Effort: Pulmonary effort is normal. No respiratory distress.     Breath sounds: Normal breath sounds. No wheezing or rales.  Chest:     Chest wall: No tenderness.  Abdominal:     General: Bowel sounds are normal.     Palpations: Abdomen is soft.     Tenderness: There is no abdominal tenderness.  Musculoskeletal: Normal range of motion.     Comments:  Generalized joint pain with arthritic nodules present, especially in the joints of the fingers and hands. She also has left knee swelling and tenderness. ROM and strength are mildly diminished due to pain.   Lymphadenopathy:     Cervical: No cervical adenopathy.  Skin:    General: Skin is warm and dry.  Neurological:     Mental Status: She is alert and oriented to person, place, and time.     Cranial Nerves: No cranial nerve deficit.  Psychiatric:        Behavior: Behavior normal.        Thought Content: Thought content normal.        Judgment: Judgment normal.    Assessment/Plan: 1. Type 2 diabetes mellitus with hyperglycemia, unspecified whether long term insulin use (HCC) - POCT HgB A1C 6.0 today. Continue diabetic medication as prescribed   2. Essential hypertension Generally stable. Continue bp medication as prescribed   3. Murmur, cardiac Echocardiogram ordered for further evaluation.  - ECHOCARDIOGRAM COMPLETE; Future  4. Need for shingles vaccine shingrix vaccine ordered and sent to her pharmacy for administration.  - Zoster Vaccine Adjuvanted [facility]) injection; Shingles vaccine series - inject IM as directed .  Dispense: 0.5 mL; Refill: 1  General Counseling: Bretta verbalizes understanding of the findings of todays visit and agrees with plan of treatment. I have discussed any further diagnostic evaluation that may be needed or ordered today. We also reviewed her medications today. she has been encouraged to call the office with any questions or concerns that should arise related to todays visit.  Hypertension Counseling:   The following hypertensive lifestyle modification were recommended and discussed:  1. Limiting alcohol intake to less than 1 oz/day of ethanol:(24 oz of beer or 8 oz of wine or 2 oz of 100-proof whiskey). 2. Take baby ASA 81 mg daily. 3. Importance of regular aerobic exercise and losing weight. 4. Reduce dietary saturated fat and  cholesterol intake  for overall cardiovascular health. 5. Maintaining adequate dietary potassium, calcium, and magnesium intake. 6. Regular monitoring of the blood pressure. 7. Reduce sodium intake to less than 100 mmol/day (less than 2.3 gm of sodium or less than 6 gm of sodium choride)   This patient was seen by [place] with Dr Lavera Guise as a part of collaborative care agreement  Orders Placed This Encounter  Procedures  . POCT HgB A1C  . ECHOCARDIOGRAM COMPLETE    Meds ordered this encounter  Medications  . Zoster Vaccine Adjuvanted [facility]) injection    Sig: Shingles vaccine series - inject IM as directed .    Dispense:  0.5 mL    Refill:  1    Order Specific Question:   Supervising Provider    Answer:   Lavera Guise [id number]    Time spent: 98 Minutes      Dr Lavera Guise Internal medicine

## 2019-01-24 ENCOUNTER — Ambulatory Visit: Payer: Medicare HMO

## 2019-01-24 DIAGNOSIS — R011 Cardiac murmur, unspecified: Secondary | ICD-10-CM | POA: Diagnosis not present

## 2019-01-31 DIAGNOSIS — R69 Illness, unspecified: Secondary | ICD-10-CM | POA: Diagnosis not present

## 2019-02-06 DIAGNOSIS — K921 Melena: Secondary | ICD-10-CM | POA: Diagnosis not present

## 2019-02-06 DIAGNOSIS — R1013 Epigastric pain: Secondary | ICD-10-CM | POA: Diagnosis not present

## 2019-02-06 DIAGNOSIS — K3189 Other diseases of stomach and duodenum: Secondary | ICD-10-CM | POA: Diagnosis not present

## 2019-02-14 ENCOUNTER — Ambulatory Visit (INDEPENDENT_AMBULATORY_CARE_PROVIDER_SITE_OTHER): Payer: Medicare HMO | Admitting: Nurse Practitioner

## 2019-02-14 ENCOUNTER — Other Ambulatory Visit: Payer: Self-pay

## 2019-02-14 ENCOUNTER — Encounter: Payer: Self-pay | Admitting: Nurse Practitioner

## 2019-02-14 VITALS — BP 162/69 | HR 55 | Temp 97.5°F | Resp 16 | Ht 62.0 in | Wt 136.6 lb

## 2019-02-14 DIAGNOSIS — R0989 Other specified symptoms and signs involving the circulatory and respiratory systems: Secondary | ICD-10-CM | POA: Diagnosis not present

## 2019-02-14 DIAGNOSIS — I10 Essential (primary) hypertension: Secondary | ICD-10-CM

## 2019-02-14 DIAGNOSIS — E1165 Type 2 diabetes mellitus with hyperglycemia: Secondary | ICD-10-CM | POA: Diagnosis not present

## 2019-02-14 DIAGNOSIS — I059 Rheumatic mitral valve disease, unspecified: Secondary | ICD-10-CM | POA: Diagnosis not present

## 2019-02-14 DIAGNOSIS — R011 Cardiac murmur, unspecified: Secondary | ICD-10-CM | POA: Diagnosis not present

## 2019-02-14 DIAGNOSIS — I38 Endocarditis, valve unspecified: Secondary | ICD-10-CM | POA: Insufficient documentation

## 2019-02-14 NOTE — Progress Notes (Signed)
[facility] [place], [place] [address]  Internal MEDICINE  Office Visit Note  Patient Name: [name]  [id number]  [id number]  Date of Service: 02/14/2019  Chief Complaint  Patient presents with  . Follow-up    echo results    The patient is here for follow up of echocardiogram. She has a moderately loud murmur and had echocardiogram for further evaluation. Her echocardiogram indicates that she has hyperdynamic function of the hear with near cavity obliteration indicating volume depletion or dehydration. She also has moderate left ventricular hypertrophy with small left ventricular cavity size. She has moderate valvular regurgitation and mild calcification of the mitral and aortic valves. She has never had cardiology consult in the past.  She is scheduled for endoscopy 03/04/2019 with Dr. Gustavo Lah.        Current Medication: Outpatient Encounter Medications as of 02/14/2019  Medication Sig Note  . acetaminophen (TYLENOL) 500 MG tablet Take 500-1,000 mg by mouth every 6 (six) hours as needed.   . Alcohol Swabs (ALCOHOL PREP) PADS alcohol prep pads   . amLODipine (NORVASC) 5 MG tablet Take 2 tablets (10 mg total) by mouth every evening.   Marland Kitchen aspirin EC 81 MG tablet Take 81 mg by mouth daily.   Marland Kitchen atorvastatin (LIPITOR) 10 MG tablet TAKE 1 TABLET AT BEDTIME   FOR HIGH CHOLESTEROL   . Ferrous Gluconate (IRON 27 PO) Take 27 mg by mouth.   . folic acid (FOLVITE) 1 MG tablet Take 2 tablets (2 mg total) by mouth daily.   Marland Kitchen glimepiride (AMARYL) 4 MG tablet Take 1/2 tablet with breakfast and 1/2 tablet with dinner   . glucose blood (ONETOUCH VERIO) test strip Use as directed  For twice a day diag e11.65   . hydrALAZINE (APRESOLINE) 10 MG tablet Take 1 tablet (10 mg total) by mouth 3 (three) times daily.   Marland Kitchen lidocaine (LIDODERM) 5 % Place 1 patch onto the skin every 12 (twelve) hours. Remove & Discard patch within 12 hours or as directed by MD   . meloxicam  (MOBIC) 7.5 MG tablet TAKE 1 TABLET EVERY MORNINGFOR ARTHRITIS (Patient taking differently: Take 7.5 mg by mouth as needed. )   . methotrexate 2.5 MG tablet Take 15 mg by mouth daily. 03/31/2017: Patient takes 6 tablets every Saturday  . pantoprazole (PROTONIX) 40 MG tablet TAKE 1 TABLET DAILY FOR    REFLUX (Patient taking differently: Take 40 mg by mouth 2 (two) times daily before a meal. )   . Zoster Vaccine Adjuvanted [facility]) injection Shingles vaccine series - inject IM as directed .    No facility-administered encounter medications on file as of 02/14/2019.     Surgical History: Past Surgical History:  Procedure Laterality Date  . ABDOMINAL HYSTERECTOMY    . COLONOSCOPY WITH PROPOFOL N/A 10/25/2017   Procedure: COLONOSCOPY WITH PROPOFOL;  Surgeon: Lollie Sails, MD;  Location: [facility] ENDOSCOPY;  Service: Endoscopy;  Laterality: N/A;  . ESOPHAGOGASTRODUODENOSCOPY (EGD) WITH PROPOFOL N/A 10/25/2017   Procedure: ESOPHAGOGASTRODUODENOSCOPY (EGD) WITH PROPOFOL;  Surgeon: Lollie Sails, MD;  Location: [facility] ENDOSCOPY;  Service: Endoscopy;  Laterality: N/A;  . EUS N/A 01/17/2018   Procedure: ESOPHAGEAL ENDOSCOPIC ULTRASOUND (EUS) RADIAL;  Surgeon: Reita Cliche, MD;  Location: [facility] ENDOSCOPY;  Service: Gastroenterology;  Laterality: N/A;  . right side had fatty tissue taken off    . TONSILLECTOMY      Medical History: Past Medical History:  Diagnosis Date  . Arthritis   .  Diabetes mellitus without complication ([place])   . GERD (gastroesophageal reflux disease)   . Hypertension     Family History: Family History  Problem Relation Age of Onset  . Breast cancer Neg Hx     Social History   Socioeconomic History  . Marital status: Married    Spouse name: Not on file  . Number of children: Not on file  . Years of education: Not on file  . Highest education level: Not on file  Occupational History  . Not on file  Social Needs  . Financial resource strain: Not on file  .  Food insecurity    Worry: Not on file    Inability: Not on file  . Transportation needs    Medical: Not on file    Non-medical: Not on file  Tobacco Use  . Smoking status: Never Smoker  . Smokeless tobacco: Never Used  Substance and Sexual Activity  . Alcohol use: No  . Drug use: No  . Sexual activity: Not on file  Lifestyle  . Physical activity    Days per week: Not on file    Minutes per session: Not on file  . Stress: Not on file  Relationships  . Social Herbalist on phone: Not on file    Gets together: Not on file    Attends religious service: Not on file    Active member of club or organization: Not on file    Attends meetings of clubs or organizations: Not on file    Relationship status: Not on file  . Intimate partner violence    Fear of current or ex partner: Not on file    Emotionally abused: Not on file    Physically abused: Not on file    Forced sexual activity: Not on file  Other Topics Concern  . Not on file  Social History Narrative  . Not on file      Review of Systems  Constitutional: Negative for activity change, chills, fatigue and unexpected weight change.  HENT: Negative for congestion, postnasal drip, rhinorrhea, sneezing and sore throat.   Respiratory: Negative for cough, chest tightness, shortness of breath and wheezing.   Cardiovascular: Negative for chest pain and palpitations.       Mildly elevated blood pressure   Gastrointestinal: Negative for abdominal pain, constipation, diarrhea, nausea and vomiting.  Endocrine: Negative for cold intolerance, heat intolerance, polydipsia and polyuria.       Blood sugars doing well   Musculoskeletal: Positive for arthralgias, joint swelling and myalgias. Negative for back pain and neck pain.       Left knee improving   Skin: Negative for rash.  Allergic/Immunologic: Negative for environmental allergies, food allergies and immunocompromised state.  Neurological: Negative for dizziness,  tremors, numbness and headaches.  Hematological: Negative for adenopathy. Does not bruise/bleed easily.  Psychiatric/Behavioral: Negative for behavioral problems (Depression), sleep disturbance and suicidal ideas. The patient is nervous/anxious.    Today's Vitals   02/14/19 0842  BP: (!) 162/69  Pulse: (!) 55  Resp: 16  Temp: (!) 97.5 F (36.4 C)  SpO2: 98%  Weight: 136 lb 9.6 oz (62 kg)  Height: 5\' 2"  (1.575 m)   Body mass index is 24.98 kg/m.  Physical Exam Vitals signs and nursing note reviewed.  Constitutional:      General: She is not in acute distress.    Appearance: Normal appearance. She is well-developed. She is not diaphoretic.  HENT:     Head:  Normocephalic and atraumatic.     Nose: Nose normal.     Mouth/Throat:     Pharynx: No oropharyngeal exudate.  Eyes:     Conjunctiva/sclera: Conjunctivae normal.     Pupils: Pupils are equal, round, and reactive to light.  Neck:     Musculoskeletal: Normal range of motion and neck supple.     Thyroid: No thyromegaly.     Vascular: No carotid bruit or JVD.     Trachea: No tracheal deviation.  Cardiovascular:     Rate and Rhythm: Normal rate and regular rhythm.     Heart sounds: Murmur present. No friction rub. No gallop.   Pulmonary:     Effort: Pulmonary effort is normal. No respiratory distress.     Breath sounds: Normal breath sounds. No wheezing or rales.  Chest:     Chest wall: No tenderness.  Abdominal:     General: Bowel sounds are normal.     Palpations: Abdomen is soft.     Tenderness: There is no abdominal tenderness.  Musculoskeletal: Normal range of motion.     Comments: Generalized joint pain with arthritic nodules present, especially in the joints of the fingers and hands. She also has left knee swelling and tenderness. ROM and strength are mildly diminished due to pain.   Lymphadenopathy:     Cervical: No cervical adenopathy.  Skin:    General: Skin is warm and dry.  Neurological:     Mental  Status: She is alert and oriented to person, place, and time.     Cranial Nerves: No cranial nerve deficit.  Psychiatric:        Behavior: Behavior normal.        Thought Content: Thought content normal.        Judgment: Judgment normal.    Assessment/Plan: 1. Mitral valvular disorder Reviewed echocardiogram with the patient. Moderate mitral valve calcification with moderate regurgitation. Refer to cardology for further evaluation.  - Ambulatory referral to Cardiology  2. Hyperdynamic circulation Echocardiogram indicates hyperdynamic state with near cavity obliteration indicating volume depletion/dehydration. Refer to cardiology for further evaluation.  - Ambulatory referral to Cardiology  3. Murmur, cardiac Refer to cardiology  4. Essential hypertension Stable.   5. Type 2 diabetes mellitus with hyperglycemia, without long-term current use of insulin (HCC) Stable.   General Counseling: Surina verbalizes understanding of the findings of todays visit and agrees with plan of treatment. I have discussed any further diagnostic evaluation that may be needed or ordered today. We also reviewed her medications today. she has been encouraged to call the office with any questions or concerns that should arise related to todays visit.    Orders Placed This Encounter  Procedures  . Ambulatory referral to Cardiology   This patient was seen by Leretha Pol FNP Collaboration with Dr Lavera Guise as a part of collaborative care agreement   Time spent: 25 Minutes      Dr Lavera Guise Internal medicine

## 2019-02-23 NOTE — Progress Notes (Signed)
New Outpatient Visit Date: 02/24/2019  Referring Provider: Ronnell Freshwater, NP [address] [place],   [address]  Chief Complaint: Abnormal echocardiogram  HPI:  Ms. Malcolm is a 78 y.o. female who is being seen today for the evaluation of mitral valve disorder at the request of Ms. Boscia. She has a history of hypertension, diabetes mellitus, GERD, and arthritis. Outside echo last month showed moderate LVH with hyperdynamic LV contraction, mild AS, moderate AI, and mild MR. Ms. Berteau was asymptomatic; echo was ordered due to heart murmur appreciated on exam.  She reports mild exertional dyspnea when walking uphill, which is stable.  She otherwise denies shortness of breath as well as chest pain, lightheadedness, and edema.  She notes brief palpitations lasting a second or 2 without accompanying symptoms.  She has noted a 38 pound weight loss (unintentional) over the last year.  She noted a poor appetite during much of this time, though it has improved somewhat.  She reports being up-to-date on her mammogram and colonoscopy.  --------------------------------------------------------------------------------------------------  Cardiovascular History & Procedures: Cardiovascular Problems:  Mixed valvular heart disease  Risk Factors:  Hypertension, diabetes mellitus, and age > 8  Cath/PCI:  None  CV Surgery:  None  EP Procedures and Devices:  None  Non-Invasive Evaluation(s):  TTE (01/24/2019): Small left ventricle with moderate LVH.  Hyperdynamic contraction (LVEF greater than 70%).  Mild aortic valve calcification with mild stenosis and moderate regurgitation.  Moderate mitral annular calcification with mild regurgitation.  Mild tricuspid regurgitation.  Mild pulmonary hypertension.  Recent CV Pertinent Labs: Lab Results  Component Value Date   CHOL 228 (H) 07/25/2018   HDL 92 07/25/2018   LDLCALC 120 (H) 07/25/2018   TRIG 81 07/25/2018   K 3.9 10/16/2017   BUN  12 10/16/2017   CREATININE 0.93 10/16/2017    --------------------------------------------------------------------------------------------------  Past Medical History:  Diagnosis Date   Arthritis    Diabetes mellitus without complication (HCC)    GERD (gastroesophageal reflux disease)    Hypertension     Past Surgical History:  Procedure Laterality Date   ABDOMINAL HYSTERECTOMY     COLONOSCOPY WITH PROPOFOL N/A 10/25/2017   Procedure: COLONOSCOPY WITH PROPOFOL;  Surgeon: Lollie Sails, MD;  Location: [facility] ENDOSCOPY;  Service: Endoscopy;  Laterality: N/A;   ESOPHAGOGASTRODUODENOSCOPY (EGD) WITH PROPOFOL N/A 10/25/2017   Procedure: ESOPHAGOGASTRODUODENOSCOPY (EGD) WITH PROPOFOL;  Surgeon: Lollie Sails, MD;  Location: [facility] ENDOSCOPY;  Service: Endoscopy;  Laterality: N/A;   EUS N/A 01/17/2018   Procedure: ESOPHAGEAL ENDOSCOPIC ULTRASOUND (EUS) RADIAL;  Surgeon: Reita Cliche, MD;  Location: [facility] ENDOSCOPY;  Service: Gastroenterology;  Laterality: N/A;   right side had fatty tissue taken off     TONSILLECTOMY      Current Meds  Medication Sig   acetaminophen (TYLENOL) 500 MG tablet Take 500-1,000 mg by mouth every 6 (six) hours as needed.   Alcohol Swabs (ALCOHOL PREP) PADS alcohol prep pads   amLODipine (NORVASC) 5 MG tablet Take 2 tablets (10 mg total) by mouth every evening.   aspirin EC 81 MG tablet Take 81 mg by mouth daily.   atorvastatin (LIPITOR) 10 MG tablet TAKE 1 TABLET AT BEDTIME   FOR HIGH CHOLESTEROL   Ferrous Gluconate (IRON 27 PO) Take 27 mg by mouth.   folic acid (FOLVITE) 1 MG tablet Take 2 tablets (2 mg total) by mouth daily.   glimepiride (AMARYL) 4 MG tablet Take 1/2 tablet with breakfast and 1/2 tablet with dinner   glucose blood (  ONETOUCH VERIO) test strip Use as directed  For twice a day diag e11.65   hydrALAZINE (APRESOLINE) 10 MG tablet Take 1 tablet (10 mg total) by mouth 3 (three) times daily.   lidocaine (LIDODERM) 5 %  Place 1 patch onto the skin every 12 (twelve) hours. Remove & Discard patch within 12 hours or as directed by MD   meloxicam (MOBIC) 7.5 MG tablet TAKE 1 TABLET EVERY MORNINGFOR ARTHRITIS (Patient taking differently: Take 7.5 mg by mouth as needed. )   methotrexate 2.5 MG tablet Take 15 mg by mouth daily.   Zoster Vaccine Adjuvanted [facility]) injection Shingles vaccine series - inject IM as directed .    Allergies: Patient has no known allergies.  Social History   Tobacco Use   Smoking status: Never Smoker   Smokeless tobacco: Never Used  Substance Use Topics   Alcohol use: No   Drug use: No    Family History  Problem Relation Age of Onset   Hypertension Mother    Diabetes Father    Heart disease Brother 66       Open heart surgery (details unknown)   Breast cancer Neg Hx     Review of Systems: A 12-system review of systems was performed and was negative except as noted in the HPI.  --------------------------------------------------------------------------------------------------  Physical Exam: BP (!) 144/62 (BP Location: Right Arm, Patient Position: Sitting, Cuff Size: Normal)    Pulse (!) 58    Temp (!) 97.2 F (36.2 C)    Ht 5\' 2"  (1.575 m)    Wt 137 lb 4 oz (62.3 kg)    SpO2 98%    BMI 25.10 kg/m   General: NAD. HEENT: No conjunctival pallor or scleral icterus.  Facemask in place. Neck: Supple without lymphadenopathy, thyromegaly, JVD, or HJR. No carotid bruit. Lungs: Normal work of breathing. Clear to auscultation bilaterally without wheezes or crackles. Heart: Regular rate and rhythm with 3/6 systolic murmur heard across the precordium but loudest at the left lower sternal border.  No rubs or gallops.  Nondisplaced PMI. Abd: Bowel sounds present. Soft, NT/ND without hepatosplenomegaly Ext: No lower extremity edema. Radial, PT, and DP pulses are 2+ bilaterally Skin: Warm and dry without rash. Neuro: CNIII-XII intact. Strength and fine-touch sensation  intact in upper and lower extremities bilaterally. Psych: Normal mood and affect.  EKG: Sinus bradycardia with PVCs (heart rate 58 bpm), LVH, and anterior T wave inversions.  Lab Results  Component Value Date   WBC 4.4 03/31/2017   HGB 11.8 (L) 03/31/2017   HCT 36.7 03/31/2017   MCV 85.4 03/31/2017   PLT 165 03/31/2017    Lab Results  Component Value Date   NA 143 10/16/2017   K 3.9 10/16/2017   CL 100 10/16/2017   CO2 26 10/16/2017   BUN 12 10/16/2017   CREATININE 0.93 10/16/2017   GLUCOSE 110 (H) 10/16/2017   ALT 18 03/31/2017    Lab Results  Component Value Date   CHOL 228 (H) 07/25/2018   HDL 92 07/25/2018   LDLCALC 120 (H) 07/25/2018   TRIG 81 07/25/2018     --------------------------------------------------------------------------------------------------  ASSESSMENT AND PLAN: Mixed valvular heart disease: Recent echocardiogram showed mild to moderate regurgitation of the aortic, mitral, and tricuspid valves as well as mild aortic stenosis.  Other than mild chronic exertional dyspnea, Ms. Bayard has been asymptomatic.  Her EKG today shows borderline LVH with nonspecific T wave changes.  We have discussed the importance of blood pressure control and monitoring of  her symptoms.  We will defer additional testing at this time, though repeat echocardiogram will need to be considered in about a year (sooner if symptoms progress).  Hypertension: Blood pressure is mildly elevated today.  We discussed the importance of sodium restriction and pharmacotherapy.  For now, we have agreed to work on lifestyle modifications including sodium restriction.  I will not make any changes to her current regimen of amlodipine and hydralazine.  Unintentional weight loss: 38 pound weight loss reported over the last year.  I will defer further work-up to Ms. Boscia.  Follow-up: Return to clinic in 3 months.  Nelva Bush, MD 02/25/2019 9:09 PM

## 2019-02-24 ENCOUNTER — Encounter: Payer: Self-pay | Admitting: Internal Medicine

## 2019-02-24 ENCOUNTER — Other Ambulatory Visit: Payer: Self-pay

## 2019-02-24 ENCOUNTER — Ambulatory Visit (INDEPENDENT_AMBULATORY_CARE_PROVIDER_SITE_OTHER): Payer: Medicare HMO | Admitting: Internal Medicine

## 2019-02-24 VITALS — BP 144/62 | HR 58 | Temp 97.2°F | Ht 62.0 in | Wt 137.2 lb

## 2019-02-24 DIAGNOSIS — I10 Essential (primary) hypertension: Secondary | ICD-10-CM

## 2019-02-24 DIAGNOSIS — I38 Endocarditis, valve unspecified: Secondary | ICD-10-CM

## 2019-02-24 DIAGNOSIS — R634 Abnormal weight loss: Secondary | ICD-10-CM | POA: Diagnosis not present

## 2019-02-24 NOTE — Patient Instructions (Signed)
Medication Instructions:  Your physician recommends that you continue on your current medications as directed. Please refer to the Current Medication list given to you today.  If you need a refill on your cardiac medications before your next appointment, please call your pharmacy.   Lab work: NONE If you have labs (blood work) drawn today and your tests are completely normal, you will receive your results only by: Marland Kitchen MyChart Message (if you have MyChart) OR . A paper copy in the mail If you have any lab test that is abnormal or we need to change your treatment, we will call you to review the results.  Testing/Procedures: NONE  Follow-Up: At [facility], you and your health needs are our priority.  As part of our continuing mission to provide you with exceptional heart care, we have created designated Provider Care Teams.  These Care Teams include your primary Cardiologist (physician) and Advanced Practice Providers (APPs -  Physician Assistants and Nurse Practitioners) who all work together to provide you with the care you need, when you need it. You will need a follow up appointment in 3 months.  You may see DR Harrell Gave END or one of the following Advanced Practice Providers on your designated Care Team:   Murray Hodgkins, NP [name], PA-C . Marrianne Mood, PA-C   Info on DASH DIET provided.

## 2019-02-25 ENCOUNTER — Encounter: Payer: Self-pay | Admitting: Internal Medicine

## 2019-02-25 DIAGNOSIS — R634 Abnormal weight loss: Secondary | ICD-10-CM | POA: Insufficient documentation

## 2019-02-28 ENCOUNTER — Other Ambulatory Visit
Admission: RE | Admit: 2019-02-28 | Discharge: 2019-02-28 | Disposition: A | Discharge status: Home / Self Care | Payer: Medicare HMO | Source: Ambulatory Visit | Attending: Gastroenterology | Admitting: Gastroenterology

## 2019-02-28 DIAGNOSIS — Z01812 Encounter for preprocedural laboratory examination: Secondary | ICD-10-CM | POA: Insufficient documentation

## 2019-02-28 DIAGNOSIS — Z20828 Contact with and (suspected) exposure to other viral communicable diseases: Secondary | ICD-10-CM | POA: Insufficient documentation

## 2019-03-01 LAB — SARS CORONAVIRUS 2 (TAT 6-24 HRS): SARS Coronavirus 2: NEGATIVE

## 2019-03-04 ENCOUNTER — Ambulatory Visit: Payer: Medicare HMO | Admitting: Anesthesiology

## 2019-03-04 ENCOUNTER — Other Ambulatory Visit: Payer: Self-pay

## 2019-03-04 ENCOUNTER — Encounter
Admission: RE | Disposition: A | Discharge status: Home / Self Care | Payer: Self-pay | Source: Home / Self Care | Attending: Gastroenterology

## 2019-03-04 ENCOUNTER — Ambulatory Visit
Admission: RE | Admit: 2019-03-04 | Discharge: 2019-03-04 | Disposition: A | Discharge status: Home / Self Care | Payer: Medicare HMO | Attending: Gastroenterology | Admitting: Gastroenterology

## 2019-03-04 DIAGNOSIS — K317 Polyp of stomach and duodenum: Secondary | ICD-10-CM | POA: Insufficient documentation

## 2019-03-04 DIAGNOSIS — F419 Anxiety disorder, unspecified: Secondary | ICD-10-CM | POA: Insufficient documentation

## 2019-03-04 DIAGNOSIS — K449 Diaphragmatic hernia without obstruction or gangrene: Secondary | ICD-10-CM | POA: Diagnosis not present

## 2019-03-04 DIAGNOSIS — E119 Type 2 diabetes mellitus without complications: Secondary | ICD-10-CM | POA: Diagnosis not present

## 2019-03-04 DIAGNOSIS — I10 Essential (primary) hypertension: Secondary | ICD-10-CM | POA: Insufficient documentation

## 2019-03-04 DIAGNOSIS — M069 Rheumatoid arthritis, unspecified: Secondary | ICD-10-CM | POA: Insufficient documentation

## 2019-03-04 DIAGNOSIS — K3189 Other diseases of stomach and duodenum: Secondary | ICD-10-CM | POA: Diagnosis not present

## 2019-03-04 DIAGNOSIS — R69 Illness, unspecified: Secondary | ICD-10-CM | POA: Diagnosis not present

## 2019-03-04 DIAGNOSIS — Z7984 Long term (current) use of oral hypoglycemic drugs: Secondary | ICD-10-CM | POA: Insufficient documentation

## 2019-03-04 DIAGNOSIS — M179 Osteoarthritis of knee, unspecified: Secondary | ICD-10-CM | POA: Diagnosis not present

## 2019-03-04 DIAGNOSIS — K259 Gastric ulcer, unspecified as acute or chronic, without hemorrhage or perforation: Secondary | ICD-10-CM | POA: Insufficient documentation

## 2019-03-04 DIAGNOSIS — R1013 Epigastric pain: Secondary | ICD-10-CM | POA: Diagnosis not present

## 2019-03-04 DIAGNOSIS — E1165 Type 2 diabetes mellitus with hyperglycemia: Secondary | ICD-10-CM | POA: Diagnosis not present

## 2019-03-04 DIAGNOSIS — Z791 Long term (current) use of non-steroidal anti-inflammatories (NSAID): Secondary | ICD-10-CM | POA: Diagnosis not present

## 2019-03-04 DIAGNOSIS — Z79899 Other long term (current) drug therapy: Secondary | ICD-10-CM | POA: Diagnosis not present

## 2019-03-04 DIAGNOSIS — E785 Hyperlipidemia, unspecified: Secondary | ICD-10-CM | POA: Diagnosis not present

## 2019-03-04 DIAGNOSIS — K219 Gastro-esophageal reflux disease without esophagitis: Secondary | ICD-10-CM | POA: Insufficient documentation

## 2019-03-04 DIAGNOSIS — Z09 Encounter for follow-up examination after completed treatment for conditions other than malignant neoplasm: Secondary | ICD-10-CM | POA: Insufficient documentation

## 2019-03-04 DIAGNOSIS — Q330 Congenital cystic lung: Secondary | ICD-10-CM | POA: Diagnosis not present

## 2019-03-04 HISTORY — DX: Anemia, unspecified: D64.9

## 2019-03-04 HISTORY — DX: Rheumatoid arthritis, unspecified: M06.9

## 2019-03-04 HISTORY — PX: ESOPHAGOGASTRODUODENOSCOPY (EGD) WITH PROPOFOL: SHX5813

## 2019-03-04 HISTORY — DX: Cardiac murmur, unspecified: R01.1

## 2019-03-04 HISTORY — DX: Hyperlipidemia, unspecified: E78.5

## 2019-03-04 LAB — GLUCOSE, CAPILLARY: Glucose-Capillary: 90 mg/dL (ref 70–99)

## 2019-03-04 SURGERY — ESOPHAGOGASTRODUODENOSCOPY (EGD) WITH PROPOFOL
Anesthesia: General

## 2019-03-04 MED ORDER — PROPOFOL 10 MG/ML IV BOLUS
INTRAVENOUS | Status: DC | PRN
  Administered 2019-03-04: 70 mg via INTRAVENOUS

## 2019-03-04 MED ORDER — PROPOFOL 500 MG/50ML IV EMUL
INTRAVENOUS | Status: DC | PRN
  Administered 2019-03-04: 140 ug/kg/min via INTRAVENOUS

## 2019-03-04 MED ORDER — SODIUM CHLORIDE 0.9 % IV SOLN
INTRAVENOUS | Status: DC
  Administered 2019-03-04: 10:00:00 via INTRAVENOUS

## 2019-03-04 MED ORDER — PROPOFOL 500 MG/50ML IV EMUL
INTRAVENOUS | Status: AC
  Filled 2019-03-04: qty 50

## 2019-03-04 NOTE — Op Note (Signed)
[facility] Gastroenterology Patient Name: [name] Procedure Date: 03/04/2019 9:53 AM MRN: [id number] Account #: [id number] Date of Birth: 08-26-1940 Admit Type: Outpatient Age: 78 Room: [facility] ENDO ROOM 3 Gender: Female Note Status: Finalized Procedure:            Upper GI endoscopy Indications:          Surveillance procedure, follow up gastric nodule Providers:            Lollie Sails, MD Medicines:            Monitored Anesthesia Care Complications:        No immediate complications. Procedure:            Pre-Anesthesia Assessment:                       - ASA Grade Assessment: III - A patient with severe                        systemic disease.                       After obtaining informed consent, the endoscope was                        passed under direct vision. Throughout the procedure,                        the patient's blood pressure, pulse, and oxygen                        saturations were monitored continuously. The Endoscope                        was introduced through the mouth, and advanced to the                        third part of duodenum. The upper GI endoscopy was                        accomplished without difficulty. The patient tolerated                        the procedure well. Findings:      The Z-line was regular. Biopsies were taken with a cold forceps for       histology.      Submucosal nodule noted in the distal esophagus conwsistant with that       seen on previous EGD and EUS. No change.      A medium-sized hiatal hernia was present.      Three non-bleeding superficial gastric ulcers with pigmented material       were found in the gastric antrum and in the prepyloric region of the       stomach. The largest lesion was 5 mm in largest dimension. Biopsies were       taken with a cold forceps for histology from the antrum and body.       Biopsies were taken with a cold forceps for Helicobacter pylori testing   from the antrum and body.      The cardia and gastric fundus were normal on retroflexion otherwise.      The in the duodenum  was normal.      The exam of the esophagus was otherwise normal.      Probable gastric metaplasia in the anterior duodenal bulb. Impression:           - Z-line regular. Biopsied.                       - Medium-sized hiatal hernia.                       - Non-bleeding gastric ulcers with pigmented material.                        Biopsied.                       - Normal. Recommendation:       - Use Protonix (pantoprazole) 40 mg PO BID for 6 weeks.                       - Use Protonix (pantoprazole) 40 mg PO daily daily.                       - Repeat upper endoscopy in 2 months to check healing. Procedure Code(s):    --- Professional ---                       [id number], Esophagogastroduodenoscopy, flexible, transoral;                        with biopsy, single or multiple Diagnosis Code(s):    --- Professional ---                       K44.9, Diaphragmatic hernia without obstruction or                        gangrene                       K25.9, Gastric ulcer, unspecified as acute or chronic,                        without hemorrhage or perforation CPT copyright 2019 American Medical Association. All rights reserved. The codes documented in this report are preliminary and upon coder review may  be revised to meet current compliance requirements. Lollie Sails, MD 03/04/2019 10:30:43 AM This report has been signed electronically. Number of Addenda: 0 Note Initiated On: 03/04/2019 9:53 AM      [facility]

## 2019-03-04 NOTE — Anesthesia Preprocedure Evaluation (Signed)
Anesthesia Evaluation  Patient identified by MRN, date of birth, ID band Patient awake    Reviewed: Allergy & Precautions, H&P , NPO status , Patient's Chart, lab work & pertinent test results, reviewed documented beta blocker date and time   Airway Mallampati: II   Neck ROM: full    Dental  (+) Upper Dentures, Lower Dentures   Pulmonary neg pulmonary ROS,    Pulmonary exam normal        Cardiovascular Exercise Tolerance: Poor hypertension, On Medications Normal cardiovascular exam+ Valvular Problems/Murmurs  Rhythm:regular Rate:Normal     Neuro/Psych PSYCHIATRIC DISORDERS Anxiety negative neurological ROS     GI/Hepatic Neg liver ROS, GERD  Medicated,  Endo/Other  negative endocrine ROSdiabetes, Well Controlled, Type 2, Oral Hypoglycemic Agents  Renal/GU negative Renal ROS  negative genitourinary   Musculoskeletal   Abdominal   Peds  Hematology  (+) Blood dyscrasia, anemia ,   Anesthesia Other Findings Past Medical History: No date: Anemia No date: Arthritis No date: Diabetes mellitus without complication (HCC) No date: GERD (gastroesophageal reflux disease) No date: Heart murmur No date: Hypertension No date: Rheumatoid arthritis ([place]) Past Surgical History: No date: ABDOMINAL HYSTERECTOMY 10/25/2017: COLONOSCOPY WITH PROPOFOL; N/A     Comment:  Procedure: COLONOSCOPY WITH PROPOFOL;  Surgeon:               Lollie Sails, MD;  Location: [facility] ENDOSCOPY;                Service: Endoscopy;  Laterality: N/A; 10/25/2017: ESOPHAGOGASTRODUODENOSCOPY (EGD) WITH PROPOFOL; N/A     Comment:  Procedure: ESOPHAGOGASTRODUODENOSCOPY (EGD) WITH               PROPOFOL;  Surgeon: Lollie Sails, MD;  Location:               [facility] ENDOSCOPY;  Service: Endoscopy;  Laterality: N/A; 01/17/2018: EUS; N/A     Comment:  Procedure: ESOPHAGEAL ENDOSCOPIC ULTRASOUND (EUS)               RADIAL;  Surgeon: Reita Cliche, MD;   Location: [facility]               ENDOSCOPY;  Service: Gastroenterology;  Laterality: N/A; No date: right side had fatty tissue taken off No date: TONSILLECTOMY   Reproductive/Obstetrics negative OB ROS                             Anesthesia Physical Anesthesia Plan  ASA: III  Anesthesia Plan: General   Post-op Pain Management:    Induction:   PONV Risk Score and Plan:   Airway Management Planned:   Additional Equipment:   Intra-op Plan:   Post-operative Plan:   Informed Consent: I have reviewed the patients History and Physical, chart, labs and discussed the procedure including the risks, benefits and alternatives for the proposed anesthesia with the patient or authorized representative who has indicated his/her understanding and acceptance.     Dental Advisory Given  Plan Discussed with: CRNA  Anesthesia Plan Comments:         Anesthesia Quick Evaluation

## 2019-03-04 NOTE — Transfer of Care (Signed)
Immediate Anesthesia Transfer of Care Note  Patient: [name]  Procedure(s) Performed: ESOPHAGOGASTRODUODENOSCOPY (EGD) WITH PROPOFOL (N/A )  Patient Location: PACU  Anesthesia Type:General  Level of Consciousness: awake and drowsy  Airway & Oxygen Therapy: Patient Spontanous Breathing and Patient connected to nasal cannula oxygen  Post-op Assessment: Report given to RN and Post -op Vital signs reviewed and stable  Post vital signs: Reviewed and stable  Last Vitals:  Vitals Value Taken Time  BP 118/54 03/04/19 1027  Temp 36.4 C 03/04/19 1026  Pulse 77 03/04/19 1028  Resp 22 03/04/19 1028  SpO2 100 % 03/04/19 1028  Vitals shown include unvalidated device data.  Last Pain:  Vitals:   03/04/19 1026  TempSrc: Tympanic  PainSc: Asleep         Complications: No apparent anesthesia complications

## 2019-03-04 NOTE — H&P (Signed)
Outpatient short stay form Pre-procedure 03/04/2019 10:00 AM Lollie Sails MD  Primary Physician: Dr. Clayborn Bigness  Reason for visit: EGD  History of present illness: Patient is a 78 year old female presenting today for a EGD in regards to her history of a gastric nodule.  She had an EGD on 10/25/2017 showing a 18 mm submucosal nodule in the posterior wall of the gastric antrum.  This was very firm to biopsy.  The mucosal biopsies came back as foveolar hyperplasia.  There is also a polyps in the gastric body and a medium sized hiatal hernia.  Polyps were either fundic gland or inflamed oxyntic type mucosa.  She subsequently had an endoscopic ultrasound.  Biopsies consistent with foveolar hyperplasia in the antral lesion and a small bronchogenic cyst in the distal esophagus.  She is presenting today for follow-up on these lesions.  Patient denies any upper GI symptoms such as nausea vomiting abdominal pain heartburn reflux or dysphagia.  Her weight has changed some over the past year or 2 with her losing some weight over her appetite is stated is been good and weight stable for the.  Past 6 months.    Current Facility-Administered Medications:  .  0.9 %  sodium chloride infusion, , Intravenous, Continuous, Lollie Sails, MD, Last Rate: 20 mL/hr at 03/04/19 [id number]  Medications Prior to Admission  Medication Sig Dispense Refill Last Dose  . amLODipine (NORVASC) 5 MG tablet Take 2 tablets (10 mg total) by mouth every evening. 180 tablet 3 03/03/2019 at Unknown time  . aspirin EC 81 MG tablet Take 81 mg by mouth daily.   Past Week at Unknown time  . atorvastatin (LIPITOR) 10 MG tablet TAKE 1 TABLET AT BEDTIME   FOR HIGH CHOLESTEROL 90 tablet 3 03/03/2019 at Unknown time  . Ferrous Gluconate (IRON 27 PO) Take 27 mg by mouth.   Past Week at Unknown time  . folic acid (FOLVITE) 1 MG tablet Take 2 tablets (2 mg total) by mouth daily. 90 tablet 3 Past Week at Unknown time  . glimepiride (AMARYL) 4  MG tablet Take 1/2 tablet with breakfast and 1/2 tablet with dinner 90 tablet 3 03/03/2019 at Unknown time  . hydrALAZINE (APRESOLINE) 10 MG tablet Take 1 tablet (10 mg total) by mouth 3 (three) times daily. 90 tablet 3 03/03/2019 at Unknown time  . meloxicam (MOBIC) 7.5 MG tablet TAKE 1 TABLET EVERY MORNINGFOR ARTHRITIS (Patient taking differently: Take 7.5 mg by mouth as needed. ) 90 tablet 1 Past Month at Unknown time  . pantoprazole (PROTONIX) 40 MG tablet TAKE 1 TABLET DAILY FOR    REFLUX 90 tablet 2 03/03/2019 at Unknown time  . acetaminophen (TYLENOL) 500 MG tablet Take 500-1,000 mg by mouth every 6 (six) hours as needed.   03/01/2019  . Alcohol Swabs (ALCOHOL PREP) PADS alcohol prep pads     . glucose blood (ONETOUCH VERIO) test strip Use as directed  For twice a day diag e11.65 100 each 3   . lidocaine (LIDODERM) 5 % Place 1 patch onto the skin every 12 (twelve) hours. Remove & Discard patch within 12 hours or as directed by MD 10 patch 0   . methotrexate 2.5 MG tablet Take 15 mg by mouth daily.   03/01/2019  . Zoster Vaccine Adjuvanted [facility]) injection Shingles vaccine series - inject IM as directed . 0.5 mL 1      No Known Allergies   Past Medical History:  Diagnosis Date  . Anemia   .  Arthritis   . Diabetes mellitus without complication ([place])   . GERD (gastroesophageal reflux disease)   . Heart murmur   . Hyperlipidemia   . Hypertension   . Rheumatoid arthritis ([place])     Review of systems:      Physical Exam    Heart and lungs: Regular rate and rhythm without rub or gallop lungs are bilaterally clear    HEENT: Normocephalic atraumatic eyes are anicteric    Other:    Pertinant exam for procedure: Soft nontender nondistended bowel sounds positive normoactive    Planned proceedures: EGD and indicated procedures. I have discussed the risks benefits and complications of procedures to include not limited to bleeding, infection, perforation and the risk of sedation and  the patient wishes to proceed.    Lollie Sails, MD Gastroenterology 03/04/2019  10:00 AM

## 2019-03-04 NOTE — Anesthesia Post-op Follow-up Note (Signed)
Anesthesia QCDR form completed.        

## 2019-03-05 LAB — SURGICAL PATHOLOGY

## 2019-03-12 NOTE — Anesthesia Postprocedure Evaluation (Signed)
Anesthesia Post Note  Patient: [name]  Procedure(s) Performed: ESOPHAGOGASTRODUODENOSCOPY (EGD) WITH PROPOFOL (N/A )  Patient location during evaluation: PACU Anesthesia Type: General Level of consciousness: awake and alert Pain management: pain level controlled Vital Signs Assessment: post-procedure vital signs reviewed and stable Respiratory status: spontaneous breathing, nonlabored ventilation, respiratory function stable and patient connected to nasal cannula oxygen Cardiovascular status: blood pressure returned to baseline and stable Postop Assessment: no apparent nausea or vomiting Anesthetic complications: no     Last Vitals:  Vitals:   03/04/19 1026 03/04/19 1056  BP: (!) 118/54 (!) 170/73  Pulse: 77   Resp: 12   Temp: (!) 36.4 C   SpO2: 100%     Last Pain:  Vitals:   03/05/19 0811  TempSrc:   PainSc: 0-No pain                 Molli Barrows

## 2019-03-18 DIAGNOSIS — Z79899 Other long term (current) drug therapy: Secondary | ICD-10-CM | POA: Diagnosis not present

## 2019-03-18 DIAGNOSIS — M0579 Rheumatoid arthritis with rheumatoid factor of multiple sites without organ or systems involvement: Secondary | ICD-10-CM | POA: Diagnosis not present

## 2019-03-25 DIAGNOSIS — G8929 Other chronic pain: Secondary | ICD-10-CM | POA: Diagnosis not present

## 2019-03-25 DIAGNOSIS — M0579 Rheumatoid arthritis with rheumatoid factor of multiple sites without organ or systems involvement: Secondary | ICD-10-CM | POA: Diagnosis not present

## 2019-03-25 DIAGNOSIS — Z79899 Other long term (current) drug therapy: Secondary | ICD-10-CM | POA: Diagnosis not present

## 2019-03-25 DIAGNOSIS — E119 Type 2 diabetes mellitus without complications: Secondary | ICD-10-CM | POA: Diagnosis not present

## 2019-03-25 DIAGNOSIS — M545 Low back pain: Secondary | ICD-10-CM | POA: Diagnosis not present

## 2019-03-27 DIAGNOSIS — R109 Unspecified abdominal pain: Secondary | ICD-10-CM | POA: Diagnosis not present

## 2019-03-27 DIAGNOSIS — K269 Duodenal ulcer, unspecified as acute or chronic, without hemorrhage or perforation: Secondary | ICD-10-CM | POA: Diagnosis not present

## 2019-03-27 DIAGNOSIS — K2970 Gastritis, unspecified, without bleeding: Secondary | ICD-10-CM | POA: Diagnosis not present

## 2019-03-27 DIAGNOSIS — R1013 Epigastric pain: Secondary | ICD-10-CM | POA: Diagnosis not present

## 2019-04-30 ENCOUNTER — Other Ambulatory Visit: Payer: Self-pay

## 2019-04-30 DIAGNOSIS — I10 Essential (primary) hypertension: Secondary | ICD-10-CM

## 2019-04-30 MED ORDER — HYDRALAZINE HCL 10 MG PO TABS: 10.0000 mg | ORAL_TABLET | Freq: Three times a day (TID) | ORAL | 3 refills | Status: DC

## 2019-04-30 MED ORDER — AMLODIPINE BESYLATE 5 MG PO TABS: 10.0000 mg | ORAL_TABLET | Freq: Every evening | ORAL | 0 refills | Status: DC

## 2019-04-30 MED ORDER — ATORVASTATIN CALCIUM 10 MG PO TABS: ORAL_TABLET | ORAL | 3 refills | Status: DC

## 2019-05-02 ENCOUNTER — Other Ambulatory Visit: Payer: Self-pay

## 2019-05-02 DIAGNOSIS — I10 Essential (primary) hypertension: Secondary | ICD-10-CM

## 2019-05-02 MED ORDER — ATORVASTATIN CALCIUM 10 MG PO TABS: ORAL_TABLET | ORAL | 3 refills | Status: DC

## 2019-05-02 MED ORDER — AMLODIPINE BESYLATE 5 MG PO TABS: 10.0000 mg | ORAL_TABLET | Freq: Every evening | ORAL | 0 refills | Status: DC

## 2019-05-02 MED ORDER — ONETOUCH VERIO VI STRP: ORAL_STRIP | 3 refills | Status: DC

## 2019-05-04 DIAGNOSIS — R69 Illness, unspecified: Secondary | ICD-10-CM | POA: Diagnosis not present

## 2019-05-08 ENCOUNTER — Ambulatory Visit (INDEPENDENT_AMBULATORY_CARE_PROVIDER_SITE_OTHER): Payer: Medicare HMO | Admitting: Internal Medicine

## 2019-05-08 ENCOUNTER — Other Ambulatory Visit: Payer: Self-pay

## 2019-05-08 VITALS — BP 152/70 | HR 53 | Ht 62.0 in | Wt 137.0 lb

## 2019-05-08 DIAGNOSIS — I38 Endocarditis, valve unspecified: Secondary | ICD-10-CM | POA: Diagnosis not present

## 2019-05-08 DIAGNOSIS — I10 Essential (primary) hypertension: Secondary | ICD-10-CM

## 2019-05-08 DIAGNOSIS — R079 Chest pain, unspecified: Secondary | ICD-10-CM

## 2019-05-08 MED ORDER — HYDROCHLOROTHIAZIDE 25 MG PO TABS: 25.0000 mg | ORAL_TABLET | Freq: Every day | ORAL | 3 refills | Status: DC

## 2019-05-08 NOTE — Patient Instructions (Signed)
Medication Instructions:  Your physician has recommended you make the following change in your medication:  1- STOP Hydralazine. 2- START Hydrochlorothiazide 25 mg ( 1 tablet) by mouth once a day.  *If you need a refill on your cardiac medications before your next appointment, please call your pharmacy*  Lab Work: Your physician recommends that you return for lab work in: [place].   If you have labs (blood work) drawn today and your tests are completely normal, you will receive your results only by: Marland Kitchen MyChart Message (if you have MyChart) OR . A paper copy in the mail If you have any lab test that is abnormal or we need to change your treatment, we will call you to review the results.  Testing/Procedures: none  Follow-Up: At [facility], you and your health needs are our priority.  As part of our continuing mission to provide you with exceptional heart care, we have created designated Provider Care Teams.  These Care Teams include your primary Cardiologist (physician) and Advanced Practice Providers (APPs -  Physician Assistants and Nurse Practitioners) who all work together to provide you with the care you need, when you need it.  Your next appointment:   1 month(s) with an APP (BMET at that time)  The format for your next appointment:   In Person  Provider:    You may see one of the following Advanced Practice Providers on your designated Care Team:    Murray Hodgkins, NP  [name], PA-C  Marrianne Mood, PA-C

## 2019-05-08 NOTE — Progress Notes (Signed)
Follow-up Outpatient Visit Date: 05/08/2019  Primary Care Provider: Lavera Guise, MD [address] [place] [address]  Chief Complaint: Follow-up valvular heart disease  HPI:  [name] is a 78 y.o. female with history of valvular heart disease (mild to moderate MR, AI, and TR as well as mild AS), who presents for follow-up of valvular heart disease.  I met her in late September, at which time [name] reported mild exertional dyspnea when walking uphill, which had been stable.  Preceding echo had been ordered due to incidental finding of a heart murmur on exam.  [name] also reported a 38 pound unintentional weight loss over the last year.  We did not make any medication changes or pursue additional testing at that time.  Today, [name] reports feeling relatively well.  Her chronic exertional dyspnea is unchanged from baseline.  She has noticed a few sporadic "twitches" in the left side of her chest.  This has happened twice over the last 2 weeks and is not associated with any other symptoms.  She has chronic swelling of the right knee that she attributes to arthritis.  She has not had any ankle or calf edema.  She also denies orthopnea, PND, and lightheadedness.  She notes that her blood pressure has been running a bit high lately.  --------------------------------------------------------------------------------------------------  Cardiovascular History & Procedures: Cardiovascular Problems:  Mixed valvular heart disease  Risk Factors:  Hypertension, diabetes mellitus, and age > 19  Cath/PCI:  None  CV Surgery:  None  EP Procedures and Devices:  None  Non-Invasive Evaluation(s):  TTE (01/24/2019): Small left ventricle with moderate LVH.  Hyperdynamic contraction (LVEF greater than 70%).  Mild aortic valve calcification with mild stenosis and moderate regurgitation.  Moderate mitral annular calcification with mild regurgitation.  Mild tricuspid  regurgitation.  Mild pulmonary hypertension.  Recent CV Pertinent Labs: Lab Results  Component Value Date   CHOL 228 (H) 07/25/2018   HDL 92 07/25/2018   LDLCALC 120 (H) 07/25/2018   TRIG 81 07/25/2018   K 3.9 10/16/2017   BUN 12 10/16/2017   CREATININE 0.93 10/16/2017    Past medical and surgical history were reviewed and updated in EPIC.  Current Meds  Medication Sig  . acetaminophen (TYLENOL) 500 MG tablet Take 500-1,000 mg by mouth every 6 (six) hours as needed.  . Alcohol Swabs (ALCOHOL PREP) PADS alcohol prep pads  . amLODipine (NORVASC) 5 MG tablet Take 2 tablets (10 mg total) by mouth every evening.  Marland Kitchen aspirin EC 81 MG tablet Take 81 mg by mouth daily.  Marland Kitchen atorvastatin (LIPITOR) 10 MG tablet TAKE 1 TABLET AT BEDTIME   FOR HIGH CHOLESTEROL  . Ferrous Gluconate (IRON 27 PO) Take 27 mg by mouth.  . folic acid (FOLVITE) 1 MG tablet Take 2 tablets (2 mg total) by mouth daily.  Marland Kitchen glimepiride (AMARYL) 4 MG tablet Take 1/2 tablet with breakfast and 1/2 tablet with dinner  . glucose blood (ONETOUCH VERIO) test strip Use as directed  For twice a day diag e11.65  . hydrALAZINE (APRESOLINE) 10 MG tablet Take 1 tablet (10 mg total) by mouth 3 (three) times daily.  Marland Kitchen lidocaine (LIDODERM) 5 % Place 1 patch onto the skin every 12 (twelve) hours. Remove & Discard patch within 12 hours or as directed by MD  . meloxicam (MOBIC) 7.5 MG tablet TAKE 1 TABLET EVERY MORNINGFOR ARTHRITIS (Patient taking differently: Take 7.5 mg by mouth as needed. )  . methotrexate 2.5 MG tablet  Take 15 mg by mouth daily.  . pantoprazole (PROTONIX) 40 MG tablet TAKE 1 TABLET DAILY FOR    REFLUX (Patient taking differently: as needed. )  . Zoster Vaccine Adjuvanted [facility]) injection Shingles vaccine series - inject IM as directed .    Allergies: Patient has no known allergies.  Social History   Tobacco Use  . Smoking status: Never Smoker  . Smokeless tobacco: Never Used  Substance Use Topics  . Alcohol  use: No  . Drug use: No    Family History  Problem Relation Age of Onset  . Hypertension Mother   . Diabetes Father   . Heart disease Brother 3       Open heart surgery (details unknown)  . Breast cancer Neg Hx     Review of Systems: A 12-system review of systems was performed and was negative except as noted in the HPI.  --------------------------------------------------------------------------------------------------  Physical Exam: BP (!) 152/70 (BP Location: Left Arm, Patient Position: Sitting, Cuff Size: Normal)   Pulse (!) 53   Ht 5' 2" (1.575 m)   Wt 137 lb (62.1 kg)   SpO2 99%   BMI 25.06 kg/m   General: NAD. HEENT: No conjunctival pallor or scleral icterus. Facemask in place. Neck: Supple without lymphadenopathy, thyromegaly, JVD, or HJR. Lungs: Normal work of breathing. Clear to auscultation bilaterally without wheezes or crackles. Heart: Bradycardic but regular with 2/6 systolic murmur.  No rubs or gallops. Abd: Bowel sounds present. Soft, NT/ND without hepatosplenomegaly Ext: No lower extremity edema. Radial, PT, and DP pulses are 2+ bilaterally. Skin: Warm and dry without rash.  EKG: Sinus bradycardia (heart rate 53 bpm) with borderline LVH and lateral T wave changes that may reflect abnormal repolarization or less likely ischemia.  Lab Results  Component Value Date   WBC 4.4 03/31/2017   HGB 11.8 (L) 03/31/2017   HCT 36.7 03/31/2017   MCV 85.4 03/31/2017   PLT 165 03/31/2017    Lab Results  Component Value Date   NA 143 10/16/2017   K 3.9 10/16/2017   CL 100 10/16/2017   CO2 26 10/16/2017   BUN 12 10/16/2017   CREATININE 0.93 10/16/2017   GLUCOSE 110 (H) 10/16/2017   ALT 18 03/31/2017    Lab Results  Component Value Date   CHOL 228 (H) 07/25/2018   HDL 92 07/25/2018   LDLCALC 120 (H) 07/25/2018   TRIG 81 07/25/2018    --------------------------------------------------------------------------------------------------  ASSESSMENT AND  PLAN: Mixed valvular heart disease: Stable DOE reported today.  [name] appears euvolemic on exam.  I stressed the importance of BP control to mitigate effects of her valvular disease and hopefully prevent/slow progression.  We will therefore adjust her BP medications, as below.  Hypertension: BP remains elevated today.  In order to simplify her regimen, we will discontinue hydralazine and add HCTZ 25 mg daily.  She should continue amlodipine 10 mg daily.  I will check a BMP today; it should be rechecked when she returns for follow-up in ~1 month.  Chest pain: Atypical and not consistent with angina.  Defer ischemia evaluation at this time.  Follow-up: Return to clinic in 1 month for BMP and blood pressure evaluation.  Nelva Bush, MD 05/08/2019 10:34 AM

## 2019-05-09 ENCOUNTER — Encounter: Payer: Self-pay | Admitting: Internal Medicine

## 2019-05-09 DIAGNOSIS — R079 Chest pain, unspecified: Secondary | ICD-10-CM | POA: Insufficient documentation

## 2019-05-09 LAB — BASIC METABOLIC PANEL
BUN/Creatinine Ratio: 16 (ref 12–28)
BUN: 15 mg/dL (ref 8–27)
CO2: 27 mmol/L (ref 20–29)
Calcium: 9.7 mg/dL (ref 8.7–10.3)
Chloride: 103 mmol/L (ref 96–106)
Creatinine, Ser: 0.93 mg/dL (ref 0.57–1.00)
GFR calc Af Amer: 67 mL/min/{1.73_m2} (ref >59)
GFR calc non Af Amer: 58 mL/min/{1.73_m2} — ABNORMAL LOW (ref >59)
Glucose: 139 mg/dL — ABNORMAL HIGH (ref 65–99)
Potassium: 4.2 mmol/L (ref 3.5–5.2)
Sodium: 142 mmol/L (ref 134–144)

## 2019-05-14 ENCOUNTER — Telehealth: Payer: Self-pay

## 2019-05-14 NOTE — Telephone Encounter (Signed)
CONFIRMED 05-16-19 OV AS VIRTUAL.

## 2019-05-16 ENCOUNTER — Encounter: Payer: Self-pay | Admitting: Nurse Practitioner

## 2019-05-16 ENCOUNTER — Ambulatory Visit (INDEPENDENT_AMBULATORY_CARE_PROVIDER_SITE_OTHER): Payer: Medicare HMO | Admitting: Nurse Practitioner

## 2019-05-16 ENCOUNTER — Other Ambulatory Visit: Payer: Self-pay

## 2019-05-16 VITALS — BP 130/67 | Ht 62.0 in | Wt 140.0 lb

## 2019-05-16 DIAGNOSIS — E1165 Type 2 diabetes mellitus with hyperglycemia: Secondary | ICD-10-CM

## 2019-05-16 DIAGNOSIS — I059 Rheumatic mitral valve disease, unspecified: Secondary | ICD-10-CM | POA: Diagnosis not present

## 2019-05-16 DIAGNOSIS — I10 Essential (primary) hypertension: Secondary | ICD-10-CM

## 2019-05-16 DIAGNOSIS — M069 Rheumatoid arthritis, unspecified: Secondary | ICD-10-CM | POA: Diagnosis not present

## 2019-05-16 NOTE — Progress Notes (Signed)
[facility] [place], [place] [address]  Internal MEDICINE  Telephone Visit  Patient Name: [name]  [id number]  [id number]  Date of Service: 05/16/2019  I connected with the patient at 8:40am by elephone and verified the patients identity using two identifiers.   I discussed the limitations, risks, security and privacy concerns of performing an evaluation and management service by telephone and the availability of in person appointments. I also discussed with the patient that there may be a patient responsible charge related to the service.  The patient expressed understanding and agrees to proceed.    Chief Complaint  Patient presents with  . Telephone Assessment  . Telephone Screen  . Diabetes  . Hypertension  . Hyperlipidemia    The patient has been contacted via telephone for follow up visit due to concerns for spread of novel coronavirus. The patient presents for follow up visit. She states that she has no new new concerns of complaints today. Her blood sugars are doing well. Blood sugar this morning is 154. Blood pressure is well managed. She did see Dr. Saunders Revel, cardiology, who let her know all of her tests were stable. She did have to take prednisone for flare of rheumatoid arthritis.      Current Medication: Outpatient Encounter Medications as of 05/16/2019  Medication Sig Note  . acetaminophen (TYLENOL) 500 MG tablet Take 500-1,000 mg by mouth every 6 (six) hours as needed.   . Alcohol Swabs (ALCOHOL PREP) PADS alcohol prep pads   . amLODipine (NORVASC) 5 MG tablet Take 2 tablets (10 mg total) by mouth every evening.   Marland Kitchen aspirin EC 81 MG tablet Take 81 mg by mouth daily.   Marland Kitchen atorvastatin (LIPITOR) 10 MG tablet TAKE 1 TABLET AT BEDTIME   FOR HIGH CHOLESTEROL   . Ferrous Gluconate (IRON 27 PO) Take 27 mg by mouth.   . folic acid (FOLVITE) 1 MG tablet Take 2 tablets (2 mg total) by mouth daily.   Marland Kitchen glimepiride (AMARYL) 4 MG tablet Take 1/2  tablet with breakfast and 1/2 tablet with dinner   . glucose blood (ONETOUCH VERIO) test strip Use as directed  For twice a day diag e11.65   . hydrochlorothiazide (HYDRODIURIL) 25 MG tablet Take 1 tablet (25 mg total) by mouth daily.   Marland Kitchen lidocaine (LIDODERM) 5 % Place 1 patch onto the skin every 12 (twelve) hours. Remove & Discard patch within 12 hours or as directed by MD   . meloxicam (MOBIC) 7.5 MG tablet TAKE 1 TABLET EVERY MORNINGFOR ARTHRITIS (Patient taking differently: Take 7.5 mg by mouth as needed. )   . methotrexate 2.5 MG tablet Take 15 mg by mouth daily. 03/31/2017: Patient takes 6 tablets every Saturday  . pantoprazole (PROTONIX) 40 MG tablet TAKE 1 TABLET DAILY FOR    REFLUX (Patient taking differently: as needed. )   . Zoster Vaccine Adjuvanted [facility]) injection Shingles vaccine series - inject IM as directed .    No facility-administered encounter medications on file as of 05/16/2019.    Surgical History: Past Surgical History:  Procedure Laterality Date  . ABDOMINAL HYSTERECTOMY    . COLONOSCOPY WITH PROPOFOL N/A 10/25/2017   Procedure: COLONOSCOPY WITH PROPOFOL;  Surgeon: Lollie Sails, MD;  Location: [facility] ENDOSCOPY;  Service: Endoscopy;  Laterality: N/A;  . ESOPHAGOGASTRODUODENOSCOPY (EGD) WITH PROPOFOL N/A 10/25/2017   Procedure: ESOPHAGOGASTRODUODENOSCOPY (EGD) WITH PROPOFOL;  Surgeon: Lollie Sails, MD;  Location: [facility] ENDOSCOPY;  Service: Endoscopy;  Laterality: N/A;  .  ESOPHAGOGASTRODUODENOSCOPY (EGD) WITH PROPOFOL N/A 03/04/2019   Procedure: ESOPHAGOGASTRODUODENOSCOPY (EGD) WITH PROPOFOL;  Surgeon: Lollie Sails, MD;  Location: [facility] ENDOSCOPY;  Service: Endoscopy;  Laterality: N/A;  . EUS N/A 01/17/2018   Procedure: ESOPHAGEAL ENDOSCOPIC ULTRASOUND (EUS) RADIAL;  Surgeon: Reita Cliche, MD;  Location: [facility] ENDOSCOPY;  Service: Gastroenterology;  Laterality: N/A;  . right side had fatty tissue taken off    . TONSILLECTOMY      Medical  History: Past Medical History:  Diagnosis Date  . Anemia   . Arthritis   . Diabetes mellitus without complication ([place])   . GERD (gastroesophageal reflux disease)   . Heart murmur   . Hyperlipidemia   . Hypertension   . Rheumatoid arthritis ([place])     Family History: Family History  Problem Relation Age of Onset  . Hypertension Mother   . Diabetes Father   . Heart disease Brother 66       Open heart surgery (details unknown)  . Breast cancer Neg Hx     Social History   Socioeconomic History  . Marital status: Widowed    Spouse name: Not on file  . Number of children: Not on file  . Years of education: Not on file  . Highest education level: Not on file  Occupational History  . Not on file  Tobacco Use  . Smoking status: Never Smoker  . Smokeless tobacco: Never Used  Substance and Sexual Activity  . Alcohol use: No  . Drug use: No  . Sexual activity: Not on file  Other Topics Concern  . Not on file  Social History Narrative  . Not on file   Social Determinants of Health   Financial Resource Strain:   . Difficulty of Paying Living Expenses: Not on file  Food Insecurity:   . Worried About Charity fundraiser in the Last Year: Not on file  . Ran Out of Food in the Last Year: Not on file  Transportation Needs:   . Lack of Transportation (Medical): Not on file  . Lack of Transportation (Non-Medical): Not on file  Physical Activity:   . Days of Exercise per Week: Not on file  . Minutes of Exercise per Session: Not on file  Stress:   . Feeling of Stress : Not on file  Social Connections:   . Frequency of Communication with Friends and Family: Not on file  . Frequency of Social Gatherings with Friends and Family: Not on file  . Attends Religious Services: Not on file  . Active Member of Clubs or Organizations: Not on file  . Attends Archivist Meetings: Not on file  . Marital Status: Not on file  Intimate Partner Violence:   . Fear of Current or  Ex-Partner: Not on file  . Emotionally Abused: Not on file  . Physically Abused: Not on file  . Sexually Abused: Not on file      Review of Systems  Constitutional: Negative for activity change, chills, fatigue and unexpected weight change.  HENT: Negative for congestion, postnasal drip, rhinorrhea, sneezing and sore throat.   Respiratory: Negative for cough, chest tightness, shortness of breath and wheezing.   Cardiovascular: Negative for chest pain and palpitations.       Mildly elevated blood pressure   Gastrointestinal: Negative for abdominal pain, constipation, diarrhea, nausea and vomiting.  Endocrine: Negative for cold intolerance, heat intolerance, polydipsia and polyuria.       Blood sugars doing well   Musculoskeletal: Positive for  arthralgias, joint swelling and myalgias. Negative for back pain and neck pain.       Did have flare of rheumatoid arthritis since thanksgiving. Took prednisone for a few days and she is improving.   Skin: Negative for rash.  Allergic/Immunologic: Negative for environmental allergies, food allergies and immunocompromised state.  Neurological: Negative for dizziness, tremors, numbness and headaches.  Hematological: Negative for adenopathy. Does not bruise/bleed easily.  Psychiatric/Behavioral: Negative for behavioral problems (Depression), sleep disturbance and suicidal ideas. The patient is nervous/anxious.     Today's Vitals   05/16/19 0849  BP: 130/67  Weight: 140 lb (63.5 kg)  Height: 5\' 2"  (1.575 m)   Body mass index is 25.61 kg/m.  Observation/Objective:   The patient is alert and oriented. She is pleasant and answers all questions appropriately. Breathing is non-labored. She is in no acute distress at this time.    Assessment/Plan: 1. Type 2 diabetes mellitus with hyperglycemia, without long-term current use of insulin (HCC) bloodsugars stable. Today, non-fasting glucose is 15. Continue diabetic medication as prescribed and check  HgbA1c at her next, inoffice visit.   2. Essential hypertension Stable. Continue bp medication as prescribed   3. Mitral valvular disorder Has seen cardiology. Follow up as scheduled   4. Rheumatoid arthritis involving multiple sites, unspecified whether rheumatoid factor present [facility]) Regular visits with rheumatology as scheduled.   General Counseling: Evelette verbalizes understanding of the findings of today's phone visit and agrees with plan of treatment. I have discussed any further diagnostic evaluation that may be needed or ordered today. We also reviewed her medications today. she has been encouraged to call the office with any questions or concerns that should arise related to todays visit.  This patient was seen by Leretha Pol FNP Collaboration with Dr Lavera Guise as a part of collaborative care agreement   Time spent: 25 Minutes    Dr Lavera Guise Internal medicine

## 2019-06-06 NOTE — Progress Notes (Deleted)
Cardiology Office Note    Date:  06/06/2019   ID:  [name], DOB 07-01-1940, MRN [id number]  PCP:  Lavera Guise, MD  Cardiologist:  Nelva Bush, MD  Electrophysiologist:  None   Chief Complaint: Follow-up  History of Present Illness:   [name] is a 79 y.o. female with history of valvular heart disease as detailed below, DM2, HTN, HLD, RA, and GERD who presents for follow-up of HTN.  Echo from 12/2018 demonstrated a hyperdynamic LV systolic function with an EF greater than 70%, mild mitral regurgitation, mild aortic stenosis with moderate regurgitation, mild tricuspid regurgitation, and mild pulmonary pretension.  She was initially evaluated in 01/2019 for the above and noted mild exertional dyspnea when walking uphill which has been stable.  She reported a 38 pound unintentional weight loss over the prior year.  She was most recently seen in follow-up on 05/08/2019 and was feeling relatively well with unchanged chronic exertional dyspnea.  She did note a few sporadic "twitches" on the left side of her chest that were not associated with any other symptoms.  She noted chronic swelling of the right knee that has been attributed to arthritis.  She indicated her blood pressure has been running a little high with reading of 152/70 at her office visit.  With this, hydralazine was discontinued and patient was started on HCTZ 25 mg daily.  She was continued on amlodipine 10 mg daily.  BMP was checked with stable renal function and potassium as outlined below.  ***   Labs independently reviewed: 04/2019 - BUN 15, creatinine 0.93, potassium 4.2 02/2019 - A1c 6.0 06/2018 - TSH normal, TC 228, TG 81, HDL 92, LDL 120  Past Medical History:  Diagnosis Date  . Anemia   . Arthritis   . Diabetes mellitus without complication ([place])   . GERD (gastroesophageal reflux disease)   . Heart murmur   . Hyperlipidemia   . Hypertension   . Rheumatoid arthritis [facility])     Past Surgical  History:  Procedure Laterality Date  . ABDOMINAL HYSTERECTOMY    . COLONOSCOPY WITH PROPOFOL N/A 10/25/2017   Procedure: COLONOSCOPY WITH PROPOFOL;  Surgeon: Lollie Sails, MD;  [facility] ENDOSCOPY;  Service: Endoscopy;  Laterality: N/A;  . ESOPHAGOGASTRODUODENOSCOPY (EGD) WITH PROPOFOL N/A 10/25/2017   Procedure: ESOPHAGOGASTRODUODENOSCOPY (EGD) WITH PROPOFOL;  Surgeon: Lollie Sails, MD;  Location: [facility] ENDOSCOPY;  Service: Endoscopy;  Laterality: N/A;  . ESOPHAGOGASTRODUODENOSCOPY (EGD) WITH PROPOFOL N/A 03/04/2019   Procedure: ESOPHAGOGASTRODUODENOSCOPY (EGD) WITH PROPOFOL;  Surgeon: Lollie Sails, MD;  Location: [facility] ENDOSCOPY;  Service: Endoscopy;  Laterality: N/A;  . EUS N/A 01/17/2018   Procedure: ESOPHAGEAL ENDOSCOPIC ULTRASOUND (EUS) RADIAL;  Surgeon: Reita Cliche, MD;  Location: [facility] ENDOSCOPY;  Service: Gastroenterology;  Laterality: N/A;  . right side had fatty tissue taken off    . TONSILLECTOMY      Current Medications: No outpatient medications have been marked as taking for the 06/09/19 encounter (Appointment) with Rise Mu, PA-C.    Allergies:   Patient has no known allergies.   Social History   Socioeconomic History  . Marital status: Widowed    Spouse name: Not on file  . Number of children: Not on file  . Years of education: Not on file  . Highest education level: Not on file  Occupational History  . Not on file  Tobacco Use  . Smoking status: Never Smoker  . Smokeless tobacco: Never Used  Substance and Sexual  Activity  . Alcohol use: No  . Drug use: No  . Sexual activity: Not on file  Other Topics Concern  . Not on file  Social History Narrative  . Not on file   Social Determinants of Health   Financial Resource Strain:   . Difficulty of Paying Living Expenses: Not on file  Food Insecurity:   . Worried About Charity fundraiser in the Last Year: Not on file  . Ran Out of Food in the Last Year: Not on file  Transportation  Needs:   . Lack of Transportation (Medical): Not on file  . Lack of Transportation (Non-Medical): Not on file  Physical Activity:   . Days of Exercise per Week: Not on file  . Minutes of Exercise per Session: Not on file  Stress:   . Feeling of Stress : Not on file  Social Connections:   . Frequency of Communication with Friends and Family: Not on file  . Frequency of Social Gatherings with Friends and Family: Not on file  . Attends Religious Services: Not on file  . Active Member of Clubs or Organizations: Not on file  . Attends Archivist Meetings: Not on file  . Marital Status: Not on file     Family History:  The patient's family history includes Diabetes in her father; Heart disease (age of onset: 87) in her brother; Hypertension in her mother. There is no history of Breast cancer.  ROS:   ROS   EKGs/Labs/Other Studies Reviewed:    Studies reviewed were summarized above. The additional studies were reviewed today:  2D echo 12/2018: LV hyperdynamic with EF greater than 70%, small LV with moderate LVH, mild aortic valve calcification with mild stenosis and moderate regurgitation, moderate mitral annular calcification with mild regurgitation, mild tricuspid regurgitation, mild pulmonary hypertension.   EKG:  EKG is ordered today.  The EKG ordered today demonstrates ***  Recent Labs: 07/25/2018: TSH 1.580 05/08/2019: BUN 15; Creatinine, Ser 0.93; Potassium 4.2; Sodium 142  Recent Lipid Panel    Component Value Date/Time   CHOL 228 (H) 07/25/2018 1624   TRIG 81 07/25/2018 1624   HDL 92 07/25/2018 1624   LDLCALC 120 (H) 07/25/2018 1624    PHYSICAL EXAM:    VS:  There were no vitals taken for this visit.  BMI: There is no height or weight on file to calculate BMI.  Physical Exam  Wt Readings from Last 3 Encounters:  05/16/19 140 lb (63.5 kg)  05/08/19 137 lb (62.1 kg)  03/04/19 137 lb (62.1 kg)     ASSESSMENT & PLAN:   1. ***  Disposition: F/u with  Dr. Saunders Revel or an APP in ***.   Medication Adjustments/Labs and Tests Ordered: Current medicines are reviewed at length with the patient today.  Concerns regarding medicines are outlined above. Medication changes, Labs and Tests ordered today are summarized above and listed in the Patient Instructions accessible in Encounters.   Signed, [name], PA-C 06/06/2019 10:31 AM     [facility] [address] [place] [place], [place] [address] [phone]

## 2019-06-09 ENCOUNTER — Ambulatory Visit: Payer: Medicare HMO | Admitting: Physician Assistant

## 2019-06-11 ENCOUNTER — Ambulatory Visit (INDEPENDENT_AMBULATORY_CARE_PROVIDER_SITE_OTHER): Payer: Medicare HMO | Admitting: Family

## 2019-06-11 ENCOUNTER — Encounter: Payer: Self-pay | Admitting: Family

## 2019-06-11 ENCOUNTER — Other Ambulatory Visit: Payer: Self-pay

## 2019-06-11 VITALS — BP 148/78 | HR 80 | Resp 16 | Ht 62.0 in | Wt 138.5 lb

## 2019-06-11 DIAGNOSIS — I10 Essential (primary) hypertension: Secondary | ICD-10-CM | POA: Diagnosis not present

## 2019-06-11 DIAGNOSIS — R0609 Other forms of dyspnea: Secondary | ICD-10-CM

## 2019-06-11 DIAGNOSIS — R0600 Dyspnea, unspecified: Secondary | ICD-10-CM

## 2019-06-11 DIAGNOSIS — I38 Endocarditis, valve unspecified: Secondary | ICD-10-CM | POA: Diagnosis not present

## 2019-06-11 MED ORDER — LOSARTAN POTASSIUM 25 MG PO TABS: 25.0000 mg | ORAL_TABLET | Freq: Every day | ORAL | 3 refills | Status: DC

## 2019-06-11 NOTE — Patient Instructions (Addendum)
Medication Instructions:  Your physician has recommended you make the following change in your medication:   START Losartan 25mg  (one tablet) daily  CONTINUE HCTZ 25mg  daily  *If you need a refill on your cardiac medications before your next appointment, please call your pharmacy*  Lab Work: Your physician recommends that you return for lab work today: BMET  If you have labs (blood work) drawn today and your tests are completely normal, you will receive your results only by: Marland Kitchen MyChart Message (if you have MyChart) OR . A paper copy in the mail If you have any lab test that is abnormal or we need to change your treatment, we will call you to review the results.  Testing/Procedures: You had an EKG today. It looked good.  Follow-Up: At [facility], you and your health needs are our priority.  As part of our continuing mission to provide you with exceptional heart care, we have created designated Provider Care Teams.  These Care Teams include your primary Cardiologist (physician) and Advanced Practice Providers (APPs -  Physician Assistants and Nurse Practitioners) who all work together to provide you with the care you need, when you need it.  Your next appointment:   3 month(s)  The format for your next appointment:   In Person  Provider:    You may see Nelva Bush, MD or one of the following Advanced Practice Providers on your designated Care Team:    Murray Hodgkins, NP  [name], PA-C  Marrianne Mood, PA-C  Other Instructions  Tips to Measure your Blood Pressure Correctly  To determine whether you have hypertension, a medical professional will take a blood pressure reading. How you prepare for the test, the position of your arm, and other factors can change a blood pressure reading by 10% or more. That could be enough to hide high blood pressure, start you on a drug you don't really need, or lead your doctor to incorrectly adjust your medications.  National  and international guidelines offer specific instructions for measuring blood pressure. If a doctor, nurse, or medical assistant isn't doing it right, don't hesitate to ask him or her to get with the guidelines.  Here's what you can do to ensure a correct reading: . Don't drink a caffeinated beverage or smoke during the 30 minutes before the test. . Sit quietly for five minutes before the test begins. . During the measurement, sit in a chair with your feet on the floor and your arm supported so your elbow is at about heart level. . The inflatable part of the cuff should completely cover at least 80% of your upper arm, and the cuff should be placed on bare skin, not over a shirt. . Don't talk during the measurement. . Have your blood pressure measured twice, with a brief break in between. If the readings are different by 5 points or more, have it done a third time.  There are times to break these rules. If you sometimes feel lightheaded when getting out of bed in the morning or when you stand after sitting, you should have your blood pressure checked while seated and then while standing to see if it falls from one position to the next.  Because blood pressure varies throughout the day, your doctor will rarely diagnose hypertension on the basis of a single reading. Instead, he or she will want to confirm the measurements on at least two occasions, usually within a few weeks of one another. The exception to this rule  is if you have a blood pressure reading of 180/110 mm Hg or higher. A result this high usually calls for prompt treatment.  It's a good idea to have your blood pressure measured in both arms at least once, since the reading in one arm (usually the right) may be higher than that in the left. A 2014 study in The American Journal of Medicine of nearly 3,400 people found average arm- to-arm differences in systolic blood pressure of about 5 points. The higher number should be used to make treatment  decisions.  In 2017, new guidelines from the [place], the [employer] of Cardiology, and nine other health organizations lowered the diagnosis of high blood pressure to 130/80 mm Hg or higher for all adults. The guidelines also redefined the various blood pressure categories to now include normal, elevated, Stage 1 hypertension, Stage 2 hypertension, and hypertensive crisis (see "Blood pressure categories").  Blood pressure categories  Blood pressure category SYSTOLIC (upper number)  DIASTOLIC (lower number)  Normal Less than 120 mm Hg and Less than 80 mm Hg  Elevated 120-129 mm Hg and Less than 80 mm Hg  High blood pressure: Stage 1 hypertension 130-139 mm Hg or 80-89 mm Hg  High blood pressure: Stage 2 hypertension 140 mm Hg or higher or 90 mm Hg or higher  Hypertensive crisis (consult your doctor immediately) Higher than 180 mm Hg and/or Higher than 120 mm Hg  Source: American Heart Association and American Stroke Association. For more on getting your blood pressure under control, buy Controlling Your Blood Pressure, a Special Health Report from [facility].   Blood Pressure Log   Date   Time  Blood Pressure  Position  Example: Nov 1 9 AM 124/78 sitting

## 2019-06-11 NOTE — Progress Notes (Signed)
Office Visit    Patient Name: [name] Date of Encounter: 06/11/2019  Primary Care Provider:  Lavera Guise, MD Primary Cardiologist:  Nelva Bush, MD Electrophysiologist:  None   Chief Complaint    [name] is a 79 y.o. female with a hx of valvular heart disease (mild-mod MR, AI, TR and mild AS), HTN, DM2 presents today for 1 month follow up of valvular heart disease and dyspnea.    Past Medical History    Past Medical History:  Diagnosis Date  . Anemia   . Arthritis   . Diabetes mellitus without complication ([place])   . GERD (gastroesophageal reflux disease)   . Heart murmur   . Hyperlipidemia   . Hypertension   . Rheumatoid arthritis [facility])    Past Surgical History:  Procedure Laterality Date  . ABDOMINAL HYSTERECTOMY    . COLONOSCOPY WITH PROPOFOL N/A 10/25/2017   Procedure: COLONOSCOPY WITH PROPOFOL;  Surgeon: Lollie Sails, MD;  Location: [facility] ENDOSCOPY;  Service: Endoscopy;  Laterality: N/A;  . ESOPHAGOGASTRODUODENOSCOPY (EGD) WITH PROPOFOL N/A 10/25/2017   Procedure: ESOPHAGOGASTRODUODENOSCOPY (EGD) WITH PROPOFOL;  Surgeon: Lollie Sails, MD;  Location: [facility] ENDOSCOPY;  Service: Endoscopy;  Laterality: N/A;  . ESOPHAGOGASTRODUODENOSCOPY (EGD) WITH PROPOFOL N/A 03/04/2019   Procedure: ESOPHAGOGASTRODUODENOSCOPY (EGD) WITH PROPOFOL;  Surgeon: Lollie Sails, MD;  Location: [facility] ENDOSCOPY;  Service: Endoscopy;  Laterality: N/A;  . EUS N/A 01/17/2018   Procedure: ESOPHAGEAL ENDOSCOPIC ULTRASOUND (EUS) RADIAL;  Surgeon: Reita Cliche, MD;  Location: [facility] ENDOSCOPY;  Service: Gastroenterology;  Laterality: N/A;  . right side had fatty tissue taken off    . TONSILLECTOMY      Allergies  No Known Allergies  History of Present Illness    [name] is a 79 y.o. female with a hx of valvular heart disease (mild-mod MR, AI, TR and mild AS), HTN, DM2 last seen by Dr. Saunders Revel 05/08/19.  At last office visit BP noted to be elevated -  hydralazine discontinued and added HCTZ 25mg  daily for optimization of blood pressure and to simplify her medication regimen.  Reports feeling overall well. Her chief complaint is her arthritis.  Tells me it "moves around a lot ".  But she tries to relieve it by staying active.  She lives at home with her son.  Checks her BP at home with readings 140-160.  Tells me she does not check her blood pressure every day.  Has a somewhat difficult time recalling her readings at home.  She denies severe episodes of hypotension or hypertension.  She does note that she has no real routine and sometimes takes her medications 2 hours earlier 2 hours later than the day prior.  We discussed changing her medication time to bedtime if that would be a more consistent time for her.  Reports her DOE is at her baseline and feels it is due to her arthritis.   Denies chest pain, pressure, tightness.  She reports no SLB rest, orthopnea, PND.  Denies lower extremity edema, palpitations.  EKGs/Labs/Other Studies Reviewed:   The following studies were reviewed today:  Echo 01/24/19 LVEF [id number], mild diastolic dysfunction, moderate LVH RV normal size and function. LA mildly dilated Mild AS, moderate AI Mild MR Moderate TR Mild pulmonary HTN Trace-mild pulmonic regurgitation  EKG:  EKG is ordered today.  The ekg ordered today demonstrates sinus bradycardia 56 bpm with PVC and voltage criteria for LVH.  Recent Labs: 07/25/2018: TSH 1.580 05/08/2019: BUN 15; Creatinine,  Ser 0.93; Potassium 4.2; Sodium 142  Recent Lipid Panel    Component Value Date/Time   CHOL 228 (H) 07/25/2018 1624   TRIG 81 07/25/2018 1624   HDL 92 07/25/2018 1624   LDLCALC 120 (H) 07/25/2018 1624    Home Medications   Current Meds  Medication Sig  . acetaminophen (TYLENOL) 500 MG tablet Take 500-1,000 mg by mouth every 6 (six) hours as needed.  . Alcohol Swabs (ALCOHOL PREP) PADS alcohol prep pads  . amLODipine (NORVASC) 5 MG tablet Take  2 tablets (10 mg total) by mouth every evening.  Marland Kitchen aspirin EC 81 MG tablet Take 81 mg by mouth daily.  Marland Kitchen atorvastatin (LIPITOR) 10 MG tablet TAKE 1 TABLET AT BEDTIME   FOR HIGH CHOLESTEROL  . Ferrous Gluconate (IRON 27 PO) Take 27 mg by mouth.  . folic acid (FOLVITE) 1 MG tablet Take 2 tablets (2 mg total) by mouth daily.  Marland Kitchen glimepiride (AMARYL) 4 MG tablet Take 1/2 tablet with breakfast and 1/2 tablet with dinner  . glucose blood (ONETOUCH VERIO) test strip Use as directed  For twice a day diag e11.65  . hydrochlorothiazide (HYDRODIURIL) 25 MG tablet Take 1 tablet (25 mg total) by mouth daily.  . meloxicam (MOBIC) 7.5 MG tablet TAKE 1 TABLET EVERY MORNINGFOR ARTHRITIS (Patient taking differently: Take 7.5 mg by mouth as needed. )  . methotrexate 2.5 MG tablet Take 15 mg by mouth daily.  . pantoprazole (PROTONIX) 40 MG tablet TAKE 1 TABLET DAILY FOR    REFLUX (Patient taking differently: 40 mg 2 (two) times daily before a meal. )  . Zoster Vaccine Adjuvanted [facility]) injection Shingles vaccine series - inject IM as directed .    Review of Systems       Review of Systems  Constitution: Negative for chills, fever and malaise/fatigue.  Cardiovascular: Negative for chest pain, dyspnea on exertion, leg swelling, near-syncope, orthopnea, palpitations and syncope.  Respiratory: Negative for cough, shortness of breath and wheezing.   Musculoskeletal: Positive for arthritis and joint pain.  Gastrointestinal: Negative for nausea and vomiting.  Neurological: Negative for dizziness, light-headedness and weakness.   All other systems reviewed and are otherwise negative except as noted above.  Physical Exam    VS:  BP (!) 148/78 (BP Location: Left Arm, Patient Position: Sitting, Cuff Size: Normal)   Pulse 80   Resp 16   Ht 5\' 2"  (1.575 m)   Wt 138 lb 8 oz (62.8 kg)   SpO2 98%   BMI 25.33 kg/m  , BMI Body mass index is 25.33 kg/m. GEN: Well nourished, well developed, in no acute  distress. HEENT: normal. Neck: Supple, no JVD, carotid bruits, or masses. Cardiac: Bradycardic, regular, gr 2/6 systolic murmur. No rubs, or gallops. No clubbing, cyanosis, edema.  Radials/DP/PT 2+ and equal bilaterally.  Respiratory:  Respirations regular and unlabored, clear to auscultation bilaterally. GI: Soft, nontender, nondistended, BS + x 4. MS: No deformity or atrophy. Skin: Warm and dry, no rash. Neuro:  Strength and sensation are intact. Psych: Normal affect.  Accessory Clinical Findings    ECG personally reviewed by me today -sinus bradycardia 56 bpm with PVC and voltage criteria for LVH.- no acute changes.  Assessment & Plan    1. Mixed valvular heart disease -Stable DOE reported today.  Euvolemic on exam.  Continue to optimize BP to hopefully prevent/slow progression of her valvular heart disease.  BP medications adjusted, as below.  2. HTN -BP remains elevated.  Goal BP less than  130/80.  Reports she takes her medications not always at a consistent time -we discussed finding a consistent holiday to take her medications..  Continue amlodipine 10 mg daily, HCTZ 25 mg daily.  Start losartan 25 mg daily.  BMP today.  3. DOE- Likely multifactorial mixed valvular heart disease, deconditioning secondary to arthritis.  At baseline.  No indication for further work-up at this time.  4. Chest pain -noted her previous office visit.  Denies recurrence.  No indication for ischemic evaluation at this time.  Disposition: Follow up in 3 month(s) with Dr. Saunders Revel or APP  Loel Dubonnet, NP 06/11/2019, 9:04 AM

## 2019-06-12 ENCOUNTER — Telehealth: Payer: Self-pay

## 2019-06-12 DIAGNOSIS — I10 Essential (primary) hypertension: Secondary | ICD-10-CM

## 2019-06-12 LAB — BASIC METABOLIC PANEL
BUN/Creatinine Ratio: 20 (ref 12–28)
BUN: 21 mg/dL (ref 8–27)
CO2: 28 mmol/L (ref 20–29)
Calcium: 9.5 mg/dL (ref 8.7–10.3)
Chloride: 99 mmol/L (ref 96–106)
Creatinine, Ser: 1.03 mg/dL — ABNORMAL HIGH (ref 0.57–1.00)
GFR calc Af Amer: 59 mL/min/{1.73_m2} — ABNORMAL LOW (ref >59)
GFR calc non Af Amer: 51 mL/min/{1.73_m2} — ABNORMAL LOW (ref >59)
Glucose: 143 mg/dL — ABNORMAL HIGH (ref 65–99)
Potassium: 3.8 mmol/L (ref 3.5–5.2)
Sodium: 142 mmol/L (ref 134–144)

## 2019-06-12 NOTE — Telephone Encounter (Signed)
Patient made aware of lab results and Laurann Montana, NP recommendation. Order is in [place] for 2 week bmet [facility]. Patient voiced appreciation for the call.

## 2019-06-12 NOTE — Telephone Encounter (Signed)
-----   Message from Loel Dubonnet, NP sent at 06/12/2019  9:47 AM EST ----- Kidney function stable. Electrolytes normal. Continue plan as discussed in office visit. Please have her come for repeat BMP in 2 weeks as we started Losartan in the office.

## 2019-06-13 ENCOUNTER — Telehealth: Payer: Self-pay | Admitting: Internal Medicine

## 2019-06-13 ENCOUNTER — Other Ambulatory Visit: Payer: Self-pay

## 2019-06-13 DIAGNOSIS — M0579 Rheumatoid arthritis with rheumatoid factor of multiple sites without organ or systems involvement: Secondary | ICD-10-CM

## 2019-06-13 MED ORDER — FOLIC ACID 1 MG PO TABS: 2.0000 mg | ORAL_TABLET | Freq: Every day | ORAL | 3 refills | Status: DC

## 2019-06-13 NOTE — Telephone Encounter (Signed)
Spoke with patient and we clarified medications. She knows to continue HCTZ 25 mg and amlodipine 10 mg daily and add losartan 25 mg daily to her regimen.  She just wanted to make sure. She was appreciative.

## 2019-06-13 NOTE — Telephone Encounter (Signed)
Please call to discuss Hydrochlorothiazide. She thought she was supposed to stop taking a medication and is not sure which one.

## 2019-06-14 DIAGNOSIS — R69 Illness, unspecified: Secondary | ICD-10-CM | POA: Diagnosis not present

## 2019-06-16 ENCOUNTER — Other Ambulatory Visit: Payer: Self-pay

## 2019-06-16 DIAGNOSIS — M0579 Rheumatoid arthritis with rheumatoid factor of multiple sites without organ or systems involvement: Secondary | ICD-10-CM

## 2019-06-16 MED ORDER — FOLIC ACID 1 MG PO TABS: 2.0000 mg | ORAL_TABLET | Freq: Every day | ORAL | 1 refills | Status: DC

## 2019-07-02 DIAGNOSIS — Z79899 Other long term (current) drug therapy: Secondary | ICD-10-CM | POA: Diagnosis not present

## 2019-07-02 DIAGNOSIS — M0579 Rheumatoid arthritis with rheumatoid factor of multiple sites without organ or systems involvement: Secondary | ICD-10-CM | POA: Diagnosis not present

## 2019-07-03 DIAGNOSIS — H40003 Preglaucoma, unspecified, bilateral: Secondary | ICD-10-CM | POA: Diagnosis not present

## 2019-07-04 DIAGNOSIS — L600 Ingrowing nail: Secondary | ICD-10-CM | POA: Diagnosis not present

## 2019-07-04 DIAGNOSIS — M79674 Pain in right toe(s): Secondary | ICD-10-CM | POA: Diagnosis not present

## 2019-07-04 DIAGNOSIS — B351 Tinea unguium: Secondary | ICD-10-CM | POA: Diagnosis not present

## 2019-07-04 DIAGNOSIS — M79675 Pain in left toe(s): Secondary | ICD-10-CM | POA: Diagnosis not present

## 2019-07-05 DIAGNOSIS — I10 Essential (primary) hypertension: Secondary | ICD-10-CM | POA: Diagnosis not present

## 2019-07-05 DIAGNOSIS — E785 Hyperlipidemia, unspecified: Secondary | ICD-10-CM | POA: Diagnosis not present

## 2019-07-05 DIAGNOSIS — K219 Gastro-esophageal reflux disease without esophagitis: Secondary | ICD-10-CM | POA: Diagnosis not present

## 2019-07-05 DIAGNOSIS — Z791 Long term (current) use of non-steroidal anti-inflammatories (NSAID): Secondary | ICD-10-CM | POA: Diagnosis not present

## 2019-07-05 DIAGNOSIS — Z008 Encounter for other general examination: Secondary | ICD-10-CM | POA: Diagnosis not present

## 2019-07-05 DIAGNOSIS — M1990 Unspecified osteoarthritis, unspecified site: Secondary | ICD-10-CM | POA: Diagnosis not present

## 2019-07-05 DIAGNOSIS — Z6825 Body mass index (BMI) 25.0-25.9, adult: Secondary | ICD-10-CM | POA: Diagnosis not present

## 2019-07-05 DIAGNOSIS — Z7982 Long term (current) use of aspirin: Secondary | ICD-10-CM | POA: Diagnosis not present

## 2019-07-05 DIAGNOSIS — E663 Overweight: Secondary | ICD-10-CM | POA: Diagnosis not present

## 2019-07-05 DIAGNOSIS — M069 Rheumatoid arthritis, unspecified: Secondary | ICD-10-CM | POA: Diagnosis not present

## 2019-07-05 DIAGNOSIS — E1165 Type 2 diabetes mellitus with hyperglycemia: Secondary | ICD-10-CM | POA: Diagnosis not present

## 2019-07-25 DIAGNOSIS — R69 Illness, unspecified: Secondary | ICD-10-CM | POA: Diagnosis not present

## 2019-08-12 ENCOUNTER — Telehealth: Payer: Self-pay

## 2019-08-12 NOTE — Telephone Encounter (Signed)
CONFIRMED AND SCREENED FOR 08-14-19 OV. 

## 2019-08-14 ENCOUNTER — Other Ambulatory Visit: Payer: Self-pay

## 2019-08-14 ENCOUNTER — Ambulatory Visit (INDEPENDENT_AMBULATORY_CARE_PROVIDER_SITE_OTHER): Payer: Medicare HMO | Admitting: Nurse Practitioner

## 2019-08-14 ENCOUNTER — Encounter: Payer: Self-pay | Admitting: Nurse Practitioner

## 2019-08-14 VITALS — BP 144/62 | HR 74 | Temp 96.4°F | Resp 16 | Ht 62.0 in | Wt 139.2 lb

## 2019-08-14 DIAGNOSIS — E782 Mixed hyperlipidemia: Secondary | ICD-10-CM | POA: Diagnosis not present

## 2019-08-14 DIAGNOSIS — I059 Rheumatic mitral valve disease, unspecified: Secondary | ICD-10-CM | POA: Diagnosis not present

## 2019-08-14 DIAGNOSIS — I10 Essential (primary) hypertension: Secondary | ICD-10-CM

## 2019-08-14 DIAGNOSIS — E1165 Type 2 diabetes mellitus with hyperglycemia: Secondary | ICD-10-CM

## 2019-08-14 LAB — POCT GLYCOSYLATED HEMOGLOBIN (HGB A1C): Hemoglobin A1C: 6.9 % — AB (ref 4.0–5.6)

## 2019-08-14 MED ORDER — GLIMEPIRIDE 4 MG PO TABS: ORAL_TABLET | ORAL | 3 refills | Status: DC

## 2019-08-14 NOTE — Progress Notes (Signed)
[facility] [place], [place] [address]  Internal MEDICINE  Office Visit Note  Patient Name: [name]  [id number]  [id number]  Date of Service: 08/23/2019  Chief Complaint  Patient presents with  . Diabetes    would like to ask about freestyle libre and how it is attached to skin   . Hyperlipidemia  . Hypertension  . Toe Pain  . Foot Pain  . Medication Refill    glimepiride,    The patient is here for routine follow up. Blood sugars are doing well. HgbA1c is 6.9 today, up from 6.0 at last check. Blood pressure is well controlled. Does have some back pain, worse first thing in the morning. Gets better as the day goes on. She does have history of rheumatoid arthritis. States that she has been sleeping with her head elevated by two or three pillows which may be contributing to the pain she feels first thing in the morning. She has had both Moderna COVID 19 vaccines. Had no negative side effects to report.       Current Medication: Outpatient Encounter Medications as of 08/14/2019  Medication Sig Note  . acetaminophen (TYLENOL) 500 MG tablet Take 500-1,000 mg by mouth every 6 (six) hours as needed.   . Alcohol Swabs (ALCOHOL PREP) PADS alcohol prep pads   . amLODipine (NORVASC) 5 MG tablet Take 2 tablets (10 mg total) by mouth every evening.   Marland Kitchen aspirin EC 81 MG tablet Take 81 mg by mouth daily.   Marland Kitchen atorvastatin (LIPITOR) 10 MG tablet TAKE 1 TABLET AT BEDTIME   FOR HIGH CHOLESTEROL   . Ferrous Gluconate (IRON 27 PO) Take 27 mg by mouth.   . folic acid (FOLVITE) 1 MG tablet Take 2 tablets (2 mg total) by mouth daily.   Marland Kitchen glimepiride (AMARYL) 4 MG tablet Take 1/2 tablet with breakfast and 1/2 tablet with dinner   . glucose blood (ONETOUCH VERIO) test strip Use as directed  For twice a day diag e11.65   . lidocaine (LIDODERM) 5 % Place 1 patch onto the skin every 12 (twelve) hours. Remove & Discard patch within 12 hours or as directed by MD   .  losartan (COZAAR) 25 MG tablet Take 1 tablet (25 mg total) by mouth daily.   . meloxicam (MOBIC) 7.5 MG tablet TAKE 1 TABLET EVERY MORNINGFOR ARTHRITIS (Patient taking differently: Take 7.5 mg by mouth as needed. )   . methotrexate 2.5 MG tablet Take 15 mg by mouth daily. 03/31/2017: Patient takes 6 tablets every Saturday  . pantoprazole (PROTONIX) 40 MG tablet TAKE 1 TABLET DAILY FOR    REFLUX (Patient taking differently: 40 mg 2 (two) times daily before a meal. )   . Zoster Vaccine Adjuvanted [facility]) injection Shingles vaccine series - inject IM as directed .   . [DISCONTINUED] glimepiride (AMARYL) 4 MG tablet Take 1/2 tablet with breakfast and 1/2 tablet with dinner   . hydrochlorothiazide (HYDRODIURIL) 25 MG tablet Take 1 tablet (25 mg total) by mouth daily.    No facility-administered encounter medications on file as of 08/14/2019.    Surgical History: Past Surgical History:  Procedure Laterality Date  . ABDOMINAL HYSTERECTOMY    . COLONOSCOPY WITH PROPOFOL N/A 10/25/2017   Procedure: COLONOSCOPY WITH PROPOFOL;  Surgeon: Lollie Sails, MD;  Location: [facility] ENDOSCOPY;  Service: Endoscopy;  Laterality: N/A;  . ESOPHAGOGASTRODUODENOSCOPY (EGD) WITH PROPOFOL N/A 10/25/2017   Procedure: ESOPHAGOGASTRODUODENOSCOPY (EGD) WITH PROPOFOL;  Surgeon: Lollie Sails,  MD;  Location: [facility] ENDOSCOPY;  Service: Endoscopy;  Laterality: N/A;  . ESOPHAGOGASTRODUODENOSCOPY (EGD) WITH PROPOFOL N/A 03/04/2019   Procedure: ESOPHAGOGASTRODUODENOSCOPY (EGD) WITH PROPOFOL;  Surgeon: Lollie Sails, MD;  Location: [facility] ENDOSCOPY;  Service: Endoscopy;  Laterality: N/A;  . EUS N/A 01/17/2018   Procedure: ESOPHAGEAL ENDOSCOPIC ULTRASOUND (EUS) RADIAL;  Surgeon: Reita Cliche, MD;  Location: [facility] ENDOSCOPY;  Service: Gastroenterology;  Laterality: N/A;  . right side had fatty tissue taken off    . TONSILLECTOMY      Medical History: Past Medical History:  Diagnosis Date  . Anemia   . Arthritis   .  Diabetes mellitus without complication ([place])   . GERD (gastroesophageal reflux disease)   . Heart murmur   . Hyperlipidemia   . Hypertension   . Rheumatoid arthritis ([place])     Family History: Family History  Problem Relation Age of Onset  . Hypertension Mother   . Diabetes Father   . Heart disease Brother 43       Open heart surgery (details unknown)  . Breast cancer Neg Hx     Social History   Socioeconomic History  . Marital status: Widowed    Spouse name: Not on file  . Number of children: Not on file  . Years of education: Not on file  . Highest education level: Not on file  Occupational History  . Not on file  Tobacco Use  . Smoking status: Never Smoker  . Smokeless tobacco: Never Used  Substance and Sexual Activity  . Alcohol use: No  . Drug use: No  . Sexual activity: Not on file  Other Topics Concern  . Not on file  Social History Narrative  . Not on file   Social Determinants of Health   Financial Resource Strain:   . Difficulty of Paying Living Expenses:   Food Insecurity:   . Worried About Charity fundraiser in the Last Year:   . Arboriculturist in the Last Year:   Transportation Needs:   . Film/video editor (Medical):   Marland Kitchen Lack of Transportation (Non-Medical):   Physical Activity:   . Days of Exercise per Week:   . Minutes of Exercise per Session:   Stress:   . Feeling of Stress :   Social Connections:   . Frequency of Communication with Friends and Family:   . Frequency of Social Gatherings with Friends and Family:   . Attends Religious Services:   . Active Member of Clubs or Organizations:   . Attends Archivist Meetings:   Marland Kitchen Marital Status:   Intimate Partner Violence:   . Fear of Current or Ex-Partner:   . Emotionally Abused:   Marland Kitchen Physically Abused:   . Sexually Abused:       Review of Systems  Constitutional: Negative for activity change, chills, fatigue and unexpected weight change.  HENT: Negative for  congestion, postnasal drip, rhinorrhea, sneezing and sore throat.   Respiratory: Negative for cough, chest tightness, shortness of breath and wheezing.   Cardiovascular: Negative for chest pain and palpitations.  Gastrointestinal: Negative for abdominal pain, constipation, diarrhea, nausea and vomiting.  Endocrine: Negative for cold intolerance, heat intolerance, polydipsia and polyuria.       Blood sugars doing well   Musculoskeletal: Positive for arthralgias, joint swelling and myalgias. Negative for back pain and neck pain.       Did have flare of rheumatoid arthritis since thanksgiving. Took prednisone for a few days and she  is improving.   Skin: Negative for rash.  Allergic/Immunologic: Negative for environmental allergies, food allergies and immunocompromised state.  Neurological: Negative for dizziness, tremors, numbness and headaches.  Hematological: Negative for adenopathy. Does not bruise/bleed easily.  Psychiatric/Behavioral: Negative for behavioral problems (Depression), sleep disturbance and suicidal ideas. The patient is nervous/anxious.     Today's Vitals   08/14/19 1045  BP: (!) 144/62  Pulse: 74  Resp: 16  Temp: (!) 96.4 F (35.8 C)  SpO2: 99%  Weight: 139 lb 3.2 oz (63.1 kg)  Height: 5\' 2"  (1.575 m)   Body mass index is 25.46 kg/m.  Physical Exam Vitals and nursing note reviewed.  Constitutional:      General: She is not in acute distress.    Appearance: Normal appearance. She is well-developed. She is not diaphoretic.  HENT:     Head: Normocephalic and atraumatic.     Nose: Nose normal.     Mouth/Throat:     Pharynx: No oropharyngeal exudate.  Eyes:     Conjunctiva/sclera: Conjunctivae normal.     Pupils: Pupils are equal, round, and reactive to light.  Neck:     Thyroid: No thyromegaly.     Vascular: No carotid bruit or JVD.     Trachea: No tracheal deviation.  Cardiovascular:     Rate and Rhythm: Normal rate and regular rhythm.     Heart sounds:  Murmur present. No friction rub. No gallop.   Pulmonary:     Effort: Pulmonary effort is normal. No respiratory distress.     Breath sounds: Normal breath sounds. No wheezing or rales.  Chest:     Chest wall: No tenderness.  Abdominal:     Palpations: Abdomen is soft.  Musculoskeletal:        General: Normal range of motion.     Cervical back: Normal range of motion and neck supple.     Comments: Generalized joint pain with arthritic nodules present, especially in the joints of the fingers and hands. She also has left knee swelling and tenderness. ROM and strength are mildly diminished due to pain.   Lymphadenopathy:     Cervical: No cervical adenopathy.  Skin:    General: Skin is warm and dry.  Neurological:     Mental Status: She is alert and oriented to person, place, and time.     Cranial Nerves: No cranial nerve deficit.  Psychiatric:        Behavior: Behavior normal.        Thought Content: Thought content normal.        Judgment: Judgment normal.   Assessment/Plan: 1. Type 2 diabetes mellitus with hyperglycemia, without long-term current use of insulin (HCC) - POCT HgB A1C 6.9 today. Continue diabetic medication as prescribed.  - glimepiride (AMARYL) 4 MG tablet; Take 1/2 tablet with breakfast and 1/2 tablet with dinner  Dispense: 90 tablet; Refill: 3  2. Essential hypertension Stable. Continue bp medication as prescribed.   3. Mitral valvular disorder Continue regular visits with cardiology as scheduled.   4. Mixed hyperlipidemia Continue atorvastatin as prescribed     General Counseling: Denielle verbalizes understanding of the findings of todays visit and agrees with plan of treatment. I have discussed any further diagnostic evaluation that may be needed or ordered today. We also reviewed her medications today. she has been encouraged to call the office with any questions or concerns that should arise related to todays visit.  Diabetes Counseling:  1. Addition of  ACE inh/ ARB'S for  nephroprotection. Microalbumin is updated  2. Diabetic foot care, prevention of complications. Podiatry consult 3. Exercise and lose weight.  4. Diabetic eye examination, Diabetic eye exam is updated  5. Monitor blood sugar closlely. nutrition counseling.  6. Sign and symptoms of hypoglycemia including shaking sweating,confusion and headaches.  This patient was seen by Leretha Pol FNP Collaboration with Dr Lavera Guise as a part of collaborative care agreement  Orders Placed This Encounter  Procedures  . POCT HgB A1C    Meds ordered this encounter  Medications  . glimepiride (AMARYL) 4 MG tablet    Sig: Take 1/2 tablet with breakfast and 1/2 tablet with dinner    Dispense:  90 tablet    Refill:  3    Order Specific Question:   Supervising Provider    Answer:   Lavera Guise [id number]    Total time spent: 30 Minutes   Time spent includes review of chart, medications, test results, and follow up plan with the patient.      Dr Lavera Guise Internal medicine

## 2019-09-04 DIAGNOSIS — R69 Illness, unspecified: Secondary | ICD-10-CM | POA: Diagnosis not present

## 2019-09-12 ENCOUNTER — Ambulatory Visit: Payer: Medicare HMO | Admitting: Internal Medicine

## 2019-09-12 NOTE — Progress Notes (Deleted)
Follow-up Outpatient Visit Date: 09/12/2019  Primary Care Provider: Lavera Guise, [place] [address]  Chief Complaint: ***  HPI:  [name] is a 79 y.o. female with history of valvular heart disease (mild to moderate MR, AI, and TR as well as mild AS), hypertension, and type 2 diabetes mellitus, who presents for follow-up of valvular heart disease.  She was last seen in our office in mid January by Laurann Montana, NP, at which time she reported stable exertional dyspnea.  She was primarily limited by her arthritis.  Blood pressure remained elevated, prompting addition of losartan 25 mg daily.  --------------------------------------------------------------------------------------------------  Cardiovascular History & Procedures: Cardiovascular Problems:  Mixed valvular heart disease  Risk Factors:  Hypertension, diabetes mellitus, and age > 38  Cath/PCI:  None  CV Surgery:  None  EP Procedures and Devices:  None  Non-Invasive Evaluation(s):  TTE (01/24/2019): Small left ventricle with moderate LVH. Hyperdynamic contraction (LVEF greater than 70%). Mild aortic valve calcification with mild stenosis and moderate regurgitation. Moderate mitral annular calcification with mild regurgitation. Mild tricuspid regurgitation. Mild pulmonary hypertension.  Recent CV Pertinent Labs: Lab Results  Component Value Date   CHOL 228 (H) 07/25/2018   HDL 92 07/25/2018   LDLCALC 120 (H) 07/25/2018   TRIG 81 07/25/2018   K 3.8 06/11/2019   BUN 21 06/11/2019   CREATININE 1.03 (H) 06/11/2019    Past medical and surgical history were reviewed and updated in EPIC.  No outpatient medications have been marked as taking for the 09/12/19 encounter (Appointment) with [name], Becky Gave, MD.    Allergies: Patient has no known allergies.  Social History   Tobacco Use  . Smoking status: Never Smoker  . Smokeless tobacco: Never Used  Substance Use Topics    . Alcohol use: No  . Drug use: No    Family History  Problem Relation Age of Onset  . Hypertension Mother   . Diabetes Father   . Heart disease Brother 46       Open heart surgery (details unknown)  . Breast cancer Neg Hx     Review of Systems: A 12-system review of systems was performed and was negative except as noted in the HPI.  --------------------------------------------------------------------------------------------------  Physical Exam: There were no vitals taken for this visit.  General:  *** HEENT: No conjunctival pallor or scleral icterus. Facemask in place. Neck: Supple without lymphadenopathy, thyromegaly, JVD, or HJR. Lungs: Normal work of breathing. Clear to auscultation bilaterally without wheezes or crackles. Heart: Regular rate and rhythm without murmurs, rubs, or gallops. Non-displaced PMI. Abd: Bowel sounds present. Soft, NT/ND without hepatosplenomegaly Ext: No lower extremity edema. Radial, PT, and DP pulses are 2+ bilaterally. Skin: Warm and dry without rash.  EKG:  ***  Lab Results  Component Value Date   WBC 4.4 03/31/2017   HGB 11.8 (L) 03/31/2017   HCT 36.7 03/31/2017   MCV 85.4 03/31/2017   PLT 165 03/31/2017    Lab Results  Component Value Date   NA 142 06/11/2019   K 3.8 06/11/2019   CL 99 06/11/2019   CO2 28 06/11/2019   BUN 21 06/11/2019   CREATININE 1.03 (H) 06/11/2019   GLUCOSE 143 (H) 06/11/2019   ALT 18 03/31/2017    Lab Results  Component Value Date   CHOL 228 (H) 07/25/2018   HDL 92 07/25/2018   LDLCALC 120 (H) 07/25/2018   TRIG 81 07/25/2018    --------------------------------------------------------------------------------------------------  ASSESSMENT AND PLAN: [name]  Becky Silverthorne, MD 09/12/2019 6:58 AM

## 2019-09-29 DIAGNOSIS — Z79899 Other long term (current) drug therapy: Secondary | ICD-10-CM | POA: Diagnosis not present

## 2019-09-29 DIAGNOSIS — M0579 Rheumatoid arthritis with rheumatoid factor of multiple sites without organ or systems involvement: Secondary | ICD-10-CM | POA: Diagnosis not present

## 2019-10-07 DIAGNOSIS — M545 Low back pain: Secondary | ICD-10-CM | POA: Diagnosis not present

## 2019-10-07 DIAGNOSIS — G8929 Other chronic pain: Secondary | ICD-10-CM | POA: Diagnosis not present

## 2019-10-07 DIAGNOSIS — Z79899 Other long term (current) drug therapy: Secondary | ICD-10-CM | POA: Diagnosis not present

## 2019-10-07 DIAGNOSIS — E119 Type 2 diabetes mellitus without complications: Secondary | ICD-10-CM | POA: Diagnosis not present

## 2019-10-07 DIAGNOSIS — M0579 Rheumatoid arthritis with rheumatoid factor of multiple sites without organ or systems involvement: Secondary | ICD-10-CM | POA: Diagnosis not present

## 2019-10-17 ENCOUNTER — Encounter: Payer: Self-pay | Admitting: Internal Medicine

## 2019-10-17 ENCOUNTER — Ambulatory Visit (INDEPENDENT_AMBULATORY_CARE_PROVIDER_SITE_OTHER): Payer: Medicare HMO | Admitting: Internal Medicine

## 2019-10-17 VITALS — BP 142/60 | HR 57 | Ht 62.0 in | Wt 139.1 lb

## 2019-10-17 DIAGNOSIS — R0609 Other forms of dyspnea: Secondary | ICD-10-CM

## 2019-10-17 DIAGNOSIS — Z79899 Other long term (current) drug therapy: Secondary | ICD-10-CM | POA: Diagnosis not present

## 2019-10-17 DIAGNOSIS — I38 Endocarditis, valve unspecified: Secondary | ICD-10-CM | POA: Diagnosis not present

## 2019-10-17 DIAGNOSIS — R0600 Dyspnea, unspecified: Secondary | ICD-10-CM | POA: Diagnosis not present

## 2019-10-17 DIAGNOSIS — I10 Essential (primary) hypertension: Secondary | ICD-10-CM | POA: Diagnosis not present

## 2019-10-17 MED ORDER — LOSARTAN POTASSIUM 50 MG PO TABS: 50.0000 mg | ORAL_TABLET | Freq: Every day | ORAL | 1 refills | Status: DC

## 2019-10-17 NOTE — Progress Notes (Signed)
Follow-up Outpatient Visit Date: 10/17/2019  Primary Care Provider: Lavera Guise, [place] [address]  Chief Complaint: Follow-up valvular heart disease  HPI:  [name] is a 79 y.o. female with history of valvular heart disease (mild to moderate MR, AI, and TR as well as mild AS), hypertension, hyperlipidemia, type 2 diabetes mellitus, rheumatoid arthritis, and anemia who presents for follow-up of valvular heart disease.  I last saw her in 04/2019, at which time she was feeling relatively well.  Chronic exertional dyspnea was unchanged from baseline.  She endorsed a few sporadic "twitches" in the left side of her chest that were nonexertional and not associated with other symptoms.  Blood pressure was elevated.  In an effort to optimize her regimen and make compliance easier, we discontinued hydralazine and added HCTZ 25 mg daily.  [name] was seen for follow-up in 05/2019 by Laurann Montana, NP, which time she was feeling well.  Blood pressure was still mildly elevated.  Therefore, losartan 25 mg daily was added to her regimen of amlodipine and HCTZ.  Today, [name] reports feeling relatively well.  She has stable exertional dyspnea.  She notes occasional swelling in her hands but no significant leg edema.  She denies chest pain.  Rare palpitations have improved from prior visits.  She is tolerating her current medications well.  --------------------------------------------------------------------------------------------------  Cardiovascular History & Procedures: Cardiovascular Problems:  Mixed valvular heart disease  Risk Factors:  Hypertension, diabetes mellitus, and age > 49  Cath/PCI:  None  CV Surgery:  None  EP Procedures and Devices:  None  Non-Invasive Evaluation(s):  TTE (01/24/2019): Small left ventricle with moderate LVH. Hyperdynamic contraction (LVEF greater than 70%). Mild aortic valve calcification with mild stenosis and  moderate regurgitation. Moderate mitral annular calcification with mild regurgitation. Mild tricuspid regurgitation. Mild pulmonary hypertension.  Recent CV Pertinent Labs: Lab Results  Component Value Date   CHOL 228 (H) 07/25/2018   HDL 92 07/25/2018   LDLCALC 120 (H) 07/25/2018   TRIG 81 07/25/2018   K 3.8 06/11/2019   BUN 21 06/11/2019   CREATININE 1.03 (H) 06/11/2019    Past medical and surgical history were reviewed and updated in EPIC.  Current Meds  Medication Sig  . acetaminophen (TYLENOL) 500 MG tablet Take 500-1,000 mg by mouth every 6 (six) hours as needed.  . Alcohol Swabs (ALCOHOL PREP) PADS alcohol prep pads  . amLODipine (NORVASC) 5 MG tablet Take 2 tablets (10 mg total) by mouth every evening.  Marland Kitchen aspirin EC 81 MG tablet Take 81 mg by mouth daily.  Marland Kitchen atorvastatin (LIPITOR) 10 MG tablet TAKE 1 TABLET AT BEDTIME   FOR HIGH CHOLESTEROL  . Ferrous Gluconate (IRON 27 PO) Take 27 mg by mouth daily.   . folic acid (FOLVITE) 1 MG tablet Take 2 tablets (2 mg total) by mouth daily.  Marland Kitchen glimepiride (AMARYL) 4 MG tablet Take 1/2 tablet with breakfast and 1/2 tablet with dinner  . glucose blood (ONETOUCH VERIO) test strip Use as directed  For twice a day diag e11.65  . hydrochlorothiazide (HYDRODIURIL) 25 MG tablet Take 1 tablet (25 mg total) by mouth daily.  Marland Kitchen lidocaine (LIDODERM) 5 % Place 1 patch onto the skin every 12 (twelve) hours. Remove & Discard patch within 12 hours or as directed by MD  . meloxicam (MOBIC) 7.5 MG tablet TAKE 1 TABLET EVERY MORNINGFOR ARTHRITIS (Patient taking differently: Take 7.5 mg by mouth as needed. )  . methotrexate 2.5 MG  tablet Take 15 mg by mouth daily.  . pantoprazole (PROTONIX) 40 MG tablet TAKE 1 TABLET DAILY FOR    REFLUX (Patient taking differently: 40 mg 2 (two) times daily before a meal. )  . Zoster Vaccine Adjuvanted [facility]) injection Shingles vaccine series - inject IM as directed .    Allergies: Patient has no known  allergies.  Social History   Tobacco Use  . Smoking status: Never Smoker  . Smokeless tobacco: Never Used  Substance Use Topics  . Alcohol use: No  . Drug use: No    Family History  Problem Relation Age of Onset  . Hypertension Mother   . Diabetes Father   . Heart disease Brother 89       Open heart surgery (details unknown)  . Breast cancer Neg Hx     Review of Systems: A 12-system review of systems was performed and was negative except as noted in the HPI.  --------------------------------------------------------------------------------------------------  Physical Exam: BP (!) 142/60 (BP Location: Left Arm, Patient Position: Sitting, Cuff Size: Normal)   Pulse (!) 57   Ht 5\' 2"  (1.575 m)   Wt 139 lb 2 oz (63.1 kg)   SpO2 98%   BMI 25.45 kg/m   General:  NAD. Neck: No JVD or HJR. Lungs: CTAB. Heart: Bradycardic but regular with 2/6 systolic murmur.  No rubs or gallops. Abd: Soft, NT/ND. Ext: Trace pretibial edema.  EKG:  Sinus bradycardia (HR 57 bpm) with LVH and abnormal repolarization.  Lab Results  Component Value Date   WBC 4.4 03/31/2017   HGB 11.8 (L) 03/31/2017   HCT 36.7 03/31/2017   MCV 85.4 03/31/2017   PLT 165 03/31/2017    Lab Results  Component Value Date   NA 142 06/11/2019   K 3.8 06/11/2019   CL 99 06/11/2019   CO2 28 06/11/2019   BUN 21 06/11/2019   CREATININE 1.03 (H) 06/11/2019   GLUCOSE 143 (H) 06/11/2019   ALT 18 03/31/2017    Lab Results  Component Value Date   CHOL 228 (H) 07/25/2018   HDL 92 07/25/2018   LDLCALC 120 (H) 07/25/2018   TRIG 81 07/25/2018    --------------------------------------------------------------------------------------------------  ASSESSMENT AND PLAN: Valvular heart disease with dyspnea on exertion: Stable NYHA class II symptoms without significant volume overload on exam today.  Due to suboptimal BP control, we will escalate losartan.  We discussed noninvasive ischemia testing, given continued  exertional dyspnea and cardiac risk factors.  However, [name] would like to defer additional testing at this time in favor of continued medication therapy with improved blood pressure control.  Hypertension: BP mildly elevated.  We will increase losartan to 50 mg daily with BMP in ~2 weeks.  No other medication changes at this time.  Continued sodium restriction was recommended.  Follow-up: Return to clinic in 3 months.  Nelva [place], MD 10/18/2019 2:28 PM

## 2019-10-17 NOTE — Patient Instructions (Signed)
Medication Instructions:  Your physician has recommended you make the following change in your medication:  1- INCREASE Losartan to 50 mg by mouth once a day.  *If you need a refill on your cardiac medications before your next appointment, please call your pharmacy*   Lab Work: Your physician recommends that you return for lab work in: 2 weeks for BMET at [facility]. - On October 31, 2019. - Please go to the [facility]. You will check in at the front desk to the right as you walk into the atrium. Valet Parking is offered if needed. - No appointment needed. You may go any day between 7 am and 6 pm.  If you have labs (blood work) drawn today and your tests are completely normal, you will receive your results only by: Marland Kitchen MyChart Message (if you have MyChart) OR . A paper copy in the mail If you have any lab test that is abnormal or we need to change your treatment, we will call you to review the results.   Testing/Procedures: none   Follow-Up: At [facility], you and your health needs are our priority.  As part of our continuing mission to provide you with exceptional heart care, we have created designated Provider Care Teams.  These Care Teams include your primary Cardiologist (physician) and Advanced Practice Providers (APPs -  Physician Assistants and Nurse Practitioners) who all work together to provide you with the care you need, when you need it.  We recommend signing up for the patient portal called "MyChart".  Sign up information is provided on this After Visit Summary.  MyChart is used to connect with patients for Virtual Visits (Telemedicine).  Patients are able to view lab/test results, encounter notes, upcoming appointments, etc.  Non-urgent messages can be sent to your provider as well.   To learn more about what you can do with MyChart, go to [email or web address].    Your next appointment:   3 month(s)  The format for your next appointment:   In  Person  Provider:    You may see Nelva [place], MD or one of the following Advanced Practice Providers on your designated Care Team:    Murray Hodgkins, NP  [name], PA-C  Marrianne Mood, PA-C

## 2019-10-18 ENCOUNTER — Encounter: Payer: Self-pay | Admitting: Internal Medicine

## 2019-10-18 DIAGNOSIS — R0600 Dyspnea, unspecified: Secondary | ICD-10-CM | POA: Insufficient documentation

## 2019-10-18 DIAGNOSIS — R0609 Other forms of dyspnea: Secondary | ICD-10-CM | POA: Insufficient documentation

## 2019-10-21 ENCOUNTER — Telehealth: Payer: Self-pay

## 2019-10-21 NOTE — Telephone Encounter (Signed)
Confirmed and screened for 10-23-19 ov. °

## 2019-10-23 ENCOUNTER — Ambulatory Visit (INDEPENDENT_AMBULATORY_CARE_PROVIDER_SITE_OTHER): Payer: Medicare HMO | Admitting: Nurse Practitioner

## 2019-10-23 ENCOUNTER — Encounter: Payer: Self-pay | Admitting: Nurse Practitioner

## 2019-10-23 ENCOUNTER — Other Ambulatory Visit: Payer: Self-pay

## 2019-10-23 VITALS — BP 120/54 | HR 62 | Temp 97.3°F | Resp 16 | Ht 62.0 in | Wt 139.0 lb

## 2019-10-23 DIAGNOSIS — E1165 Type 2 diabetes mellitus with hyperglycemia: Secondary | ICD-10-CM

## 2019-10-23 DIAGNOSIS — M0579 Rheumatoid arthritis with rheumatoid factor of multiple sites without organ or systems involvement: Secondary | ICD-10-CM

## 2019-10-23 DIAGNOSIS — Z1231 Encounter for screening mammogram for malignant neoplasm of breast: Secondary | ICD-10-CM | POA: Diagnosis not present

## 2019-10-23 DIAGNOSIS — Z0001 Encounter for general adult medical examination with abnormal findings: Secondary | ICD-10-CM | POA: Diagnosis not present

## 2019-10-23 DIAGNOSIS — Z23 Encounter for immunization: Secondary | ICD-10-CM

## 2019-10-23 DIAGNOSIS — I10 Essential (primary) hypertension: Secondary | ICD-10-CM | POA: Diagnosis not present

## 2019-10-23 DIAGNOSIS — R300 Dysuria: Secondary | ICD-10-CM

## 2019-10-23 LAB — POCT GLYCOSYLATED HEMOGLOBIN (HGB A1C): Hemoglobin A1C: 6.8 % — AB (ref 4.0–5.6)

## 2019-10-23 MED ORDER — AMLODIPINE BESYLATE 5 MG PO TABS: 10.0000 mg | ORAL_TABLET | Freq: Every evening | ORAL | 0 refills | Status: DC

## 2019-10-23 MED ORDER — PNEUMOCOCCAL 13-VAL CONJ VACC IM SUSP: 0.5000 mL | Freq: Once | INTRAMUSCULAR | 0 refills | Status: AC

## 2019-10-23 MED ORDER — FOLIC ACID 1 MG PO TABS: 2.0000 mg | ORAL_TABLET | Freq: Every day | ORAL | 1 refills | Status: DC

## 2019-10-23 NOTE — Progress Notes (Signed)
[facility] [place], [place] [address]  Internal MEDICINE  Office Visit Note  Patient Name: [name]  [id number]  [id number]  Date of Service: 10/26/2019   Pt is here for routine health maintenance examination  Chief Complaint  Patient presents with  . Hyperglycemia  . Anemia  . Arthritis  . Gastroesophageal Reflux  . Medicare Wellness  . Diabetes  . Hyperlipidemia  . Hypertension  . Quality Metric Gaps    PNA vaccine, diabetic eye exam, diabetic foot exam  . Constipation    not having regular bowel movements, believes its due to all the medications she has to take     The patient is here for health maintenance exam. She states that she is starting to have increased problems with her arthritis. She does have rheumatoid arthritis. She sees rheumatologist. Was offered injections into joint, but passed for now.  She did see her eye doctor in 08/2019. Having worsening cataract. Is holding off on removal until instances of COVID 19 decline more.  She has great control of blood sugars. HgbA1c is 6.8 today. This is stable over last several months. She generally sees Dr Jens Som for diabetic foot care. Her last visit with him was 06/2019. Blood pressure is well managed. She does have valvular heart disease and routinely sees Dr. Saunders Revel, cardiology for management.     Current Medication: Outpatient Encounter Medications as of 10/23/2019  Medication Sig Note  . acetaminophen (TYLENOL) 500 MG tablet Take 500-1,000 mg by mouth every 6 (six) hours as needed.   . Alcohol Swabs (ALCOHOL PREP) PADS alcohol prep pads   . amLODipine (NORVASC) 5 MG tablet Take 2 tablets (10 mg total) by mouth every evening.   Marland Kitchen aspirin EC 81 MG tablet Take 81 mg by mouth daily.   Marland Kitchen atorvastatin (LIPITOR) 10 MG tablet TAKE 1 TABLET AT BEDTIME   FOR HIGH CHOLESTEROL   . Ferrous Gluconate (IRON 27 PO) Take 27 mg by mouth daily.    . folic acid (FOLVITE) 1 MG tablet Take 2 tablets (2  mg total) by mouth daily.   Marland Kitchen glimepiride (AMARYL) 4 MG tablet Take 1/2 tablet with breakfast and 1/2 tablet with dinner   . glucose blood (ONETOUCH VERIO) test strip Use as directed  For twice a day diag e11.65   . hydrALAZINE (APRESOLINE) 10 MG tablet Take 10 mg by mouth 3 (three) times daily.   Marland Kitchen lidocaine (LIDODERM) 5 % Place 1 patch onto the skin every 12 (twelve) hours. Remove & Discard patch within 12 hours or as directed by MD   . losartan (COZAAR) 50 MG tablet Take 1 tablet (50 mg total) by mouth daily.   . meloxicam (MOBIC) 7.5 MG tablet TAKE 1 TABLET EVERY MORNINGFOR ARTHRITIS (Patient taking differently: Take 7.5 mg by mouth as needed. )   . methotrexate 2.5 MG tablet Take 15 mg by mouth daily. 03/31/2017: Patient takes 6 tablets every Saturday  . pantoprazole (PROTONIX) 40 MG tablet TAKE 1 TABLET DAILY FOR    REFLUX (Patient taking differently: 40 mg 2 (two) times daily before a meal. )   . Zoster Vaccine Adjuvanted [facility]) injection Shingles vaccine series - inject IM as directed .   . [DISCONTINUED] amLODipine (NORVASC) 5 MG tablet Take 2 tablets (10 mg total) by mouth every evening.   . [DISCONTINUED] folic acid (FOLVITE) 1 MG tablet Take 2 tablets (2 mg total) by mouth daily.   . [EXPIRED] pneumococcal 13-valent conjugate vaccine (PREVNAR  13) SUSP injection Inject 0.5 mLs into the muscle once for 1 dose.   . [DISCONTINUED] amLODipine (NORVASC) 5 MG tablet Take 2 tablets (10 mg total) by mouth every evening.   . [DISCONTINUED] atorvastatin (LIPITOR) 10 MG tablet TAKE 1 TABLET AT BEDTIME   FOR HIGH CHOLESTEROL   . [DISCONTINUED] folic acid (FOLVITE) 1 MG tablet Take 2 tablets (2 mg total) by mouth daily.   . [DISCONTINUED] glimepiride (AMARYL) 4 MG tablet Take 1/2 tablet with breakfast and 1/2 tablet with dinner   . [DISCONTINUED] glucose blood (ONETOUCH VERIO) test strip Use as directed  For twice a day diag e11.65   . [DISCONTINUED] hydrALAZINE (APRESOLINE) 10 MG tablet Take 1  tablet (10 mg total) by mouth 3 (three) times daily.    No facility-administered encounter medications on file as of 10/23/2019.    Surgical History: Past Surgical History:  Procedure Laterality Date  . ABDOMINAL HYSTERECTOMY    . COLONOSCOPY WITH PROPOFOL N/A 10/25/2017   Procedure: COLONOSCOPY WITH PROPOFOL;  Surgeon: Lollie Sails, MD;  Location: [facility] ENDOSCOPY;  Service: Endoscopy;  Laterality: N/A;  . ESOPHAGOGASTRODUODENOSCOPY (EGD) WITH PROPOFOL N/A 10/25/2017   Procedure: ESOPHAGOGASTRODUODENOSCOPY (EGD) WITH PROPOFOL;  Surgeon: Lollie Sails, MD;  Location: [facility] ENDOSCOPY;  Service: Endoscopy;  Laterality: N/A;  . ESOPHAGOGASTRODUODENOSCOPY (EGD) WITH PROPOFOL N/A 03/04/2019   Procedure: ESOPHAGOGASTRODUODENOSCOPY (EGD) WITH PROPOFOL;  Surgeon: Lollie Sails, MD;  Location: [facility] ENDOSCOPY;  Service: Endoscopy;  Laterality: N/A;  . EUS N/A 01/17/2018   Procedure: ESOPHAGEAL ENDOSCOPIC ULTRASOUND (EUS) RADIAL;  Surgeon: Reita Cliche, MD;  Location: [facility] ENDOSCOPY;  Service: Gastroenterology;  Laterality: N/A;  . right side had fatty tissue taken off    . TONSILLECTOMY      Medical History: Past Medical History:  Diagnosis Date  . Anemia   . Arthritis   . Diabetes mellitus without complication ([place])   . GERD (gastroesophageal reflux disease)   . Heart murmur   . Hyperlipidemia   . Hypertension   . Rheumatoid arthritis ([place])     Family History: Family History  Problem Relation Age of Onset  . Hypertension Mother   . Diabetes Father   . Heart disease Brother 37       Open heart surgery (details unknown)  . Breast cancer Neg Hx       Review of Systems  Constitutional: Negative for activity change, chills, fatigue and unexpected weight change.  HENT: Negative for congestion, postnasal drip, rhinorrhea, sneezing and sore throat.   Respiratory: Negative for cough, chest tightness, shortness of breath and wheezing.   Cardiovascular: Negative for chest  pain and palpitations.  Gastrointestinal: Negative for abdominal pain, constipation, diarrhea, nausea and vomiting.  Endocrine: Negative for cold intolerance, heat intolerance, polydipsia and polyuria.       Blood sugars doing well   Musculoskeletal: Positive for arthralgias, joint swelling and myalgias. Negative for back pain and neck pain.       Sees rheumatology for RA.  Skin: Negative for rash.  Allergic/Immunologic: Negative for environmental allergies, food allergies and immunocompromised state.  Neurological: Negative for dizziness, tremors, numbness and headaches.  Hematological: Negative for adenopathy. Does not bruise/bleed easily.  Psychiatric/Behavioral: Negative for behavioral problems (Depression), sleep disturbance and suicidal ideas. The patient is nervous/anxious.      Today's Vitals   10/23/19 0908  BP: (!) 120/54  Pulse: 62  Resp: 16  Temp: (!) 97.3 F (36.3 C)  SpO2: 98%  Weight: 139 lb (63 kg)  Height:  5\' 2"  (1.575 m)   Body mass index is 25.42 kg/m.  Physical Exam Vitals and nursing note reviewed.  Constitutional:      General: She is not in acute distress.    Appearance: Normal appearance. She is well-developed. She is not diaphoretic.  HENT:     Head: Normocephalic and atraumatic.     Nose: Nose normal.     Mouth/Throat:     Pharynx: No oropharyngeal exudate.  Eyes:     Conjunctiva/sclera: Conjunctivae normal.     Pupils: Pupils are equal, round, and reactive to light.  Neck:     Thyroid: No thyromegaly.     Vascular: No carotid bruit or JVD.     Trachea: No tracheal deviation.  Cardiovascular:     Rate and Rhythm: Normal rate and regular rhythm.     Heart sounds: Murmur present. No friction rub. No gallop.   Pulmonary:     Effort: Pulmonary effort is normal. No respiratory distress.     Breath sounds: Normal breath sounds. No wheezing or rales.  Chest:     Chest wall: No tenderness.     Breasts:        Right: Normal. No swelling,  bleeding, inverted nipple, mass, nipple discharge, skin change or tenderness.        Left: Normal. No swelling, bleeding, inverted nipple, mass, nipple discharge, skin change or tenderness.  Abdominal:     General: Bowel sounds are normal.     Palpations: Abdomen is soft.     Tenderness: There is no abdominal tenderness.  Musculoskeletal:        General: Normal range of motion.     Cervical back: Normal range of motion and neck supple.     Comments: Generalized joint pain with arthritic nodules present, especially in the joints of the fingers and hands. She also has left knee swelling and tenderness. ROM and strength are mildly diminished due to pain.   Feet:     Right foot:     Protective Sensation: 10 sites tested. 10 sites sensed.     Left foot:     Protective Sensation: 10 sites tested. 10 sites sensed.  Lymphadenopathy:     Cervical: No cervical adenopathy.     Upper Body:     Right upper body: No axillary adenopathy.     Left upper body: No axillary adenopathy.  Skin:    General: Skin is warm and dry.  Neurological:     General: No focal deficit present.     Mental Status: She is alert and oriented to person, place, and time.     Cranial Nerves: No cranial nerve deficit.  Psychiatric:        Mood and Affect: Mood normal.        Behavior: Behavior normal.        Thought Content: Thought content normal.        Judgment: Judgment normal.    Depression screen [facility] 2/9 10/23/2019 08/14/2019 05/16/2019 01/20/2019 10/22/2018  Decreased Interest 0 0 0 0 0  Down, Depressed, Hopeless 1 0 0 0 0  PHQ - 2 Score 1 0 0 0 0    Functional Status Survey: Is the patient deaf or have difficulty hearing?: No Does the patient have difficulty seeing, even when wearing glasses/contacts?: Yes(cataract) Does the patient have difficulty concentrating, remembering, or making decisions?: Yes(sometimes) Does the patient have difficulty walking or climbing stairs?: Yes(arthritis in knees) Does the  patient have difficulty dressing or bathing?: No  Does the patient have difficulty doing errands alone such as visiting a doctor's office or shopping?: No  MMSE - [place] Exam 10/23/2019 10/22/2018 10/16/2017  Orientation to time 5 5 5   Orientation to Place 5 5 5   Registration 3 3 3   Attention/ Calculation 5 5 5   Recall 3 3 3   Language- name 2 objects 2 2 2   Language- repeat 1 1 1   Language- follow 3 step command 3 3 3   Language- read & follow direction 1 1 1   Write a sentence 1 1 1   Copy design 1 1 1   Total score 30 30 30     Fall Risk  10/23/2019 08/14/2019 05/16/2019 01/20/2019 10/22/2018  Falls in the past year? 0 0 0 0 0      LABS: Recent Results (from the past 2160 hour(s))  POCT HgB A1C     Status: Abnormal   Collection Time: 08/14/19 11:12 AM  Result Value Ref Range   Hemoglobin A1C 6.9 (A) 4.0 - 5.6 %   HbA1c POC (<> result, manual entry)     HbA1c, POC (prediabetic range)     HbA1c, POC (controlled diabetic range)    Urinalysis, Routine w reflex microscopic     Status: None   Collection Time: 10/23/19  9:11 AM  Result Value Ref Range   Specific Gravity, UA CANCELED     Comment: Test not performed. Insufficient specimen to perform or complete analysis.  Result canceled by the ancillary.    pH, UA CANCELED     Comment: Test not performed  Result canceled by the ancillary.    Protein,UA CANCELED     Comment: Test not performed  Result canceled by the ancillary.    Glucose, UA CANCELED     Comment: Test not performed  Result canceled by the ancillary.    Ketones, UA CANCELED     Comment: Test not performed  Result canceled by the ancillary.   POCT HgB A1C     Status: Abnormal   Collection Time: 10/23/19  9:34 AM  Result Value Ref Range   Hemoglobin A1C 6.8 (A) 4.0 - 5.6 %   HbA1c POC (<> result, manual entry)     HbA1c, POC (prediabetic range)     HbA1c, POC (controlled diabetic range)      Assessment/Plan: 1. Encounter for general adult  medical examination with abnormal findings Annual health maintenance exam today.  2. Type 2 diabetes mellitus with hyperglycemia, without long-term current use of insulin (HCC) - POCT HgB A1C 6.8 today. Continue diabetic medication as prescribed   3. Essential hypertension Blood pressure stable. Continue bp medication as prescribed  - amLODipine (NORVASC) 5 MG tablet; Take 2 tablets (10 mg total) by mouth every evening.  Dispense: 90 tablet; Refill: 0  4. Rheumatoid arthritis involving multiple sites with positive rheumatoid factor (HCC) Continue flolic acid as prescribed. Continue regular visits with rheumatology as prescribed.  - folic acid (FOLVITE) 1 MG tablet; Take 2 tablets (2 mg total) by mouth daily.  Dispense: 90 tablet; Refill: 1  5. Encounter for screening mammogram for malignant neoplasm of breast - MM DIGITAL SCREENING BILATERAL; Future  6. Need for vaccination against Streptococcus pneumoniae using pneumococcal conjugate vaccine 13 Prescription sent for Prevnar 13 to patient's pharmacy for administration.  - pneumococcal 13-valent conjugate vaccine (PREVNAR 13) SUSP injection; Inject 0.5 mLs into the muscle once for 1 dose.  Dispense: 0.5 mL; Refill: 0  7. Dysuria - Urinalysis, Routine w reflex microscopic  General  Counseling: [place] verbalizes understanding of the findings of todays visit and agrees with plan of treatment. I have discussed any further diagnostic evaluation that may be needed or ordered today. We also reviewed her medications today. she has been encouraged to call the office with any questions or concerns that should arise related to todays visit.    Counseling:  Diabetes Counseling:  1. Addition of ACE inh/ ARB'S for nephroprotection. Microalbumin is updated  2. Diabetic foot care, prevention of complications. Podiatry consult 3. Exercise and lose weight.  4. Diabetic eye examination, Diabetic eye exam is updated  5. Monitor blood sugar closlely.  nutrition counseling.  6. Sign and symptoms of hypoglycemia including shaking sweating,confusion and headaches.  This patient was seen by Leretha Pol FNP Collaboration with Dr Lavera Guise as a part of collaborative care agreement  Orders Placed This Encounter  Procedures  . MM DIGITAL SCREENING BILATERAL  . Urinalysis, Routine w reflex microscopic  . POCT HgB A1C    Meds ordered this encounter  Medications  . amLODipine (NORVASC) 5 MG tablet    Sig: Take 2 tablets (10 mg total) by mouth every evening.    Dispense:  90 tablet    Refill:  0    Patient taking 2 tablets. This is correct dosing.    Order Specific Question:   Supervising Provider    Answer:   Lavera Guise [id number]  . folic acid (FOLVITE) 1 MG tablet    Sig: Take 2 tablets (2 mg total) by mouth daily.    Dispense:  90 tablet    Refill:  1    Order Specific Question:   Supervising Provider    Answer:   Lavera Guise [id number]  . pneumococcal 13-valent conjugate vaccine (PREVNAR 13) SUSP injection    Sig: Inject 0.5 mLs into the muscle once for 1 dose.    Dispense:  0.5 mL    Refill:  0    Order Specific Question:   Supervising Provider    Answer:   Lavera Guise [id number]    Total time spent: 72 Minutes  Time spent includes review of chart, medications, test results, and follow up plan with the patient.     Lavera Guise, MD  Internal Medicine

## 2019-10-24 LAB — URINALYSIS, ROUTINE W REFLEX MICROSCOPIC

## 2019-10-26 DIAGNOSIS — R300 Dysuria: Secondary | ICD-10-CM | POA: Insufficient documentation

## 2019-10-26 DIAGNOSIS — Z23 Encounter for immunization: Secondary | ICD-10-CM | POA: Insufficient documentation

## 2019-10-26 DIAGNOSIS — Z0001 Encounter for general adult medical examination with abnormal findings: Secondary | ICD-10-CM | POA: Insufficient documentation

## 2019-10-31 ENCOUNTER — Other Ambulatory Visit
Admission: RE | Admit: 2019-10-31 | Discharge: 2019-10-31 | Disposition: A | Discharge status: Home / Self Care | Payer: Medicare HMO | Source: Ambulatory Visit | Attending: Internal Medicine | Admitting: Internal Medicine

## 2019-10-31 ENCOUNTER — Other Ambulatory Visit
Admission: RE | Admit: 2019-10-31 | Discharge: 2019-10-31 | Disposition: A | Discharge status: Home / Self Care | Payer: Medicare HMO | Source: Ambulatory Visit | Attending: Nurse Practitioner | Admitting: Nurse Practitioner

## 2019-10-31 DIAGNOSIS — I10 Essential (primary) hypertension: Secondary | ICD-10-CM | POA: Insufficient documentation

## 2019-10-31 DIAGNOSIS — E559 Vitamin D deficiency, unspecified: Secondary | ICD-10-CM | POA: Diagnosis not present

## 2019-10-31 DIAGNOSIS — E119 Type 2 diabetes mellitus without complications: Secondary | ICD-10-CM | POA: Insufficient documentation

## 2019-10-31 DIAGNOSIS — Z0000 Encounter for general adult medical examination without abnormal findings: Secondary | ICD-10-CM | POA: Insufficient documentation

## 2019-10-31 DIAGNOSIS — Z79899 Other long term (current) drug therapy: Secondary | ICD-10-CM

## 2019-10-31 LAB — CBC
HCT: 34.6 % — ABNORMAL LOW (ref 36.0–46.0)
Hemoglobin: 11.4 g/dL — ABNORMAL LOW (ref 12.0–15.0)
MCH: 28.5 pg (ref 26.0–34.0)
MCHC: 32.9 g/dL (ref 30.0–36.0)
MCV: 86.5 fL (ref 80.0–100.0)
Platelets: 163 10*3/uL (ref 150–400)
RBC: 4 MIL/uL (ref 3.87–5.11)
RDW: 15.2 % (ref 11.5–15.5)
WBC: 4.9 10*3/uL (ref 4.0–10.5)
nRBC: 0 % (ref 0.0–0.2)

## 2019-10-31 LAB — LIPID PANEL
Cholesterol: 207 mg/dL — ABNORMAL HIGH (ref 0–200)
HDL: 79 mg/dL (ref >40)
LDL Cholesterol: 118 mg/dL — ABNORMAL HIGH (ref 0–99)
Total CHOL/HDL Ratio: 2.6 RATIO
Triglycerides: 49 mg/dL (ref <150)
VLDL: 10 mg/dL (ref 0–40)

## 2019-10-31 LAB — COMPREHENSIVE METABOLIC PANEL
ALT: 15 U/L (ref 0–44)
AST: 22 U/L (ref 15–41)
Albumin: 4.1 g/dL (ref 3.5–5.0)
Alkaline Phosphatase: 78 U/L (ref 38–126)
Anion gap: 10 (ref 5–15)
BUN: 24 mg/dL — ABNORMAL HIGH (ref 8–23)
CO2: 29 mmol/L (ref 22–32)
Calcium: 9.1 mg/dL (ref 8.9–10.3)
Chloride: 99 mmol/L (ref 98–111)
Creatinine, Ser: 1.06 mg/dL — ABNORMAL HIGH (ref 0.44–1.00)
GFR calc Af Amer: 57 mL/min — ABNORMAL LOW (ref >60)
GFR calc non Af Amer: 50 mL/min — ABNORMAL LOW (ref >60)
Glucose, Bld: 151 mg/dL — ABNORMAL HIGH (ref 70–99)
Potassium: 3 mmol/L — ABNORMAL LOW (ref 3.5–5.1)
Sodium: 138 mmol/L (ref 135–145)
Total Bilirubin: 0.9 mg/dL (ref 0.3–1.2)
Total Protein: 7.3 g/dL (ref 6.5–8.1)

## 2019-10-31 LAB — VITAMIN D 25 HYDROXY (VIT D DEFICIENCY, FRACTURES): Vit D, 25-Hydroxy: 12.06 ng/mL — ABNORMAL LOW (ref 30–100)

## 2019-10-31 LAB — T4, FREE: Free T4: 1.01 ng/dL (ref 0.61–1.12)

## 2019-10-31 LAB — TSH: TSH: 1.856 u[IU]/mL (ref 0.350–4.500)

## 2019-10-31 NOTE — Progress Notes (Signed)
Waiting on all lab results.

## 2019-11-12 ENCOUNTER — Other Ambulatory Visit: Payer: Self-pay

## 2019-11-12 DIAGNOSIS — I10 Essential (primary) hypertension: Secondary | ICD-10-CM

## 2019-11-12 MED ORDER — AMLODIPINE BESYLATE 5 MG PO TABS
10.0000 mg | ORAL_TABLET | Freq: Every evening | ORAL | 0 refills | Status: DC
Start: 2019-11-12 — End: 2020-01-26

## 2019-12-02 DIAGNOSIS — H40003 Preglaucoma, unspecified, bilateral: Secondary | ICD-10-CM | POA: Diagnosis not present

## 2019-12-02 DIAGNOSIS — E113293 Type 2 diabetes mellitus with mild nonproliferative diabetic retinopathy without macular edema, bilateral: Secondary | ICD-10-CM | POA: Diagnosis not present

## 2019-12-16 DIAGNOSIS — H2512 Age-related nuclear cataract, left eye: Secondary | ICD-10-CM | POA: Diagnosis not present

## 2019-12-24 DIAGNOSIS — B351 Tinea unguium: Secondary | ICD-10-CM | POA: Diagnosis not present

## 2019-12-24 DIAGNOSIS — M79674 Pain in right toe(s): Secondary | ICD-10-CM | POA: Diagnosis not present

## 2019-12-24 DIAGNOSIS — M79675 Pain in left toe(s): Secondary | ICD-10-CM | POA: Diagnosis not present

## 2020-01-05 ENCOUNTER — Other Ambulatory Visit: Payer: Self-pay

## 2020-01-05 ENCOUNTER — Telehealth: Payer: Self-pay

## 2020-01-05 MED ORDER — ERGOCALCIFEROL 1.25 MG (50000 UT) PO CAPS: 50000.0000 [IU] | ORAL_CAPSULE | ORAL | 5 refills | Status: DC

## 2020-01-05 NOTE — Telephone Encounter (Signed)
Try to call pt  Phone no is not working about labs vitamin D is low take drisdol once a week and send pres to phar

## 2020-01-07 DIAGNOSIS — Z79899 Other long term (current) drug therapy: Secondary | ICD-10-CM | POA: Diagnosis not present

## 2020-01-07 DIAGNOSIS — M0579 Rheumatoid arthritis with rheumatoid factor of multiple sites without organ or systems involvement: Secondary | ICD-10-CM | POA: Diagnosis not present

## 2020-01-09 ENCOUNTER — Telehealth: Payer: Self-pay

## 2020-01-09 NOTE — Telephone Encounter (Signed)
Pt advised that vitamin D is low we send vitamin D

## 2020-01-09 NOTE — Telephone Encounter (Signed)
-----   Message from Lavera Guise, MD sent at 01/03/2020  6:52 AM EDT ----- Is she on drisdol?

## 2020-01-12 ENCOUNTER — Other Ambulatory Visit: Payer: Self-pay

## 2020-01-12 ENCOUNTER — Ambulatory Visit
Admission: RE | Admit: 2020-01-12 | Discharge: 2020-01-12 | Disposition: A | Discharge status: Home / Self Care | Payer: Medicare HMO | Source: Ambulatory Visit | Attending: Nurse Practitioner | Admitting: Nurse Practitioner

## 2020-01-12 DIAGNOSIS — Z1231 Encounter for screening mammogram for malignant neoplasm of breast: Secondary | ICD-10-CM

## 2020-01-12 NOTE — Progress Notes (Signed)
Negative mammogram

## 2020-01-25 NOTE — Progress Notes (Signed)
Cardiology Office Note    Date:  01/26/2020   ID:  [name], DOB 1940-08-24, MRN [id number]  PCP:  Becky Guise, MD  Cardiologist:  Nelva Bush, MD  Electrophysiologist:  None   Chief Complaint: Follow up  History of Present Illness:   [name] is a 79 y.o. female with history of valvular heart disease including mild to moderate mitral regurgitation, aortic insufficiency, tricuspid regurgitation, and mild aortic stenosis. She also has a history of DM2, HTN, HLD, RA, and anemia who presents for follow up of her valvular heart disease.   Prior echo, obtained through outside office, showed a hyperdynamic LVSF with an EF > 70%, moderate LVH, small left ventricle, normal RVSF and ventricular cavity size, mild AS, moderate AI, mild MR, moderate TR, mild pulmonary hypertension. She was since been followed at our [facility] office, noting stable exertional dyspnea and sporadic "twitches" of randomly occurring chest pain.  She was last seen in the office in 09/2019, and was doing well from a cardiac perspective with stable exertional dyspnea and occasional swelling of her hands without significant lower extremity edema. Her previously noted rare palpitations had improved. With suboptimally controlled BP, her losartan was titrated to 50 mg daily. She deferred ischemic testing.    She comes in doing relatively well today.  She notes since she has last been seen she continues to note stable exertional dyspnea and fatigue.  She indicates she can walk approximately 1.5 city blocks on flat ground prior to becoming fatigued.  She does note some progressive dyspnea if she is ambulating uphill.  She denies any chest pain with this.  She did have 1 brief episode of tachypalpitations approximately a month ago and has not had any since.  No dizziness, presyncope, or syncope.  No lower extremity swelling, abdominal distention, orthopnea, PND, or early satiety.  She is tolerating her medications  without issues.  Blood pressure remains well controlled.   Labs independently reviewed: 10/2019 - TSH normal, TC 207, TG 49, HDL, 79, LDL 118, potassium 3.0, BUN 24, SCr 1.06, albumin 4.1, AST/ALT normal, HGB 11.4, PLT 163 09/2019 - A1c 6.8  Past Medical History:  Diagnosis Date  . Anemia   . Arthritis   . Diabetes mellitus without complication ([place])   . GERD (gastroesophageal reflux disease)   . Heart murmur   . Hyperlipidemia   . Hypertension   . Rheumatoid arthritis [facility])     Past Surgical History:  Procedure Laterality Date  . ABDOMINAL HYSTERECTOMY    . COLONOSCOPY WITH PROPOFOL N/A 10/25/2017   Procedure: COLONOSCOPY WITH PROPOFOL;  Surgeon: Lollie Sails, MD;  Location: [facility] ENDOSCOPY;  Service: Endoscopy;  Laterality: N/A;  . ESOPHAGOGASTRODUODENOSCOPY (EGD) WITH PROPOFOL N/A 10/25/2017   Procedure: ESOPHAGOGASTRODUODENOSCOPY (EGD) WITH PROPOFOL;  Surgeon: Lollie Sails, MD;  Location: [facility] ENDOSCOPY;  Service: Endoscopy;  Laterality: N/A;  . ESOPHAGOGASTRODUODENOSCOPY (EGD) WITH PROPOFOL N/A 03/04/2019   Procedure: ESOPHAGOGASTRODUODENOSCOPY (EGD) WITH PROPOFOL;  Surgeon: Lollie Sails, MD;  Location: [facility] ENDOSCOPY;  Service: Endoscopy;  Laterality: N/A;  . EUS N/A 01/17/2018   Procedure: ESOPHAGEAL ENDOSCOPIC ULTRASOUND (EUS) RADIAL;  Surgeon: Reita Cliche, MD;  Location: [facility] ENDOSCOPY;  Service: Gastroenterology;  Laterality: N/A;  . right side had fatty tissue taken off    . TONSILLECTOMY      Current Medications: Current Meds  Medication Sig  . acetaminophen (TYLENOL) 500 MG tablet Take 500-1,000 mg by mouth every 6 (six) hours as needed.  Marland Kitchen  Alcohol Swabs (ALCOHOL PREP) PADS alcohol prep pads  . amLODipine (NORVASC) 5 MG tablet Take 2 tablets (10 mg total) by mouth every evening.  Marland Kitchen aspirin EC 81 MG tablet Take 81 mg by mouth daily.  Marland Kitchen atorvastatin (LIPITOR) 10 MG tablet TAKE 1 TABLET AT BEDTIME   FOR HIGH CHOLESTEROL  . ergocalciferol (DRISDOL)  1.25 MG (50000 UT) capsule Take 1 capsule (50,000 Units total) by mouth once a week.  . Ferrous Gluconate (IRON 27 PO) Take 27 mg by mouth daily.   . folic acid (FOLVITE) 1 MG tablet Take 2 tablets (2 mg total) by mouth daily.  Marland Kitchen glimepiride (AMARYL) 4 MG tablet Take 1/2 tablet with breakfast and 1/2 tablet with dinner  . glucose blood (ONETOUCH VERIO) test strip Use as directed  For twice a day diag e11.65  . hydrALAZINE (APRESOLINE) 10 MG tablet Take 10 mg by mouth 3 (three) times daily.  . hydrochlorothiazide (HYDRODIURIL) 25 MG tablet Take 1 tablet (25 mg total) by mouth daily.  Marland Kitchen losartan (COZAAR) 50 MG tablet Take 1 tablet (50 mg total) by mouth daily.  . meloxicam (MOBIC) 7.5 MG tablet Take 7.5 mg by mouth daily as needed for pain.  . methotrexate 2.5 MG tablet Take 15 mg by mouth once a week.   . Zoster Vaccine Adjuvanted [facility]) injection Shingles vaccine series - inject IM as directed .    Allergies:   Patient has no known allergies.   Social History   Socioeconomic History  . Marital status: Widowed    Spouse name: Not on file  . Number of children: Not on file  . Years of education: Not on file  . Highest education level: Not on file  Occupational History  . Not on file  Tobacco Use  . Smoking status: Never Smoker  . Smokeless tobacco: Never Used  Vaping Use  . Vaping Use: Never used  Substance and Sexual Activity  . Alcohol use: No  . Drug use: No  . Sexual activity: Not on file  Other Topics Concern  . Not on file  Social History Narrative  . Not on file   Social Determinants of Health   Financial Resource Strain:   . Difficulty of Paying Living Expenses: Not on file  Food Insecurity:   . Worried About Charity fundraiser in the Last Year: Not on file  . Ran Out of Food in the Last Year: Not on file  Transportation Needs:   . Lack of Transportation (Medical): Not on file  . Lack of Transportation (Non-Medical): Not on file  Physical Activity:   .  Days of Exercise per Week: Not on file  . Minutes of Exercise per Session: Not on file  Stress:   . Feeling of Stress : Not on file  Social Connections:   . Frequency of Communication with Friends and Family: Not on file  . Frequency of Social Gatherings with Friends and Family: Not on file  . Attends Religious Services: Not on file  . Active Member of Clubs or Organizations: Not on file  . Attends Archivist Meetings: Not on file  . Marital Status: Not on file     Family History:  The patient's family history includes Diabetes in her father; Heart disease (age of onset: 30) in her brother; Hypertension in her mother. There is no history of Breast cancer.  ROS:   Review of Systems  Constitutional: Positive for malaise/fatigue. Negative for chills, diaphoresis, fever and weight loss.  HENT: Negative for congestion.   Eyes: Negative for discharge and redness.  Respiratory: Positive for shortness of breath. Negative for cough, sputum production and wheezing.   Cardiovascular: Negative for chest pain, palpitations, orthopnea, claudication, leg swelling and PND.  Gastrointestinal: Negative for abdominal pain, heartburn, nausea and vomiting.  Musculoskeletal: Negative for falls and myalgias.  Skin: Negative for rash.  Neurological: Negative for dizziness, tingling, tremors, sensory change, speech change, focal weakness, loss of consciousness and weakness.  Endo/Heme/Allergies: Does not bruise/bleed easily.  Psychiatric/Behavioral: Negative for substance abuse. The patient is not nervous/anxious.   All other systems reviewed and are negative.    EKGs/Labs/Other Studies Reviewed:    Studies reviewed were summarized above. The additional studies were reviewed today:  2D echo 12/2018 (outside office): Hyperdynamic EF > 70%, moderate LVH with small left ventricle, mild aortic valve calcification with mild stenosis and moderate regurgitation, moderate mitral annular calcification  with mild regurgitation, mild tricuspid regurgitation, mild pulmonary hypertension  EKG:  EKG is ordered today.  The EKG ordered today demonstrates NSR with si,622bpan,lth, no acute ST-T changes  Recent Labs: 10/31/2019: ALT 15; BUN 24; Creatinine, Ser 1.06; Hemoglobin 11.4; Platelets 163; Potassium 3.0; Sodium 138; TSH 1.856  Recent Lipid Panel    Component Value Date/Time   CHOL 207 (H) 10/31/2019 0906   CHOL 228 (H) 07/25/2018 1624   TRIG 49 10/31/2019 0906   HDL 79 10/31/2019 0906   HDL 92 07/25/2018 1624   CHOLHDL 2.6 10/31/2019 0906   VLDL 10 10/31/2019 0906   LDLCALC 118 (H) 10/31/2019 0906   LDLCALC 120 (H) 07/25/2018 1624    PHYSICAL EXAM:    VS:  BP 120/60 (BP Location: Left Arm, Patient Position: Sitting, Cuff Size: Normal)   Pulse 62   Ht 5\' 2"  (1.575 m)   Wt 138 lb (62.6 kg)   SpO2 97%   BMI 25.24 kg/m   BMI: Body mass index is 25.24 kg/m.  Physical Exam Constitutional:      Appearance: She is well-developed.  HENT:     Head: Normocephalic and atraumatic.  Eyes:     General:        Right eye: No discharge.        Left eye: No discharge.  Neck:     Vascular: No JVD.  Cardiovascular:     Rate and Rhythm: Normal rate and regular rhythm.     Pulses: No midsystolic click and no opening snap.          Posterior tibial pulses are 2+ on the right side and 2+ on the left side.     Heart sounds: S1 normal and S2 normal. Heart sounds not distant. Murmur heard.  Harsh midsystolic murmur is present with a grade of 2/6 at the upper right sternal border radiating to the neck. High-pitched blowing holosystolic murmur of grade 2/6 is also present at the apex.  No friction rub.  Pulmonary:     Effort: Pulmonary effort is normal. No respiratory distress.     Breath sounds: Normal breath sounds. No decreased breath sounds, wheezing or rales.  Chest:     Chest wall: No tenderness.  Abdominal:     General: There is no distension.     Palpations: Abdomen is soft.      Tenderness: There is no abdominal tenderness.  Musculoskeletal:     Cervical back: Normal range of motion.  Skin:    General: Skin is warm and dry.     Nails: There is no  clubbing.  Neurological:     Mental Status: She is alert and oriented to person, place, and time.  Psychiatric:        Speech: Speech normal.        Behavior: Behavior normal.        Thought Content: Thought content normal.        Judgment: Judgment normal.     Wt Readings from Last 3 Encounters:  01/26/20 138 lb (62.6 kg)  10/23/19 139 lb (63 kg)  10/17/19 139 lb 2 oz (63.1 kg)     ASSESSMENT & PLAN:   1. Valvular heart disease with exertional dyspnea and fatigue: Overall, symptoms are mostly stable though she does note some progression of dyspnea when ambulating up an incline such as her driveway.  Given progression of symptoms we discussed imaging modalities and will proceed with an echo initially to trend her valvular heart disease and EF.  Based on these results we will plan for noninvasive versus invasive ischemic testing given exertional dyspnea and cardiac risk factors.  2. HTN: Blood pressure is well controlled.  Continue current medical therapy including amlodipine, hydralazine, HCTZ, and losartan.  Recent BMP demonstrated normal renal function.  3. HLD: LDL 118.  Remains on atorvastatin.  Disposition: F/u with Dr. Saunders Revel or an APP in 3 months.   Medication Adjustments/Labs and Tests Ordered: Current medicines are reviewed at length with the patient today.  Concerns regarding medicines are outlined above. Medication changes, Labs and Tests ordered today are summarized above and listed in the Patient Instructions accessible in Encounters.   Signed, [name], PA-C 01/26/2020 11:36 AM     [place] [address] [place], [place] [address] [phone]

## 2020-01-26 ENCOUNTER — Other Ambulatory Visit: Payer: Self-pay

## 2020-01-26 ENCOUNTER — Ambulatory Visit: Payer: Medicare HMO | Admitting: Physician Assistant

## 2020-01-26 ENCOUNTER — Encounter: Payer: Self-pay | Admitting: Physician Assistant

## 2020-01-26 VITALS — BP 120/60 | HR 62 | Ht 62.0 in | Wt 138.0 lb

## 2020-01-26 DIAGNOSIS — I38 Endocarditis, valve unspecified: Secondary | ICD-10-CM | POA: Diagnosis not present

## 2020-01-26 DIAGNOSIS — R5383 Other fatigue: Secondary | ICD-10-CM | POA: Diagnosis not present

## 2020-01-26 DIAGNOSIS — R0600 Dyspnea, unspecified: Secondary | ICD-10-CM

## 2020-01-26 DIAGNOSIS — E785 Hyperlipidemia, unspecified: Secondary | ICD-10-CM

## 2020-01-26 DIAGNOSIS — I10 Essential (primary) hypertension: Secondary | ICD-10-CM

## 2020-01-26 DIAGNOSIS — R0609 Other forms of dyspnea: Secondary | ICD-10-CM

## 2020-01-26 MED ORDER — HYDROCHLOROTHIAZIDE 25 MG PO TABS: 25.0000 mg | ORAL_TABLET | Freq: Every day | ORAL | 3 refills | Status: DC

## 2020-01-26 MED ORDER — ASPIRIN EC 81 MG PO TBEC: 81.0000 mg | DELAYED_RELEASE_TABLET | Freq: Every day | ORAL | 11 refills | Status: AC

## 2020-01-26 MED ORDER — HYDRALAZINE HCL 10 MG PO TABS: 10.0000 mg | ORAL_TABLET | Freq: Three times a day (TID) | ORAL | 11 refills | Status: DC

## 2020-01-26 MED ORDER — LOSARTAN POTASSIUM 50 MG PO TABS: 50.0000 mg | ORAL_TABLET | Freq: Every day | ORAL | 0 refills | Status: DC

## 2020-01-26 MED ORDER — AMLODIPINE BESYLATE 5 MG PO TABS: 10.0000 mg | ORAL_TABLET | Freq: Every evening | ORAL | 3 refills | Status: DC

## 2020-01-26 MED ORDER — ATORVASTATIN CALCIUM 10 MG PO TABS: ORAL_TABLET | ORAL | 3 refills | Status: DC

## 2020-01-26 NOTE — Patient Instructions (Signed)
Medication Instructions:  Your physician recommends that you continue on your current medications as directed. Please refer to the Current Medication list given to you today.  *If you need a refill on your cardiac medications before your next appointment, please call your pharmacy*   Lab Work: None ordered If you have labs (blood work) drawn today and your tests are completely normal, you will receive your results only by: Marland Kitchen MyChart Message (if you have MyChart) OR . A paper copy in the mail If you have any lab test that is abnormal or we need to change your treatment, we will call you to review the results.   Testing/Procedures: 1- Echo  Please return to [facility] on ______________ at _______________ AM/PM for an Echocardiogram. Your physician has requested that you have an echocardiogram. Echocardiography is a painless test that uses sound waves to create images of your heart. It provides your doctor with information about the size and shape of your heart and how well your heart's chambers and valves are working. This procedure takes approximately one hour. There are no restrictions for this procedure. Please note; depending on visual quality an IV may need to be placed.     Follow-Up: At [facility], you and your health needs are our priority.  As part of our continuing mission to provide you with exceptional heart care, we have created designated Provider Care Teams.  These Care Teams include your primary Cardiologist (physician) and Advanced Practice Providers (APPs -  Physician Assistants and Nurse Practitioners) who all work together to provide you with the care you need, when you need it.  We recommend signing up for the patient portal called "MyChart".  Sign up information is provided on this After Visit Summary.  MyChart is used to connect with patients for Virtual Visits (Telemedicine).  Patients are able to view lab/test results, encounter notes, upcoming  appointments, etc.  Non-urgent messages can be sent to your provider as well.   To learn more about what you can do with MyChart, go to [email or web address].    Your next appointment:   3 month(s)  The format for your next appointment:   In Person  Provider:    You may see Nelva Bush, MD or [name], PA-

## 2020-01-30 DIAGNOSIS — H2512 Age-related nuclear cataract, left eye: Secondary | ICD-10-CM | POA: Diagnosis not present

## 2020-01-30 DIAGNOSIS — I10 Essential (primary) hypertension: Secondary | ICD-10-CM | POA: Diagnosis not present

## 2020-02-04 ENCOUNTER — Encounter: Payer: Self-pay | Admitting: Ophthalmology

## 2020-02-10 IMAGING — RF DG UGI W/ SMALL BOWEL
12 of 16 series · 14 of 21 positions shown · non-contrast
Comparison: None.

CLINICAL DATA: Iron deficiency anemia. No bowel habit changes. No
abdominal pain. History of gastroesophageal reflux and diabetes.

EXAM:
UPPER GI SERIES WITH SMALL BOWEL FOLLOW-THROUGH using barium.
FLUOROSCOPY TIME:  Fluoroscopy Time:  1 minutes, 30 seconds
Radiation Exposure Index (if provided by the fluoroscopic device):
93 mGy
Number of Acquired Spot Images: 12 +1 video loop
TECHNIQUE: Combined double contrast and single contrast upper GI series using
effervescent crystals, thick barium, and thin barium. Subsequently,
serial images of the small bowel were obtained including spot views
of the terminal ileum.

[Series 1: t abdomen supine · 0.15mm/px · 1 of 1 slices shown]
[im 1/1]
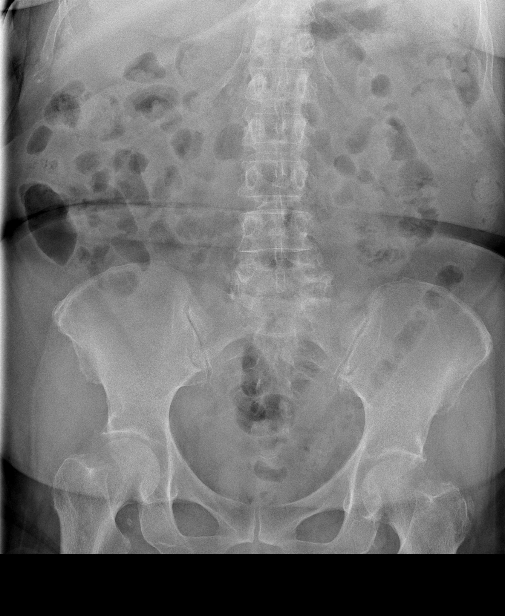

[Series 3: fluoro_barium swallow 2fps_bw · 0.18mm/px · 2 of 2 frames shown (1 of 9)]
[frame 1/2]
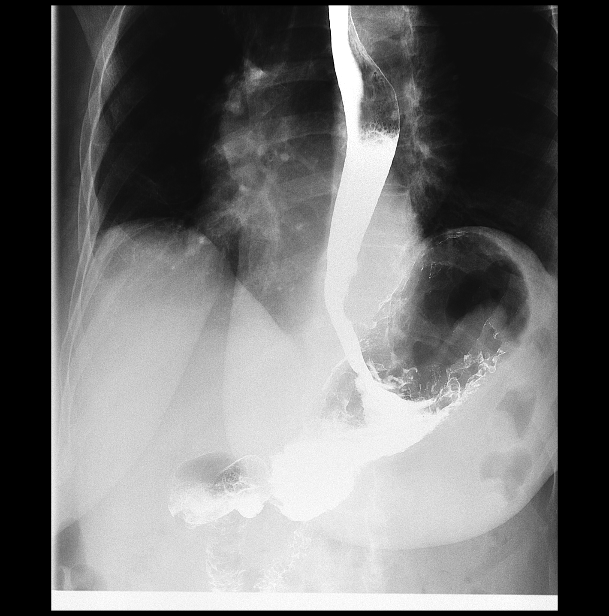
[frame 2/2]
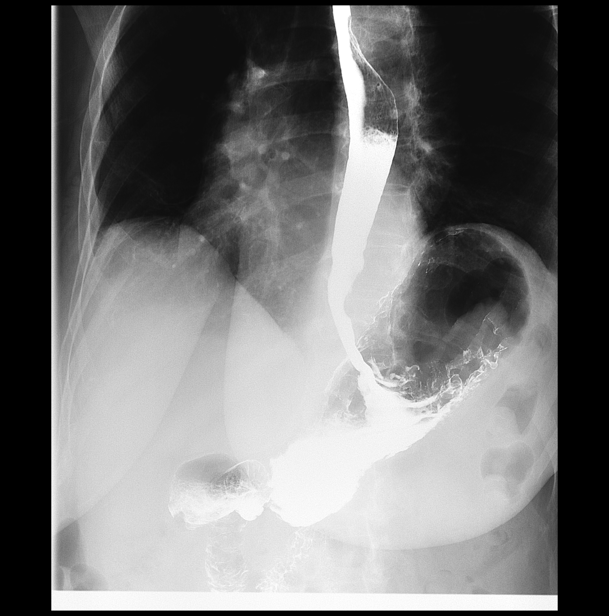

[Series 5: cp_standard · 0.27mm/px · 1 of 1 slices shown]
[im 1/1]
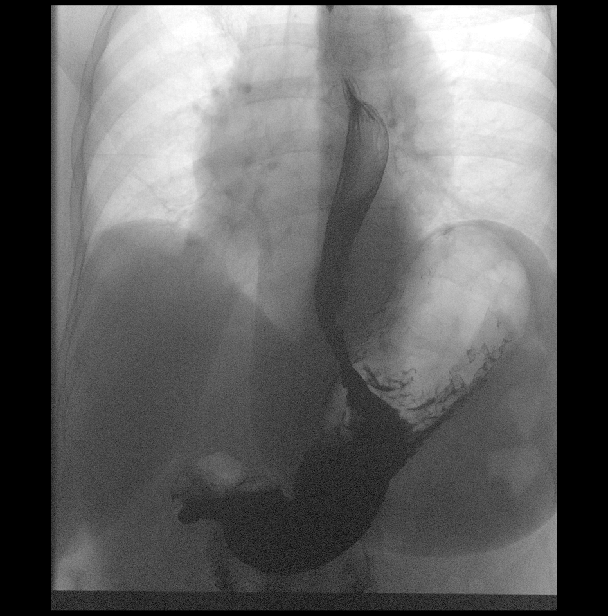

[Series 6: fluoro_barium swallow 2fps_bw · 0.18mm/px · 1 of 1 slices shown (2 of 9)]
[im 1/1]
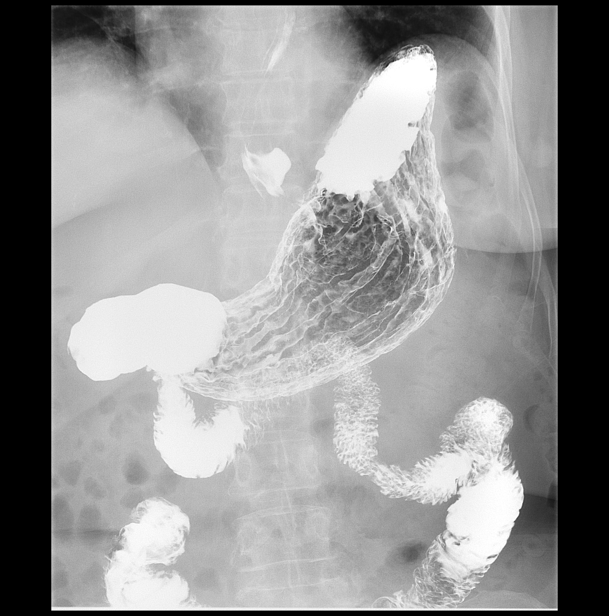

[Series 8: fluoro_barium swallow 2fps_bw · 0.18mm/px · 1 of 1 slices shown (3 of 9)]
[im 1/1]
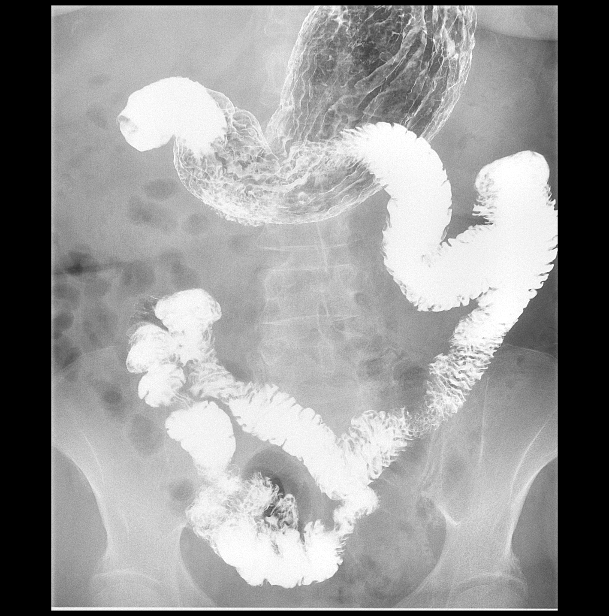

[Series 9: fluoro_barium swallow 2fps_bw · 0.18mm/px · 1 of 1 slices shown (4 of 9)]
[im 1/1]
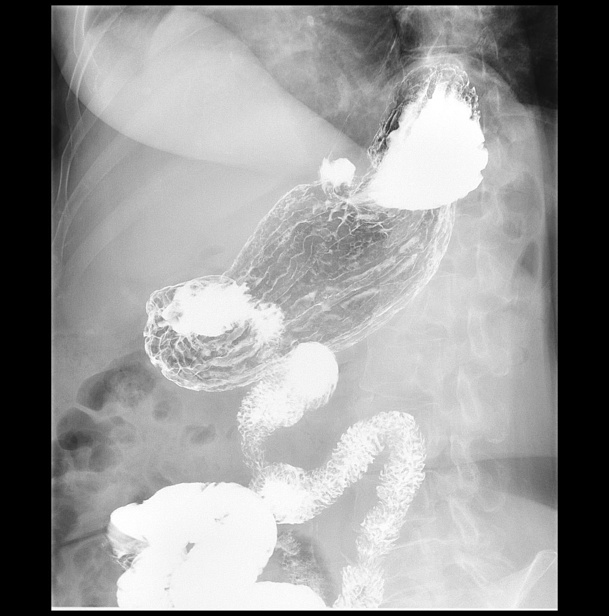

[Series 10: fluoro_barium swallow 2fps_bw · 0.18mm/px · 2 of 13 frames shown (5 of 9)]
[frame 2/13]
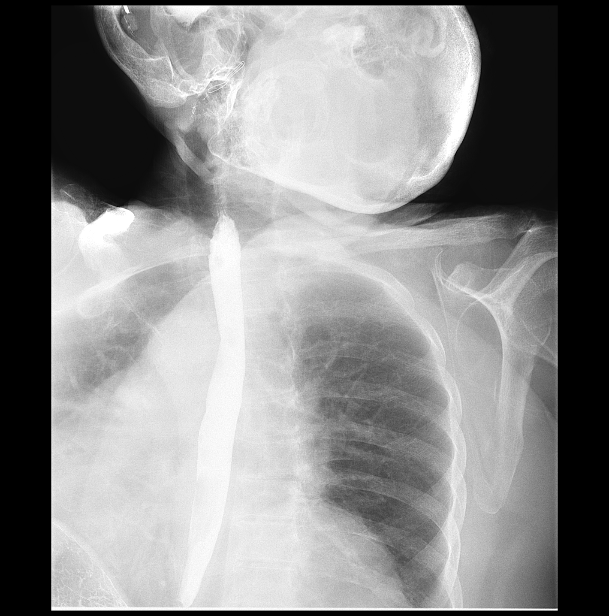
[frame 7/13]
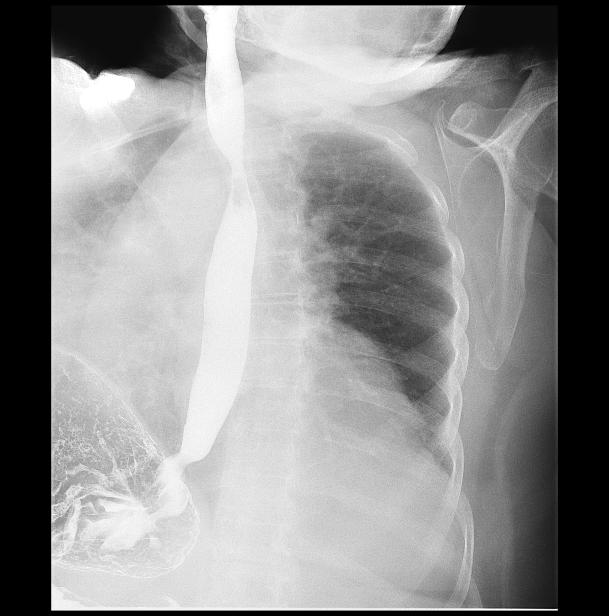

[Series 11: fluoro_barium swallow 2fps_bw · 0.18mm/px · 1 of 1 slices shown (6 of 9)]
[im 1/1]
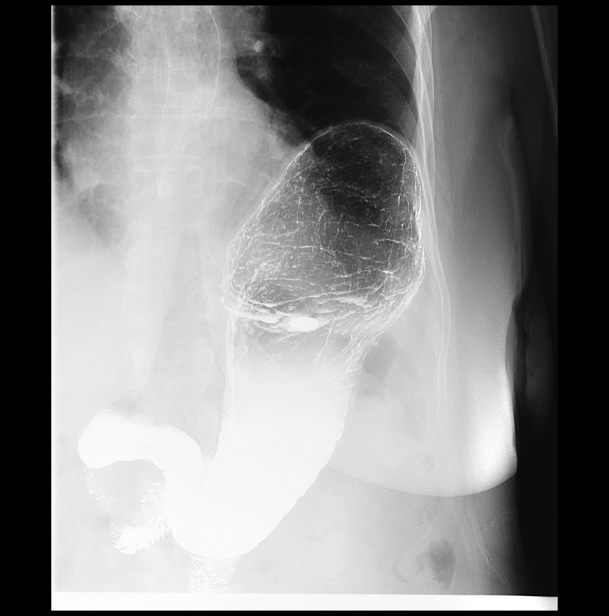

[Series 12: fluoro_barium swallow 2fps_bw · 0.17mm/px · 1 of 2 frames shown (7 of 9)]
[frame 1/2]
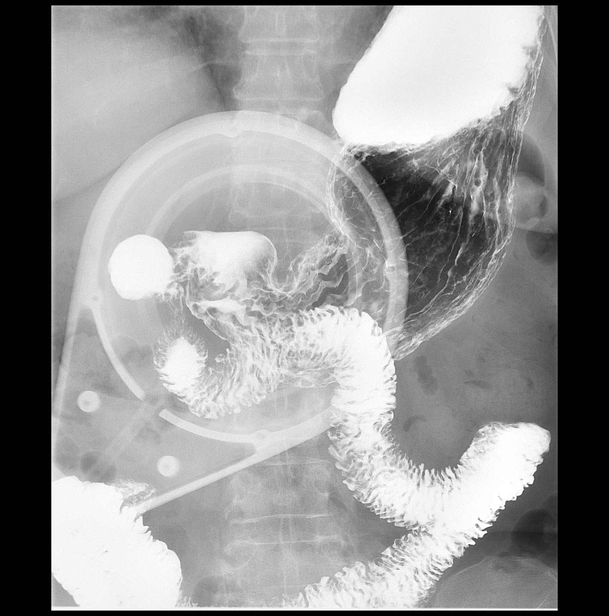

[Series 13: fluoro_barium swallow 2fps_bw · 0.17mm/px · 1 of 1 slices shown (8 of 9)]
[im 1/1]
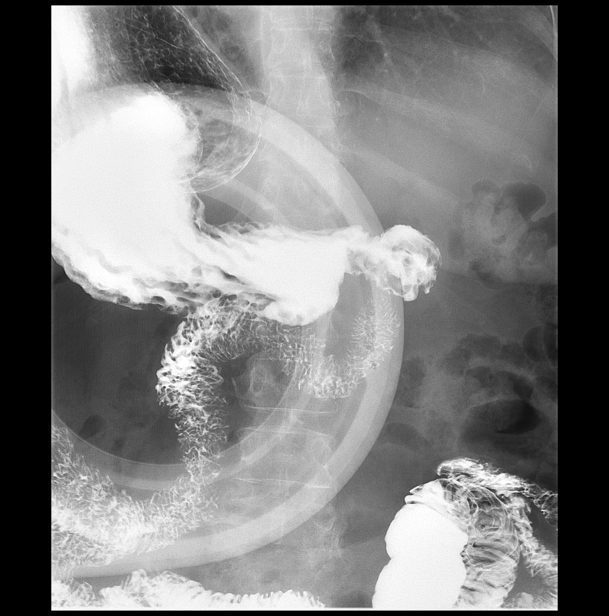

[Series 14: t abdomen barium ap · 0.15mm/px · 1 of 1 slices shown]
[im 1/1]
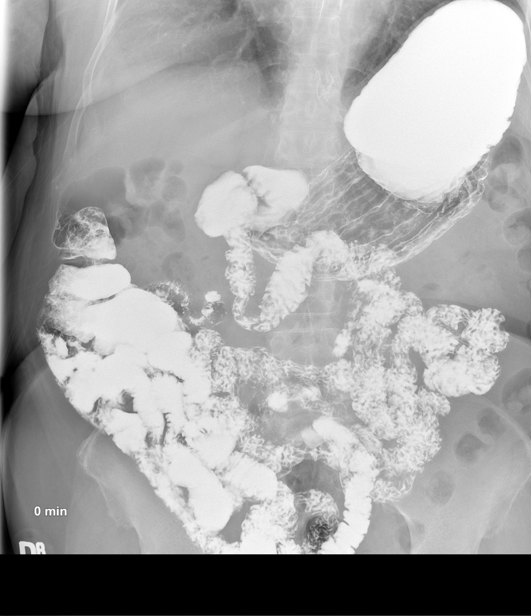

[Series 16: fluoro_barium swallow 2fps_bw · 0.18mm/px · 1 of 1 slices shown (9 of 9)]
[im 1/1]
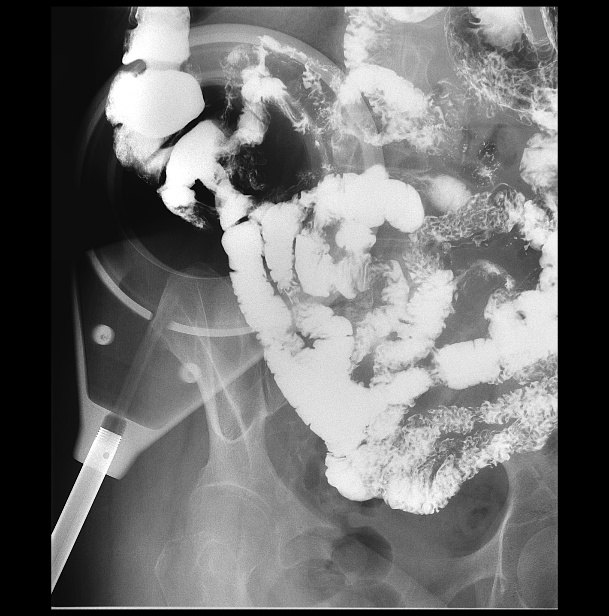

[14 of 21 positions shown; findings below may reference images not displayed]

FINDINGS: The scout radiographs reveal a normal stool and gas pattern within
the abdomen. The patient ingested thick and thin barium and the
gas-forming crystals without difficulty. The esophagus was normal in
a survey fashion. A small non reducible hiatal hernia was observed.
There is no evidence of gastroesophageal reflux. The barium tablet
passed promptly from the mouth to the stomach.

The stomach distended well. The mucosal fold pattern was normal.
There was no ulcer niche. The folds were pliable to palpation.
Gastric emptying was prompt. The duodenal bulb and C sweep were
normal in position. Over the course of approximately 30 minutes the
barium passed from the stomach to the right colon. The jejunum
demonstrated a normal feathery mucosal pattern. The ileum
demonstrated a normal mucosal pattern. The terminal ileum was
observed fluoroscopically with some difficulty due to overlying
contrast filled loops of ileum. There was no tenderness to palpation
over the abdomen.
IMPRESSION: Small reducible hiatal hernia. No evidence of esophagitis. No reflux
was observed during the procedure.

Normal appearance of the stomach, duodenum, jejunum, and ileum.

## 2020-02-16 ENCOUNTER — Other Ambulatory Visit: Payer: Self-pay

## 2020-02-16 ENCOUNTER — Other Ambulatory Visit
Admission: RE | Admit: 2020-02-16 | Discharge: 2020-02-16 | Disposition: A | Discharge status: Home / Self Care | Payer: Medicare HMO | Source: Ambulatory Visit | Attending: Ophthalmology | Admitting: Ophthalmology

## 2020-02-16 DIAGNOSIS — Z01812 Encounter for preprocedural laboratory examination: Secondary | ICD-10-CM | POA: Insufficient documentation

## 2020-02-16 DIAGNOSIS — Z20822 Contact with and (suspected) exposure to covid-19: Secondary | ICD-10-CM | POA: Insufficient documentation

## 2020-02-17 LAB — SARS CORONAVIRUS 2 (TAT 6-24 HRS): SARS Coronavirus 2: NEGATIVE

## 2020-02-17 NOTE — Discharge Instructions (Signed)

## 2020-02-23 ENCOUNTER — Other Ambulatory Visit: Payer: Self-pay

## 2020-02-23 ENCOUNTER — Ambulatory Visit: Payer: Medicare HMO | Admitting: Nurse Practitioner

## 2020-02-23 DIAGNOSIS — R69 Illness, unspecified: Secondary | ICD-10-CM | POA: Diagnosis not present

## 2020-02-23 MED ORDER — ONETOUCH VERIO VI STRP: ORAL_STRIP | 3 refills | Status: DC

## 2020-02-27 DIAGNOSIS — I10 Essential (primary) hypertension: Secondary | ICD-10-CM | POA: Diagnosis not present

## 2020-02-27 DIAGNOSIS — H2512 Age-related nuclear cataract, left eye: Secondary | ICD-10-CM | POA: Diagnosis not present

## 2020-03-03 ENCOUNTER — Encounter: Payer: Self-pay | Admitting: Ophthalmology

## 2020-03-03 ENCOUNTER — Other Ambulatory Visit: Payer: Self-pay

## 2020-03-04 ENCOUNTER — Ambulatory Visit (INDEPENDENT_AMBULATORY_CARE_PROVIDER_SITE_OTHER): Payer: Medicare HMO

## 2020-03-04 DIAGNOSIS — R0600 Dyspnea, unspecified: Secondary | ICD-10-CM | POA: Diagnosis not present

## 2020-03-04 DIAGNOSIS — R0609 Other forms of dyspnea: Secondary | ICD-10-CM

## 2020-03-05 LAB — ECHOCARDIOGRAM COMPLETE
AR max vel: 2.35 cm2
AV Area VTI: 2.49 cm2
AV Area mean vel: 2.54 cm2
AV Mean grad: 9 mmHg
AV Peak grad: 17.6 mmHg
Ao pk vel: 2.1 m/s
Area-P 1/2: 2.26 cm2
P 1/2 time: 693 msec
S' Lateral: 1.4 cm
Single Plane A4C EF: 51.6 %

## 2020-03-08 ENCOUNTER — Other Ambulatory Visit: Payer: Self-pay

## 2020-03-08 ENCOUNTER — Other Ambulatory Visit
Admission: RE | Admit: 2020-03-08 | Discharge: 2020-03-08 | Disposition: A | Discharge status: Home / Self Care | Payer: Medicare HMO | Source: Ambulatory Visit | Attending: Ophthalmology | Admitting: Ophthalmology

## 2020-03-08 DIAGNOSIS — Z20822 Contact with and (suspected) exposure to covid-19: Secondary | ICD-10-CM | POA: Insufficient documentation

## 2020-03-08 DIAGNOSIS — Z01812 Encounter for preprocedural laboratory examination: Secondary | ICD-10-CM | POA: Insufficient documentation

## 2020-03-08 LAB — SARS CORONAVIRUS 2 (TAT 6-24 HRS): SARS Coronavirus 2: NEGATIVE

## 2020-03-09 NOTE — Anesthesia Preprocedure Evaluation (Addendum)
Anesthesia Evaluation  Patient identified by MRN, date of birth, ID band Patient awake    Reviewed: Allergy & Precautions, NPO status , Patient's Chart, lab work & pertinent test results, reviewed documented beta blocker date and time   History of Anesthesia Complications Negative for: history of anesthetic complications  Airway Mallampati: III  TM Distance: >3 FB Neck ROM: Limited    Dental  (+) Upper Dentures, Lower Dentures   Pulmonary    breath sounds clear to auscultation       Cardiovascular hypertension, (-) angina(-) DOE + Valvular Problems/Murmurs  Rhythm:Regular Rate:Normal  HLD  TTE (03/04/20): LVEF 60-65%, mild LVH Grade I diastolic dysfunction Mild pHTN (RVSP 42 mm Hg) Mild MR, mild AI   Neuro/Psych PSYCHIATRIC DISORDERS Anxiety    GI/Hepatic GERD  ,  Endo/Other  diabetes  Renal/GU      Musculoskeletal  (+) Arthritis , Osteoarthritis and Rheumatoid disorders,    Abdominal   Peds  Hematology  (+) anemia ,   Anesthesia Other Findings   Reproductive/Obstetrics                            Anesthesia Physical Anesthesia Plan  ASA: III  Anesthesia Plan: MAC   Post-op Pain Management:    Induction: Intravenous  PONV Risk Score and Plan: 2 and TIVA, Midazolam and Treatment may vary due to age or medical condition  Airway Management Planned: Nasal Cannula  Additional Equipment:   Intra-op Plan:   Post-operative Plan:   Informed Consent: I have reviewed the patients History and Physical, chart, labs and discussed the procedure including the risks, benefits and alternatives for the proposed anesthesia with the patient or authorized representative who has indicated his/her understanding and acceptance.       Plan Discussed with: CRNA and Anesthesiologist  Anesthesia Plan Comments:         Anesthesia Quick Evaluation

## 2020-03-10 ENCOUNTER — Encounter
Admission: RE | Disposition: A | Discharge status: Home / Self Care | Payer: Self-pay | Source: Home / Self Care | Attending: Ophthalmology

## 2020-03-10 ENCOUNTER — Encounter: Payer: Self-pay | Admitting: Ophthalmology

## 2020-03-10 ENCOUNTER — Ambulatory Visit
Admission: RE | Admit: 2020-03-10 | Discharge: 2020-03-10 | Disposition: A | Discharge status: Home / Self Care | Payer: Medicare HMO | Attending: Ophthalmology | Admitting: Ophthalmology

## 2020-03-10 ENCOUNTER — Ambulatory Visit: Payer: Medicare HMO | Admitting: Anesthesiology

## 2020-03-10 ENCOUNTER — Other Ambulatory Visit: Payer: Self-pay

## 2020-03-10 DIAGNOSIS — R69 Illness, unspecified: Secondary | ICD-10-CM | POA: Diagnosis not present

## 2020-03-10 DIAGNOSIS — I10 Essential (primary) hypertension: Secondary | ICD-10-CM | POA: Insufficient documentation

## 2020-03-10 DIAGNOSIS — M069 Rheumatoid arthritis, unspecified: Secondary | ICD-10-CM | POA: Diagnosis not present

## 2020-03-10 DIAGNOSIS — F419 Anxiety disorder, unspecified: Secondary | ICD-10-CM | POA: Diagnosis not present

## 2020-03-10 DIAGNOSIS — R011 Cardiac murmur, unspecified: Secondary | ICD-10-CM | POA: Diagnosis not present

## 2020-03-10 DIAGNOSIS — H25812 Combined forms of age-related cataract, left eye: Secondary | ICD-10-CM | POA: Diagnosis not present

## 2020-03-10 DIAGNOSIS — H2512 Age-related nuclear cataract, left eye: Secondary | ICD-10-CM | POA: Diagnosis not present

## 2020-03-10 DIAGNOSIS — I272 Pulmonary hypertension, unspecified: Secondary | ICD-10-CM | POA: Diagnosis not present

## 2020-03-10 DIAGNOSIS — E7800 Pure hypercholesterolemia, unspecified: Secondary | ICD-10-CM | POA: Diagnosis not present

## 2020-03-10 DIAGNOSIS — H268 Other specified cataract: Secondary | ICD-10-CM | POA: Diagnosis not present

## 2020-03-10 DIAGNOSIS — Z9849 Cataract extraction status, unspecified eye: Secondary | ICD-10-CM | POA: Diagnosis not present

## 2020-03-10 DIAGNOSIS — M1990 Unspecified osteoarthritis, unspecified site: Secondary | ICD-10-CM | POA: Diagnosis not present

## 2020-03-10 DIAGNOSIS — E1136 Type 2 diabetes mellitus with diabetic cataract: Secondary | ICD-10-CM | POA: Insufficient documentation

## 2020-03-10 DIAGNOSIS — D649 Anemia, unspecified: Secondary | ICD-10-CM | POA: Insufficient documentation

## 2020-03-10 HISTORY — DX: Endocarditis, valve unspecified: I38

## 2020-03-10 HISTORY — DX: Presence of dental prosthetic device (complete) (partial): Z97.2

## 2020-03-10 HISTORY — PX: ANTERIOR VITRECTOMY: SHX1173

## 2020-03-10 HISTORY — PX: CATARACT EXTRACTION W/PHACO: SHX586

## 2020-03-10 LAB — GLUCOSE, CAPILLARY
Glucose-Capillary: 103 mg/dL — ABNORMAL HIGH (ref 70–99)
Glucose-Capillary: 138 mg/dL — ABNORMAL HIGH (ref 70–99)

## 2020-03-10 SURGERY — PHACOEMULSIFICATION, CATARACT, WITH IOL INSERTION
Anesthesia: Monitor Anesthesia Care | Site: Eye | Laterality: Left

## 2020-03-10 MED ORDER — NA HYALUR & NA CHOND-NA HYALUR 0.4-0.35 ML IO KIT
PACK | INTRAOCULAR | Status: DC | PRN
  Administered 2020-03-10: 1 mL via INTRAOCULAR

## 2020-03-10 MED ORDER — LABETALOL HCL 5 MG/ML IV SOLN
5.0000 mg | INTRAVENOUS | Status: DC | PRN
  Administered 2020-03-10: 5 mg via INTRAVENOUS

## 2020-03-10 MED ORDER — LIDOCAINE HCL (PF) 2 % IJ SOLN
INTRAOCULAR | Status: DC | PRN
  Administered 2020-03-10: 2 mL

## 2020-03-10 MED ORDER — MOXIFLOXACIN HCL 0.5 % OP SOLN
1.0000 [drp] | OPHTHALMIC | Status: DC | PRN
  Administered 2020-03-10 (×3): 1 [drp] via OPHTHALMIC

## 2020-03-10 MED ORDER — FENTANYL CITRATE (PF) 100 MCG/2ML IJ SOLN
INTRAMUSCULAR | Status: DC | PRN
  Administered 2020-03-10 (×2): 25 ug via INTRAVENOUS

## 2020-03-10 MED ORDER — BRIMONIDINE TARTRATE-TIMOLOL 0.2-0.5 % OP SOLN
OPHTHALMIC | Status: DC | PRN
  Administered 2020-03-10: 1 [drp] via OPHTHALMIC

## 2020-03-10 MED ORDER — MIDAZOLAM HCL 2 MG/2ML IJ SOLN
INTRAMUSCULAR | Status: DC | PRN
  Administered 2020-03-10: .5 mg via INTRAVENOUS

## 2020-03-10 MED ORDER — ACETAMINOPHEN 10 MG/ML IV SOLN: 1000.0000 mg | Freq: Once | INTRAVENOUS | Status: DC | PRN

## 2020-03-10 MED ORDER — EPINEPHRINE PF 1 MG/ML IJ SOLN
INTRAOCULAR | Status: DC | PRN
  Administered 2020-03-10: 107 mL via OPHTHALMIC

## 2020-03-10 MED ORDER — ARMC OPHTHALMIC DILATING DROPS
1.0000 "application " | OPHTHALMIC | Status: DC | PRN
  Administered 2020-03-10 (×3): 1 via OPHTHALMIC

## 2020-03-10 MED ORDER — TETRACAINE HCL 0.5 % OP SOLN
1.0000 [drp] | OPHTHALMIC | Status: DC | PRN
  Administered 2020-03-10 (×3): 1 [drp] via OPHTHALMIC

## 2020-03-10 MED ORDER — LABETALOL HCL 5 MG/ML IV SOLN
INTRAVENOUS | Status: DC | PRN
  Administered 2020-03-10: 2.5 mg via INTRAVENOUS

## 2020-03-10 MED ORDER — NEOMYCIN-POLYMYXIN-DEXAMETH 3.5-10000-0.1 OP OINT
TOPICAL_OINTMENT | OPHTHALMIC | Status: DC | PRN
  Administered 2020-03-10: 1 via OPHTHALMIC

## 2020-03-10 MED ORDER — ONDANSETRON HCL 4 MG/2ML IJ SOLN: 4.0000 mg | Freq: Once | INTRAMUSCULAR | Status: DC | PRN

## 2020-03-10 MED ORDER — CEFUROXIME OPHTHALMIC INJECTION 1 MG/0.1 ML
INJECTION | OPHTHALMIC | Status: DC | PRN
  Administered 2020-03-10: 0.1 mL via INTRACAMERAL

## 2020-03-10 SURGICAL SUPPLY — 26 items
CANNULA ANT/CHMB 27G (MISCELLANEOUS) ×1 IMPLANT
CANNULA ANT/CHMB 27GA (MISCELLANEOUS) ×2 IMPLANT
GLOVE SURG LX 7.5 STRW (GLOVE) ×1
GLOVE SURG LX STRL 7.5 STRW (GLOVE) ×1 IMPLANT
GLOVE SURG TRIUMPH 8.0 PF LTX (GLOVE) ×2 IMPLANT
GOWN STRL REUS W/ TWL LRG LVL3 (GOWN DISPOSABLE) ×2 IMPLANT
GOWN STRL REUS W/TWL LRG LVL3 (GOWN DISPOSABLE) ×4
LENS IOL DIOP 23.5 (Intraocular Lens) IMPLANT
LENS IOL TECNIS MONO 23.5 (Intraocular Lens) IMPLANT
MARKER SKIN DUAL TIP RULER LAB (MISCELLANEOUS) ×2 IMPLANT
NDL CAPSULORHEX 25GA (NEEDLE) ×1 IMPLANT
NDL FILTER BLUNT 18X1 1/2 (NEEDLE) ×2 IMPLANT
NEEDLE CAPSULORHEX 25GA (NEEDLE) ×2 IMPLANT
NEEDLE FILTER BLUNT 18X 1/2SAF (NEEDLE) ×2
NEEDLE FILTER BLUNT 18X1 1/2 (NEEDLE) ×2 IMPLANT
PACK CATARACT BRASINGTON (MISCELLANEOUS) ×2 IMPLANT
PACK EYE AFTER SURG (MISCELLANEOUS) ×2 IMPLANT
PACK OPTHALMIC (MISCELLANEOUS) ×2 IMPLANT
PACK VIT ANT 23G (MISCELLANEOUS) ×1 IMPLANT
SOLUTION OPHTHALMIC SALT (MISCELLANEOUS) ×2 IMPLANT
SUT ETHILON 10-0 CS-B-6CS-B-6 (SUTURE) ×2
SUTURE EHLN 10-0 CS-B-6CS-B-6 (SUTURE) IMPLANT
SYR 3ML LL SCALE MARK (SYRINGE) ×4 IMPLANT
SYR TB 1ML LUER SLIP (SYRINGE) ×2 IMPLANT
WATER STERILE IRR 250ML POUR (IV SOLUTION) ×2 IMPLANT
WIPE NON LINTING 3.25X3.25 (MISCELLANEOUS) ×2 IMPLANT

## 2020-03-10 NOTE — Transfer of Care (Signed)
Immediate Anesthesia Transfer of Care Note  Patient: [name]  Procedure(s) Performed: CATARACT EXTRACTION PHACO  (IOC) LEFT  DIABETIC 16.27  02:09.7  12.5% (Left Eye) ANTERIOR VITRECTOMY (Left Eye)  Patient Location: PACU  Anesthesia Type: MAC  Level of Consciousness: awake, alert  and patient cooperative  Airway and Oxygen Therapy: Patient Spontanous Breathing and Patient connected to supplemental oxygen  Post-op Assessment: Post-op Vital signs reviewed, Patient's Cardiovascular Status Stable, Respiratory Function Stable, Patent Airway and No signs of Nausea or vomiting  Post-op Vital Signs: Reviewed and stable  Complications: No complications documented.

## 2020-03-10 NOTE — Anesthesia Procedure Notes (Signed)
Date/Time: 03/10/2020 7:35 AM Performed by: Dionne Bucy, CRNA Oxygen Delivery Method: Nasal cannula Placement Confirmation: positive ETCO2

## 2020-03-10 NOTE — Anesthesia Postprocedure Evaluation (Signed)
Anesthesia Post Note  Patient: [name]  Procedure(s) Performed: CATARACT EXTRACTION PHACO  (IOC) LEFT  DIABETIC 16.27  02:09.7  12.5% (Left Eye) ANTERIOR VITRECTOMY (Left Eye)     Patient location during evaluation: PACU Anesthesia Type: MAC Level of consciousness: awake and alert Pain management: pain level controlled Vital Signs Assessment: post-procedure vital signs reviewed and stable Respiratory status: spontaneous breathing, nonlabored ventilation, respiratory function stable and patient connected to nasal cannula oxygen Cardiovascular status: stable and blood pressure returned to baseline Postop Assessment: no apparent nausea or vomiting Anesthetic complications: no   No complications documented.  Chamika Cunanan A  Anthony Tamburo

## 2020-03-10 NOTE — H&P (Signed)

## 2020-03-10 NOTE — Op Note (Addendum)
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 H25.12   POSTOPERATIVE DIAGNOSIS:    Nuclear sclerotic cataract left eye  with pseudoexfoliation of the lens capsule   PROCEDURE:  Phacoemusification with of the left eye  Ultrasound time: Procedure(s) with comments: CATARACT EXTRACTION PHACO  (IOC) LEFT  DIABETIC 16.27  02:09.7  12.5% (Left) - Diabetic - oral meds ANTERIOR VITRECTOMY (Left)  LENS:  NO IMPLANT was placed Implant Name Type Inv. Item Serial No. Manufacturer Lot No. LRB No. Used Action  LENS IOL DIOP 23.5 - [id number] Intraocular Lens LENS IOL DIOP 23.5 [id number] JOHNSON   Left 1 Wasted      SURGEON:  Wyonia Hough, MD   ANESTHESIA:  Topical with tetracaine drops and 2% Xylocaine jelly, augmented with 1% preservative-free intracameral lidocaine.    COMPLICATIONS: Zonular loss with anterior vitrectomy - no IOL was inserted.   DESCRIPTION OF PROCEDURE:  The patient was identified in the holding room and transported to the operating room and placed in the supine position under the operating microscope.  The left eye was identified as the operative eye and it was prepped and draped in the usual sterile ophthalmic fashion.   A 1 millimeter clear-corneal paracentesis was made at the 1:30 position.  0.5 ml of preservative-free 1% lidocaine was injected into the anterior chamber.  The anterior chamber was filled with Viscoat viscoelastic.  A 2.4 millimeter keratome was used to make a near-clear corneal incision at the 10:30 position.  .  A curvilinear capsulorrhexis was made with a cystotome and capsulorrhexis forceps.  Balanced salt solution was used to hydrodissect and hydrodelineate the nucleus.   Phacoemulsification was then used in stop and chop fashion to remove the lens nucleus and epinucleus. The entire lens bag complex was removed during  phacoemulsification due to total zonular loss.  Additional viscoelastic was placed into the anterior chamber and the instruments were removed. The eye was inspected and there was no remnant of anterior or posterior capsule.  A new paracentesis was added at the 9:00 position. Bimanual anterior vitrectomy was performed to remove the anterior vitreous.   In the absence of any capsule, the decision was made to not insert a lens, but to refer the patinet after surgery for a secondary IOL placement. Wounds were checked with Weck cells and noted to be free of incarcerated vitreous strands.  Wounds were again checked to ensure no vitreous was present.  Wounds were hydrated with balanced salt solution.  The anterior chamber was inflated to a physiologic pressure with balanced salt solution.  No wound leaks were noted. Cefuroxime 0.1 ml of a 10mg /ml solution was injected into the anterior chamber for a dose of 1 mg of intracameral antibiotic at the completion of the case.   Timolol and Brimonidine drops and maxitrol ointment were applied to the eye.  The patient was taken to the recovery room in stable condition.  [name] 03/10/2020, 8:15 AM

## 2020-03-11 ENCOUNTER — Encounter: Payer: Self-pay | Admitting: Ophthalmology

## 2020-03-29 ENCOUNTER — Ambulatory Visit
Admission: EM | Admit: 2020-03-29 | Discharge: 2020-03-29 | Disposition: A | Discharge status: Home / Self Care | Payer: Medicare HMO

## 2020-03-29 ENCOUNTER — Other Ambulatory Visit: Payer: Self-pay

## 2020-03-29 DIAGNOSIS — Z0189 Encounter for other specified special examinations: Secondary | ICD-10-CM

## 2020-03-29 DIAGNOSIS — Z01812 Encounter for preprocedural laboratory examination: Secondary | ICD-10-CM | POA: Diagnosis not present

## 2020-03-29 DIAGNOSIS — Z20822 Contact with and (suspected) exposure to covid-19: Secondary | ICD-10-CM | POA: Diagnosis not present

## 2020-03-29 NOTE — ED Triage Notes (Signed)
Patient came in for covid testing, upon further discussion patient was supposed to have an appt with pre-op for this at [facility] and came to the wrong location. Patient was provided a refund for Co-pay and charting updated to reflect leaving campus.

## 2020-03-30 ENCOUNTER — Ambulatory Visit: Payer: Medicare HMO | Admitting: Nurse Practitioner

## 2020-03-31 DIAGNOSIS — H2702 Aphakia, left eye: Secondary | ICD-10-CM | POA: Diagnosis not present

## 2020-03-31 DIAGNOSIS — I10 Essential (primary) hypertension: Secondary | ICD-10-CM | POA: Diagnosis not present

## 2020-03-31 DIAGNOSIS — E113392 Type 2 diabetes mellitus with moderate nonproliferative diabetic retinopathy without macular edema, left eye: Secondary | ICD-10-CM | POA: Diagnosis not present

## 2020-04-07 DIAGNOSIS — M0579 Rheumatoid arthritis with rheumatoid factor of multiple sites without organ or systems involvement: Secondary | ICD-10-CM | POA: Diagnosis not present

## 2020-04-07 DIAGNOSIS — Z79899 Other long term (current) drug therapy: Secondary | ICD-10-CM | POA: Diagnosis not present

## 2020-04-12 ENCOUNTER — Ambulatory Visit (INDEPENDENT_AMBULATORY_CARE_PROVIDER_SITE_OTHER): Payer: Medicare HMO | Admitting: Nurse Practitioner

## 2020-04-12 ENCOUNTER — Other Ambulatory Visit: Payer: Self-pay

## 2020-04-12 VITALS — BP 138/62 | HR 63 | Temp 97.6°F | Resp 16 | Ht 62.0 in | Wt 138.0 lb

## 2020-04-12 DIAGNOSIS — E1165 Type 2 diabetes mellitus with hyperglycemia: Secondary | ICD-10-CM

## 2020-04-12 DIAGNOSIS — M0579 Rheumatoid arthritis with rheumatoid factor of multiple sites without organ or systems involvement: Secondary | ICD-10-CM | POA: Diagnosis not present

## 2020-04-12 DIAGNOSIS — Z23 Encounter for immunization: Secondary | ICD-10-CM

## 2020-04-12 DIAGNOSIS — E559 Vitamin D deficiency, unspecified: Secondary | ICD-10-CM

## 2020-04-12 DIAGNOSIS — I38 Endocarditis, valve unspecified: Secondary | ICD-10-CM

## 2020-04-12 DIAGNOSIS — I10 Essential (primary) hypertension: Secondary | ICD-10-CM | POA: Diagnosis not present

## 2020-04-12 LAB — POCT GLYCOSYLATED HEMOGLOBIN (HGB A1C): Hemoglobin A1C: 6.1 % — AB (ref 4.0–5.6)

## 2020-04-12 MED ORDER — ERGOCALCIFEROL 1.25 MG (50000 UT) PO CAPS: 50000.0000 [IU] | ORAL_CAPSULE | ORAL | 5 refills | Status: DC

## 2020-04-12 NOTE — Progress Notes (Signed)
[facility] [place], [place] [address]  Internal MEDICINE  Office Visit Note  Patient Name: [name]  [id number]  [id number]  Date of Service: 05/09/2020  Chief Complaint  Patient presents with  . Follow-up  . Diabetes  . Gastroesophageal Reflux  . Hyperlipidemia  . Hypertension  . Quality Metric Gaps    flu,tetnaus, eye exam  . controlled substance form    reviewed with PT    The patient is here for follow up visit. She states that she had two surgeries on her left eye. Had cataracts removed, but then there was so much deteriorated that surgeon was unable to put the normal lens in her eye. Had to go to [facility] for new lens. She is also having some increased issue with arthritis. This is most severe in right knee. She sees her rheumatologist sometime tomorrow.  Blood sugars doing very well with HgbA1c at 6.1.  Blood pressure elevated today. She states that blood pressure was elevated last week when she had second procedure on her left eye last week.       Current Medication: Outpatient Encounter Medications as of 04/12/2020  Medication Sig  . acetaminophen (TYLENOL) 500 MG tablet Take 500-1,000 mg by mouth every 6 (six) hours as needed.  . Alcohol Swabs (ALCOHOL PREP) PADS alcohol prep pads  . amLODipine (NORVASC) 5 MG tablet Take 2 tablets (10 mg total) by mouth every evening.  Marland Kitchen aspirin EC 81 MG tablet Take 1 tablet (81 mg total) by mouth daily.  Marland Kitchen atorvastatin (LIPITOR) 10 MG tablet TAKE 1 TABLET AT BEDTIME   FOR HIGH CHOLESTEROL  . ergocalciferol (DRISDOL) 1.25 MG (50000 UT) capsule Take 1 capsule (50,000 Units total) by mouth once a week.  . Ferrous Gluconate (IRON 27 PO) Take 27 mg by mouth daily.   . folic acid (FOLVITE) 1 MG tablet Take 2 tablets (2 mg total) by mouth daily.  Marland Kitchen glimepiride (AMARYL) 4 MG tablet Take 1/2 tablet with breakfast and 1/2 tablet with dinner  . glucose blood (ONETOUCH VERIO) test strip Use as directed  For  twice a day diag e11.65  . hydrALAZINE (APRESOLINE) 10 MG tablet Take 1 tablet (10 mg total) by mouth 3 (three) times daily.  . methotrexate 2.5 MG tablet Take 15 mg by mouth once a week.   . pantoprazole (PROTONIX) 40 MG tablet TAKE 1 TABLET DAILY FOR    REFLUX (Patient taking differently: 40 mg 2 (two) times daily before a meal. )  . [DISCONTINUED] ergocalciferol (DRISDOL) 1.25 MG (50000 UT) capsule Take 1 capsule (50,000 Units total) by mouth once a week.  . [DISCONTINUED] hydrochlorothiazide (HYDRODIURIL) 25 MG tablet Take 1 tablet (25 mg total) by mouth daily.  . [DISCONTINUED] losartan (COZAAR) 50 MG tablet Take 1 tablet (50 mg total) by mouth daily.  . [DISCONTINUED] meloxicam (MOBIC) 7.5 MG tablet TAKE 1 TABLET EVERY MORNINGFOR ARTHRITIS (Patient not taking: Reported on 05/03/2020)  . [DISCONTINUED] meloxicam (MOBIC) 7.5 MG tablet Take 7.5 mg by mouth daily as needed for pain. (Patient not taking: Reported on 05/03/2020)  . [DISCONTINUED] Zoster Vaccine Adjuvanted [facility]) injection Shingles vaccine series - inject IM as directed .   No facility-administered encounter medications on file as of 04/12/2020.    Surgical History: Past Surgical History:  Procedure Laterality Date  . ABDOMINAL HYSTERECTOMY    . ANTERIOR VITRECTOMY Left 03/10/2020   Procedure: ANTERIOR VITRECTOMY;  Surgeon: Leandrew Koyanagi, MD;  Location: [place];  Service: Ophthalmology;  Laterality: Left;  . CATARACT EXTRACTION    . CATARACT EXTRACTION W/PHACO Left 03/10/2020   Procedure: CATARACT EXTRACTION PHACO  (IOC) LEFT  DIABETIC 16.27  02:09.7  12.5%;  Surgeon: Leandrew Koyanagi, MD;  Location: [place];  Service: Ophthalmology;  Laterality: Left;  Diabetic - oral meds  . COLONOSCOPY WITH PROPOFOL N/A 10/25/2017   Procedure: COLONOSCOPY WITH PROPOFOL;  Surgeon: Lollie Sails, MD;  Location: [facility] ENDOSCOPY;  Service: Endoscopy;  Laterality: N/A;  . ESOPHAGOGASTRODUODENOSCOPY (EGD)  WITH PROPOFOL N/A 10/25/2017   Procedure: ESOPHAGOGASTRODUODENOSCOPY (EGD) WITH PROPOFOL;  Surgeon: Lollie Sails, MD;  Location: [facility] ENDOSCOPY;  Service: Endoscopy;  Laterality: N/A;  . ESOPHAGOGASTRODUODENOSCOPY (EGD) WITH PROPOFOL N/A 03/04/2019   Procedure: ESOPHAGOGASTRODUODENOSCOPY (EGD) WITH PROPOFOL;  Surgeon: Lollie Sails, MD;  Location: [facility] ENDOSCOPY;  Service: Endoscopy;  Laterality: N/A;  . EUS N/A 01/17/2018   Procedure: ESOPHAGEAL ENDOSCOPIC ULTRASOUND (EUS) RADIAL;  Surgeon: Reita Cliche, MD;  Location: [facility] ENDOSCOPY;  Service: Gastroenterology;  Laterality: N/A;  . right side had fatty tissue taken off    . TONSILLECTOMY      Medical History: Past Medical History:  Diagnosis Date  . Anemia   . Arthritis   . Diabetes mellitus without complication ([place])   . GERD (gastroesophageal reflux disease)   . Heart murmur   . Hyperlipidemia   . Hypertension   . Leaky heart valve   . Rheumatoid arthritis ([place])   . Wears dentures     Family History: Family History  Problem Relation Age of Onset  . Hypertension Mother   . Diabetes Father   . Heart disease Brother 40       Open heart surgery (details unknown)  . Breast cancer Neg Hx     Social History   Socioeconomic History  . Marital status: Widowed    Spouse name: Not on file  . Number of children: Not on file  . Years of education: Not on file  . Highest education level: Not on file  Occupational History  . Not on file  Tobacco Use  . Smoking status: Never Smoker  . Smokeless tobacco: Never Used  Vaping Use  . Vaping Use: Never used  Substance and Sexual Activity  . Alcohol use: No  . Drug use: No  . Sexual activity: Not on file  Other Topics Concern  . Not on file  Social History Narrative  . Not on file   Social Determinants of Health   Financial Resource Strain: Not on file  Food Insecurity: Not on file  Transportation Needs: Not on file  Physical Activity: Not on file  Stress:  Not on file  Social Connections: Not on file  Intimate Partner Violence: Not on file      Review of Systems  Constitutional: Negative for activity change, chills, fatigue and unexpected weight change.  HENT: Negative for congestion, postnasal drip, rhinorrhea, sneezing and sore throat.   Respiratory: Negative for cough, chest tightness, shortness of breath and wheezing.   Cardiovascular: Negative for chest pain and palpitations.       Blood pressure elevated at start of visit. Improved to normal during the visit.  Gastrointestinal: Negative for abdominal pain, constipation, diarrhea, nausea and vomiting.  Endocrine: Negative for cold intolerance, heat intolerance, polydipsia and polyuria.       Blood sugars doing well   Musculoskeletal: Positive for arthralgias, joint swelling and myalgias. Negative for back pain and neck pain.       Sees rheumatology for  RA.  Skin: Negative for rash.  Allergic/Immunologic: Negative for environmental allergies, food allergies and immunocompromised state.  Neurological: Negative for dizziness, tremors, numbness and headaches.  Hematological: Negative for adenopathy. Does not bruise/bleed easily.  Psychiatric/Behavioral: Negative for behavioral problems (Depression), sleep disturbance and suicidal ideas. The patient is nervous/anxious.     Today's Vitals   04/12/20 1447  BP: 138/62  Pulse: 63  Resp: 16  Temp: 97.6 F (36.4 C)  SpO2: 97%  Weight: 138 lb (62.6 kg)  Height: 5\' 2"  (1.575 m)   Body mass index is 25.24 kg/m.  Physical Exam Vitals and nursing note reviewed.  Constitutional:      General: She is not in acute distress.    Appearance: Normal appearance. She is well-developed and well-nourished. She is not diaphoretic.  HENT:     Head: Normocephalic and atraumatic.     Nose: Nose normal.     Mouth/Throat:     Mouth: Oropharynx is clear and moist.     Pharynx: No oropharyngeal exudate.  Eyes:     Extraocular Movements: EOM  normal.     Pupils: Pupils are equal, round, and reactive to light.  Neck:     Thyroid: No thyromegaly.     Vascular: No JVD.     Trachea: No tracheal deviation.  Cardiovascular:     Rate and Rhythm: Normal rate and regular rhythm.     Heart sounds: Normal heart sounds. No murmur heard. No friction rub. No gallop.   Pulmonary:     Effort: Pulmonary effort is normal. No respiratory distress.     Breath sounds: Normal breath sounds. No wheezing or rales.  Chest:     Chest wall: No tenderness.  Abdominal:     Palpations: Abdomen is soft.  Musculoskeletal:        General: Normal range of motion.     Cervical back: Normal range of motion and neck supple.  Lymphadenopathy:     Cervical: No cervical adenopathy.  Skin:    General: Skin is warm and dry.  Neurological:     General: No focal deficit present.     Mental Status: She is alert and oriented to person, place, and time.     Cranial Nerves: No cranial nerve deficit.  Psychiatric:        Mood and Affect: Mood and affect and mood normal.        Behavior: Behavior normal.        Thought Content: Thought content normal.        Judgment: Judgment normal.    Assessment/Plan: 1. Uncontrolled type 2 diabetes mellitus with hyperglycemia (HCC) - POCT HgB A1C 6.1 today. Continue diabetic medication as prescribed. Monitor closely  2. Essential hypertension Stable. Continue bp medication as prescribed   3. Rheumatoid arthritis involving multiple sites with positive rheumatoid factor ([place]) Continue regular visits with rheumatology as schedled.   4. Vitamin D deficiency Vitamin d weekly. - ergocalciferol (DRISDOL) 1.25 MG (50000 UT) capsule; Take 1 capsule (50,000 Units total) by mouth once a week.  Dispense: 4 capsule; Refill: 5  5. Valvular heart disease Cardiology visits as scheduled   6. Needs flu shot Flu vaccine administered in the office today. - Flu Vaccine MDCK QUAD PF  General Counseling: Jolean verbalizes  understanding of the findings of todays visit and agrees with plan of treatment. I have discussed any further diagnostic evaluation that may be needed or ordered today. We also reviewed her medications today. she has been encouraged to  call the office with any questions or concerns that should arise related to todays visit.  Hypertension Counseling:   The following hypertensive lifestyle modification were recommended and discussed:  1. Limiting alcohol intake to less than 1 oz/day of ethanol:(24 oz of beer or 8 oz of wine or 2 oz of 100-proof whiskey). 2. Take baby ASA 81 mg daily. 3. Importance of regular aerobic exercise and losing weight. 4. Reduce dietary saturated fat and cholesterol intake for overall cardiovascular health. 5. Maintaining adequate dietary potassium, calcium, and magnesium intake. 6. Regular monitoring of the blood pressure. 7. Reduce sodium intake to less than 100 mmol/day (less than 2.3 gm of sodium or less than 6 gm of sodium choride)   This patient was seen by [place] with Dr Lavera Guise as a part of collaborative care agreement  Orders Placed This Encounter  Procedures  . Flu Vaccine MDCK QUAD PF  . POCT HgB A1C    Meds ordered this encounter  Medications  . ergocalciferol (DRISDOL) 1.25 MG (50000 UT) capsule    Sig: Take 1 capsule (50,000 Units total) by mouth once a week.    Dispense:  4 capsule    Refill:  5    Order Specific Question:   Supervising Provider    Answer:   Lavera Guise [[id number]]    Total time spent: 30 Minutes   Time spent includes review of chart, medications, test results, and follow up plan with the patient.      Dr Lavera Guise Internal medicine

## 2020-04-13 DIAGNOSIS — Z79899 Other long term (current) drug therapy: Secondary | ICD-10-CM | POA: Diagnosis not present

## 2020-04-13 DIAGNOSIS — E119 Type 2 diabetes mellitus without complications: Secondary | ICD-10-CM | POA: Diagnosis not present

## 2020-04-13 DIAGNOSIS — M25561 Pain in right knee: Secondary | ICD-10-CM | POA: Diagnosis not present

## 2020-04-13 DIAGNOSIS — M0579 Rheumatoid arthritis with rheumatoid factor of multiple sites without organ or systems involvement: Secondary | ICD-10-CM | POA: Diagnosis not present

## 2020-05-03 ENCOUNTER — Other Ambulatory Visit: Payer: Self-pay

## 2020-05-03 ENCOUNTER — Ambulatory Visit: Payer: Medicare HMO | Admitting: Internal Medicine

## 2020-05-03 ENCOUNTER — Encounter: Payer: Self-pay | Admitting: Internal Medicine

## 2020-05-03 VITALS — BP 168/72 | HR 68 | Ht 62.0 in | Wt 141.0 lb

## 2020-05-03 DIAGNOSIS — I38 Endocarditis, valve unspecified: Secondary | ICD-10-CM | POA: Diagnosis not present

## 2020-05-03 DIAGNOSIS — E785 Hyperlipidemia, unspecified: Secondary | ICD-10-CM | POA: Diagnosis not present

## 2020-05-03 DIAGNOSIS — Z79899 Other long term (current) drug therapy: Secondary | ICD-10-CM

## 2020-05-03 DIAGNOSIS — I10 Essential (primary) hypertension: Secondary | ICD-10-CM | POA: Diagnosis not present

## 2020-05-03 MED ORDER — LOSARTAN POTASSIUM 50 MG PO TABS: 50.0000 mg | ORAL_TABLET | Freq: Every day | ORAL | 3 refills | Status: DC

## 2020-05-03 NOTE — Patient Instructions (Signed)
Medication Instructions:  Your physician has recommended you make the following change in your medication:  1- INCREASE Losartan to 50 mg by mouth once a day.  *If you need a refill on your cardiac medications before your next appointment, please call your pharmacy*  Lab Work: Your physician recommends that you return for lab work in: 2 weeks in the [facility] about 1 hour prior to your appointment.  BMET - So that we will have the results at appointment. - Please go to the [facility]. You will check in at the front desk to the right as you walk into the atrium. Valet Parking is offered if needed. - No appointment needed. You may go any day between 7 am and 6 pm.  If you have labs (blood work) drawn today and your tests are completely normal, you will receive your results only by: Marland Kitchen MyChart Message (if you have MyChart) OR . A paper copy in the mail If you have any lab test that is abnormal or we need to change your treatment, we will call you to review the results.  Testing/Procedures: none  Follow-Up: At [facility], you and your health needs are our priority.  As part of our continuing mission to provide you with exceptional heart care, we have created designated Provider Care Teams.  These Care Teams include your primary Cardiologist (physician) and Advanced Practice Providers (APPs -  Physician Assistants and Nurse Practitioners) who all work together to provide you with the care you need, when you need it.  We recommend signing up for the patient portal called "MyChart".  Sign up information is provided on this After Visit Summary.  MyChart is used to connect with patients for Virtual Visits (Telemedicine).  Patients are able to view lab/test results, encounter notes, upcoming appointments, etc.  Non-urgent messages can be sent to your provider as well.   To learn more about what you can do with MyChart, go to [email or web address].    Your next appointment:   2 week(s)  with an APP   The format for your next appointment:   In Person  Provider:   You may see one of the following Advanced Practice Providers on your designated Care Team:    Murray Hodgkins, NP  [name], PA-C  Marrianne Mood, PA-C  Cadence [place], [place]  Laurann [place], NP

## 2020-05-03 NOTE — Progress Notes (Signed)
Follow-up Outpatient Visit Date: 05/03/2020  Primary Care Provider: Lavera Guise, MD [address] [place] [address]  Chief Complaint: Follow-up shortness of breath  HPI:  [name] is a 79 y.o. female with history of valvular heart disease (mild to moderate MR, AI, and TR as well as mildAS), hypertension, hyperlipidemia, type 2 diabetes mellitus, rheumatoid arthritis, and anemia, who presents for follow-up of valvular heart disease.  She was last seen in our office in late August by [name], PA.  She reported stable dyspnea on exertion at that time.  Echo in 02/2020 showed normal LVEF with mild LVH and grade 1 diastolic dysfunction.  There was mild to moderate pulmonary hypertension as well as mild mitral and aortic regurgitation.  Ischemia evaluation was previously declined by [name].  Today, [name] reports that she is doing relatively well, though she continues to recover from recent eye surgery.  She denies chest pain and shortness of breath.  She has occasional brief palpitations when she "rushes" without associated symptoms.  She recently underwent arthrocentesis of her right knee with improvement in swelling and pain.  She continues to have arthritis involving multiple joints, though this is tolerable.  Home blood pressures are occasionally elevated, though she notes that she has not taken her medications yet today.  HCTZ appears to have been discontinued and losartan dose reduced for unclear reasons.  --------------------------------------------------------------------------------------------------  Cardiovascular History & Procedures: Cardiovascular Problems:  Mixed valvular heart disease  Risk Factors:  Hypertension, diabetes mellitus, and age > 13  Cath/PCI:  None  CV Surgery:  None  EP Procedures and Devices:  None  Non-Invasive Evaluation(s):  TTE (01/24/2019): Small left ventricle with moderate LVH. Hyperdynamic contraction (LVEF  greater than 70%). Mild aortic valve calcification with mild stenosis and moderate regurgitation. Moderate mitral annular calcification with mild regurgitation. Mild tricuspid regurgitation. Mild pulmonary hypertension.  Recent CV Pertinent Labs: Lab Results  Component Value Date   CHOL 207 (H) 10/31/2019   CHOL 228 (H) 07/25/2018   HDL 79 10/31/2019   HDL 92 07/25/2018   LDLCALC 118 (H) 10/31/2019   LDLCALC 120 (H) 07/25/2018   TRIG 49 10/31/2019   CHOLHDL 2.6 10/31/2019   K 3.0 (L) 10/31/2019   BUN 24 (H) 10/31/2019   BUN 21 06/11/2019   CREATININE 1.06 (H) 10/31/2019    Past medical and surgical history were reviewed and updated in EPIC.  Current Meds  Medication Sig  . acetaminophen (TYLENOL) 500 MG tablet Take 500-1,000 mg by mouth every 6 (six) hours as needed.  . Alcohol Swabs (ALCOHOL PREP) PADS alcohol prep pads  . amLODipine (NORVASC) 5 MG tablet Take 2 tablets (10 mg total) by mouth every evening.  Marland Kitchen aspirin EC 81 MG tablet Take 1 tablet (81 mg total) by mouth daily.  Marland Kitchen atorvastatin (LIPITOR) 10 MG tablet TAKE 1 TABLET AT BEDTIME   FOR HIGH CHOLESTEROL  . ergocalciferol (DRISDOL) 1.25 MG (50000 UT) capsule Take 1 capsule (50,000 Units total) by mouth once a week.  . Ferrous Gluconate (IRON 27 PO) Take 27 mg by mouth daily.   . folic acid (FOLVITE) 1 MG tablet Take 2 tablets (2 mg total) by mouth daily.  Marland Kitchen glimepiride (AMARYL) 4 MG tablet Take 1/2 tablet with breakfast and 1/2 tablet with dinner  . glucose blood (ONETOUCH VERIO) test strip Use as directed  For twice a day diag e11.65  . hydrALAZINE (APRESOLINE) 10 MG tablet Take 1 tablet (10 mg total) by mouth 3 (  three) times daily.  . methotrexate 2.5 MG tablet Take 15 mg by mouth once a week.   . neomycin-polymyxin b-dexamethasone (MAXITROL) 3.5-10000-0.1 OINT SMARTSIG:Application Left Eye 4 Times Daily PRN  . ofloxacin (OCUFLOX) 0.3 % ophthalmic solution Place 1 drop into the left eye 4 (four) times daily.  .  pantoprazole (PROTONIX) 40 MG tablet TAKE 1 TABLET DAILY FOR    REFLUX (Patient taking differently: 40 mg 2 (two) times daily before a meal. )  . prednisoLONE acetate (PRED FORTE) 1 % ophthalmic suspension Administer 1 drop into the left eye four (4) times a day.  . [DISCONTINUED] losartan (COZAAR) 25 MG tablet Take 25 mg by mouth daily.    Allergies: Patient has no known allergies.  Social History   Tobacco Use  . Smoking status: Never Smoker  . Smokeless tobacco: Never Used  Vaping Use  . Vaping Use: Never used  Substance Use Topics  . Alcohol use: No  . Drug use: No    Family History  Problem Relation Age of Onset  . Hypertension Mother   . Diabetes Father   . Heart disease Brother 51       Open heart surgery (details unknown)  . Breast cancer Neg Hx     Review of Systems: A 12-system review of systems was performed and was negative except as noted in the HPI.  --------------------------------------------------------------------------------------------------  Physical Exam: BP (!) 168/72 (BP Location: Left Arm, Patient Position: Sitting, Cuff Size: Normal)   Pulse 68   Ht 5\' 2"  (1.575 m)   Wt 141 lb (64 kg)   SpO2 97%   BMI 25.79 kg/m   General: NAD. Neck: No JVD or HJR. Lungs: Clear to auscultation bilaterally without wheezes or crackles. Heart: Regular rate and rhythm with 3/6 systolic murmur.  No rubs or gallops. Abdomen: Soft, nontender, nondistended. Extremities: No lower extremity edema.   Lab Results  Component Value Date   WBC 4.9 10/31/2019   HGB 11.4 (L) 10/31/2019   HCT 34.6 (L) 10/31/2019   MCV 86.5 10/31/2019   PLT 163 10/31/2019    Lab Results  Component Value Date   NA 138 10/31/2019   K 3.0 (L) 10/31/2019   CL 99 10/31/2019   CO2 29 10/31/2019   BUN 24 (H) 10/31/2019   CREATININE 1.06 (H) 10/31/2019   GLUCOSE 151 (H) 10/31/2019   ALT 15 10/31/2019    Lab Results  Component Value Date   CHOL 207 (H) 10/31/2019   HDL 79  10/31/2019   LDLCALC 118 (H) 10/31/2019   TRIG 49 10/31/2019   CHOLHDL 2.6 10/31/2019    --------------------------------------------------------------------------------------------------  ASSESSMENT AND PLAN: Valvular heart disease: Overall, [name] reports feeling better with less exertional dyspnea and fatigue than at prior visits.  Recent echocardiogram showed relatively stable valvular disease.  We readdressed ischemia testing but have agreed to defer this given minimal symptoms.  Hypertension: Blood pressure poorly controlled today.  Notably, HCTZ was discontinued and losartan decreased since her last visit with [place] for unclear reasons.  I have recommended increasing losartan to 50 mg daily.  Long-term, we also would like to get her off hydralazine for ease of dosing.  Hyperlipidemia: Continue atorvastatin.  Follow-up: Return to clinic in 2 weeks for blood pressure check and BMP.  Nelva Bush, MD 05/03/2020 12:59 PM

## 2020-05-04 ENCOUNTER — Other Ambulatory Visit: Payer: Self-pay | Admitting: Family

## 2020-05-09 ENCOUNTER — Encounter: Payer: Self-pay | Admitting: Nurse Practitioner

## 2020-05-09 DIAGNOSIS — Z23 Encounter for immunization: Secondary | ICD-10-CM | POA: Insufficient documentation

## 2020-05-25 ENCOUNTER — Telehealth: Payer: Self-pay | Admitting: *Deleted

## 2020-05-25 ENCOUNTER — Other Ambulatory Visit
Admission: RE | Admit: 2020-05-25 | Discharge: 2020-05-25 | Disposition: A | Discharge status: Home / Self Care | Payer: Medicare HMO | Attending: Internal Medicine | Admitting: Internal Medicine

## 2020-05-25 DIAGNOSIS — I10 Essential (primary) hypertension: Secondary | ICD-10-CM | POA: Diagnosis not present

## 2020-05-25 DIAGNOSIS — Z79899 Other long term (current) drug therapy: Secondary | ICD-10-CM

## 2020-05-25 LAB — BASIC METABOLIC PANEL
Anion gap: 9 (ref 5–15)
BUN: 28 mg/dL — ABNORMAL HIGH (ref 8–23)
CO2: 29 mmol/L (ref 22–32)
Calcium: 9.1 mg/dL (ref 8.9–10.3)
Chloride: 99 mmol/L (ref 98–111)
Creatinine, Ser: 1.16 mg/dL — ABNORMAL HIGH (ref 0.44–1.00)
GFR, Estimated: 47 mL/min — ABNORMAL LOW (ref >60)
Glucose, Bld: 230 mg/dL — ABNORMAL HIGH (ref 70–99)
Potassium: 3.7 mmol/L (ref 3.5–5.1)
Sodium: 137 mmol/L (ref 135–145)

## 2020-05-25 NOTE — Telephone Encounter (Signed)
Attempted to call pt with lab results. No answer. Unable to leave vm.

## 2020-05-25 NOTE — Progress Notes (Signed)
Follow-up Outpatient Visit Date: 05/26/2020  Primary Care Provider: Lavera Guise, MD [address] [place] [address]  Chief Complaint: Follow-up high blood pressure  HPI:  [name] is a 79 y.o. female with history of valvular heart disease (mild to moderate MR, AI, and TR as well as mildAS),hypertension, hyperlipidemia, type 2 diabetes mellitus, rheumatoid arthritis, and anemia, who presents for follow-up of hypertension.  I last saw her 2 weeks ago, which time blood pressure was elevated at 168/72.  We agreed to increase losartan to 50 mg daily.  [name] returns today for blood pressure check.  Today, [name] reports that she has been doing relatively well.  She is tolerated the increased dose of losartan without side effects.  She notes a few episodes of palpitations lasting a second or two without associated symptoms.  She has not had any chest pain, shortness of breath, lightheadedness, or edema.  She admits to not drinking much water.  --------------------------------------------------------------------------------------------------  Cardiovascular History & Procedures: Cardiovascular Problems:  Mixed valvular heart disease  Risk Factors:  Hypertension, diabetes mellitus, and age > 50  Cath/PCI:  None  CV Surgery:  None  EP Procedures and Devices:  None  Non-Invasive Evaluation(s):  TTE (01/24/2019): Small left ventricle with moderate LVH. Hyperdynamic contraction (LVEF greater than 70%). Mild aortic valve calcification with mild stenosis and moderate regurgitation. Moderate mitral annular calcification with mild regurgitation. Mild tricuspid regurgitation. Mild pulmonary hypertension.   Recent CV Pertinent Labs: Lab Results  Component Value Date   CHOL 207 (H) 10/31/2019   CHOL 228 (H) 07/25/2018   HDL 79 10/31/2019   HDL 92 07/25/2018   LDLCALC 118 (H) 10/31/2019   LDLCALC 120 (H) 07/25/2018   TRIG 49 10/31/2019   CHOLHDL 2.6  10/31/2019   K 3.7 05/25/2020   BUN 28 (H) 05/25/2020   BUN 21 06/11/2019   CREATININE 1.16 (H) 05/25/2020    Past medical and surgical history were reviewed and updated in EPIC.  Current Meds  Medication Sig  . acetaminophen (TYLENOL) 500 MG tablet Take 500-1,000 mg by mouth every 6 (six) hours as needed.  . Alcohol Swabs (ALCOHOL PREP) PADS alcohol prep pads  . amLODipine (NORVASC) 5 MG tablet Take 2 tablets (10 mg total) by mouth every evening.  Marland Kitchen aspirin EC 81 MG tablet Take 1 tablet (81 mg total) by mouth daily.  Marland Kitchen atorvastatin (LIPITOR) 10 MG tablet TAKE 1 TABLET AT BEDTIME   FOR HIGH CHOLESTEROL  . ergocalciferol (DRISDOL) 1.25 MG (50000 UT) capsule Take 1 capsule (50,000 Units total) by mouth once a week.  . Ferrous Gluconate (IRON 27 PO) Take 27 mg by mouth daily.   . folic acid (FOLVITE) 1 MG tablet Take 2 tablets (2 mg total) by mouth daily.  Marland Kitchen glimepiride (AMARYL) 4 MG tablet Take 1/2 tablet with breakfast and 1/2 tablet with dinner  . glucose blood (ONETOUCH VERIO) test strip Use as directed  For twice a day diag e11.65  . hydrALAZINE (APRESOLINE) 10 MG tablet Take 1 tablet (10 mg total) by mouth 3 (three) times daily.  Marland Kitchen losartan (COZAAR) 50 MG tablet Take 1 tablet (50 mg total) by mouth daily.  . methotrexate 2.5 MG tablet Take 15 mg by mouth once a week.   . neomycin-polymyxin b-dexamethasone (MAXITROL) 3.5-10000-0.1 OINT SMARTSIG:Application Left Eye 4 Times Daily PRN  . ofloxacin (OCUFLOX) 0.3 % ophthalmic solution Place 1 drop into the left eye 4 (four) times daily.  . pantoprazole (PROTONIX) 40 MG  tablet TAKE 1 TABLET DAILY FOR    REFLUX  . prednisoLONE acetate (PRED FORTE) 1 % ophthalmic suspension Administer 1 drop into the left eye four (4) times a day.    Allergies: Patient has no known allergies.  Social History   Tobacco Use  . Smoking status: Never Smoker  . Smokeless tobacco: Never Used  Vaping Use  . Vaping Use: Never used  Substance Use Topics  .  Alcohol use: No  . Drug use: No    Family History  Problem Relation Age of Onset  . Hypertension Mother   . Diabetes Father   . Heart disease Brother 6       Open heart surgery (details unknown)  . Breast cancer Neg Hx     Review of Systems: A 12-system review of systems was performed and was negative except as noted in the HPI.  --------------------------------------------------------------------------------------------------  Physical Exam: BP 130/60 (BP Location: Right Arm, Patient Position: Sitting, Cuff Size: Normal)   Pulse (!) 54   Ht 5\' 2"  (1.575 m)   Wt 140 lb (63.5 kg)   SpO2 97%   BMI 25.61 kg/m   General: NAD. Neck: No JVD or HJR. Lungs: Clear to auscultation bilateral without wheeze or crackles. Heart: Bradycardic but regular with 2/6 systolic murmur. Abdomen: Soft, nontender, nondistended. Extremities: No lower extremity edema.  EKG: Sinus bradycardia with borderline LVH and nonspecific T wave changes.  Compared with prior tracing from 01/26/2020, nonspecific T wave changes in the anterolateral leads are more pronounced.  Lab Results  Component Value Date   WBC 4.9 10/31/2019   HGB 11.4 (L) 10/31/2019   HCT 34.6 (L) 10/31/2019   MCV 86.5 10/31/2019   PLT 163 10/31/2019    Lab Results  Component Value Date   NA 137 05/25/2020   K 3.7 05/25/2020   CL 99 05/25/2020   CO2 29 05/25/2020   BUN 28 (H) 05/25/2020   CREATININE 1.16 (H) 05/25/2020   GLUCOSE 230 (H) 05/25/2020   ALT 15 10/31/2019    Lab Results  Component Value Date   CHOL 207 (H) 10/31/2019   HDL 79 10/31/2019   LDLCALC 118 (H) 10/31/2019   TRIG 49 10/31/2019   CHOLHDL 2.6 10/31/2019    --------------------------------------------------------------------------------------------------  ASSESSMENT AND PLAN: Hypertension: Blood pressure improved today.  Ideally, I would like to get [name] off hydralazine to improve ease of dosing.  However, she has already filled her pill  box for the next month.  We have therefore agreed to defer medication changes today.  At her follow-up visit, we can consider discontinuation of hydralazine if her blood pressure remains reasonably well controlled.  Valvular heart disease: No evidence of heart failure.  Continue to work on blood pressure control.  Follow-up: Return to clinic in 6 months.  12/31/2019, MD 05/26/2020 11:12 AM

## 2020-05-25 NOTE — Telephone Encounter (Signed)
-----   Message from Yvonne Kendall, MD sent at 05/25/2020 12:44 PM EST ----- Labs show improved potassium; otherwise stable.  Follow-up in the office as planned tomorrow.

## 2020-05-26 ENCOUNTER — Encounter: Payer: Self-pay | Admitting: Internal Medicine

## 2020-05-26 ENCOUNTER — Other Ambulatory Visit: Payer: Self-pay

## 2020-05-26 ENCOUNTER — Ambulatory Visit: Payer: Medicare HMO | Admitting: Internal Medicine

## 2020-05-26 VITALS — BP 130/60 | HR 54 | Ht 62.0 in | Wt 140.0 lb

## 2020-05-26 DIAGNOSIS — I10 Essential (primary) hypertension: Secondary | ICD-10-CM

## 2020-05-26 DIAGNOSIS — I38 Endocarditis, valve unspecified: Secondary | ICD-10-CM

## 2020-05-26 NOTE — Patient Instructions (Signed)
Medication Instructions:  Your physician recommends that you continue on your current medications as directed. Please refer to the Current Medication list given to you today.  *If you need a refill on your cardiac medications before your next appointment, please call your pharmacy*   Lab Work: None Ordered If you have labs (blood work) drawn today and your tests are completely normal, you will receive your results only by: . MyChart Message (if you have MyChart) OR . A paper copy in the mail If you have any lab test that is abnormal or we need to change your treatment, we will call you to review the results.   Testing/Procedures: None Ordered   Follow-Up: At [facility], you and your health needs are our priority.  As part of our continuing mission to provide you with exceptional heart care, we have created designated Provider Care Teams.  These Care Teams include your primary Cardiologist (physician) and Advanced Practice Providers (APPs -  Physician Assistants and Nurse Practitioners) who all work together to provide you with the care you need, when you need it.  We recommend signing up for the patient portal called "MyChart".  Sign up information is provided on this After Visit Summary.  MyChart is used to connect with patients for Virtual Visits (Telemedicine).  Patients are able to view lab/test results, encounter notes, upcoming appointments, etc.  Non-urgent messages can be sent to your provider as well.   To learn more about what you can do with MyChart, go to [email or web address].    Your next appointment:   6 month(s)  The format for your next appointment:   In Person  Provider:   Christopher End, MD   Other Instructions   

## 2020-05-27 ENCOUNTER — Encounter: Payer: Self-pay | Admitting: Internal Medicine

## 2020-05-27 NOTE — Telephone Encounter (Signed)
Per result note 05/25/20 pt notified of results.

## 2020-06-08 ENCOUNTER — Other Ambulatory Visit: Payer: Self-pay

## 2020-06-08 DIAGNOSIS — I10 Essential (primary) hypertension: Secondary | ICD-10-CM

## 2020-06-08 MED ORDER — PANTOPRAZOLE SODIUM 40 MG PO TBEC: DELAYED_RELEASE_TABLET | ORAL | 2 refills | Status: DC

## 2020-06-09 ENCOUNTER — Telehealth: Payer: Self-pay | Admitting: Internal Medicine

## 2020-06-09 ENCOUNTER — Other Ambulatory Visit: Payer: Self-pay

## 2020-06-09 ENCOUNTER — Other Ambulatory Visit: Payer: Self-pay | Admitting: *Deleted

## 2020-06-09 DIAGNOSIS — I10 Essential (primary) hypertension: Secondary | ICD-10-CM

## 2020-06-09 DIAGNOSIS — E1165 Type 2 diabetes mellitus with hyperglycemia: Secondary | ICD-10-CM

## 2020-06-09 DIAGNOSIS — M0579 Rheumatoid arthritis with rheumatoid factor of multiple sites without organ or systems involvement: Secondary | ICD-10-CM

## 2020-06-09 MED ORDER — HYDRALAZINE HCL 10 MG PO TABS
10.0000 mg | ORAL_TABLET | Freq: Three times a day (TID) | ORAL | 2 refills | Status: DC
Start: 2020-06-09 — End: 2020-07-08

## 2020-06-09 MED ORDER — ONETOUCH VERIO VI STRP: ORAL_STRIP | 3 refills | Status: DC

## 2020-06-09 MED ORDER — ATORVASTATIN CALCIUM 10 MG PO TABS: ORAL_TABLET | ORAL | 2 refills | Status: DC

## 2020-06-09 MED ORDER — FOLIC ACID 1 MG PO TABS: 2.0000 mg | ORAL_TABLET | Freq: Every day | ORAL | 1 refills | Status: DC

## 2020-06-09 MED ORDER — GLIMEPIRIDE 4 MG PO TABS: ORAL_TABLET | ORAL | 3 refills | Status: DC

## 2020-06-09 MED ORDER — LOSARTAN POTASSIUM 50 MG PO TABS: 50.0000 mg | ORAL_TABLET | Freq: Every day | ORAL | 2 refills | Status: DC

## 2020-06-09 MED ORDER — AMLODIPINE BESYLATE 5 MG PO TABS: 10.0000 mg | ORAL_TABLET | Freq: Every evening | ORAL | 2 refills | Status: DC

## 2020-06-09 NOTE — Telephone Encounter (Signed)
Requested Prescriptions   Signed Prescriptions Disp Refills  . amLODipine (NORVASC) 5 MG tablet 180 tablet 2    Sig: Take 2 tablets (10 mg total) by mouth every evening.    Authorizing Provider: Rise Mu    Ordering User: Eugenio Hoes, Necola Bluestein C  . atorvastatin (LIPITOR) 10 MG tablet 90 tablet 2    Sig: TAKE 1 TABLET AT BEDTIME   FOR HIGH CHOLESTEROL    Authorizing Provider: Rise Mu    Ordering User: Eugenio Hoes, Willodene Stallings C  . hydrALAZINE (APRESOLINE) 10 MG tablet 90 tablet 2    Sig: Take 1 tablet (10 mg total) by mouth 3 (three) times daily.    Authorizing Provider: Rise Mu    Ordering User: Britt Bottom losartan (COZAAR) 50 MG tablet 90 tablet 2    Sig: Take 1 tablet (50 mg total) by mouth daily.    Authorizing Provider: Rise Mu    Ordering User: Britt Bottom

## 2020-06-09 NOTE — Telephone Encounter (Signed)
Patient calling  Has changed pharmacies and will need all heart medications transferred to [facility] Patient unsure of which medications - please call if needed

## 2020-06-09 NOTE — Telephone Encounter (Signed)
All cardiac medication Rx have been forwarded to [facility] for 90 day refills per pt request.

## 2020-06-21 DIAGNOSIS — H35352 Cystoid macular degeneration, left eye: Secondary | ICD-10-CM | POA: Diagnosis not present

## 2020-07-05 ENCOUNTER — Other Ambulatory Visit: Payer: Self-pay

## 2020-07-06 ENCOUNTER — Telehealth: Payer: Self-pay | Admitting: Internal Medicine

## 2020-07-06 NOTE — Telephone Encounter (Signed)
*  STAT* If patient is at the pharmacy, call can be transferred to refill team.   1. Which medications need to be refilled? (please list name of each medication and dose if known) Hydrochlorothiazide 25 MG  2. Which pharmacy/location (including street and city if local pharmacy) is medication to be sent to? [facility]  3. Do they need a 30 day or 90 day supply? 90 day   Patient states that she has been taking this medication but not on medication list.  Please call if patient should not be taking.

## 2020-07-06 NOTE — Telephone Encounter (Signed)
Patient returned call and stated she has been taking this medication since August 2021 and that was last time the medication was filled. She does have refills left on the prescription but she is now using [facility] and need a new prescription. However patient is not sure if she is supposed to be taking this medication because she feels like she is taking too much medication. Please clarify if patient she still be taking the HCTZ, if so please send the prescription to [facility] for refill. Patient understands it will be sometime tomorrow but she receives a return call in reference to this.

## 2020-07-06 NOTE — Telephone Encounter (Signed)
Called patient to verify if she is still taking this medication but she was not available. Left message for patient to call back and clarify.

## 2020-07-07 NOTE — Telephone Encounter (Signed)
Called and spoke to pt. Pt asking clarification whether she is to continue taking Hydrochlorothiazide 25mg  daily.  Pt requesting refill to be sent to [facility] if she is to continue.   Office visit 01/26/20 with [name], PA - per note pt is to "continue HCTZ" Appears in reviewing chart that pt's HCTZ was discontinued on her med list after this visit without explanation.  Follow up OV 05/03/21 w/ Dr. Saunders Revel - "HCTZ discontinued for unclear reasons"  HCTZ was not on pt's med list at last office visit 05/26/21, with no med changes recommended.  However, pt states that she has continued to take HCTZ 25mg  daily. Pt states she does not check BP at home to verify readings.   Please advise if pt should continue HCTZ 25mg  daily as she has been taking.

## 2020-07-08 MED ORDER — HYDROCHLOROTHIAZIDE 25 MG PO TABS: 25.0000 mg | ORAL_TABLET | Freq: Every day | ORAL | 3 refills | Status: DC

## 2020-07-08 NOTE — Telephone Encounter (Signed)
If patient continues to take HCTZ, then I suggest that we refill her HCTZ 25 mg daily.  She should follow-up with [place] and her PCP as previously recommended.  Nelva Bush, MD [facility]

## 2020-07-08 NOTE — Telephone Encounter (Signed)
Spoke to pt. Notified that per Dr. Saunders Revel, since she has continued to take HCTZ that we will refill HCTZ 25mg  daily for 90 day x 3 to [facility].  Pt appreciative and verbalized understanding. She does confirm that she has an appt with her PCP next week 07/13/20.

## 2020-07-08 NOTE — Telephone Encounter (Signed)
Was able to reach out to Mrs. Bigleow, she went through her medication bottles and she is taking HCTZ 25 mg daily, this medication was added back to her med list. Pt is also taking Hydralazine 10 mg TID, pt questions if she should also be taking this as well, advised pt that at her last visit on 12/29, Dr. Saunders Revel stated "we can consider discontinuation of hydralazine if her blood pressure remains reasonably well controlled" on her f/u visit. Pt verbalized understanding, will continue to take both mediation at this time. Was able to schedule pt with [name] on 2/17 at 8am to go over all pt's medications, pt reports she is confused with all she should be taking. Pt will bring in all her medications for review and to reassess her BP and HR. Otherwise all questions or concerns were address and no additional concerns at this time. Agreeable to plan, will call back for anything further.

## 2020-07-08 NOTE — Addendum Note (Signed)
Addended by: Darlyne Russian on: 07/08/2020 10:00 AM   Modules accepted: Orders

## 2020-07-13 ENCOUNTER — Ambulatory Visit (INDEPENDENT_AMBULATORY_CARE_PROVIDER_SITE_OTHER): Payer: Medicare Other | Admitting: Hospice and Palliative Medicine

## 2020-07-13 ENCOUNTER — Encounter: Payer: Self-pay | Admitting: Hospice and Palliative Medicine

## 2020-07-13 ENCOUNTER — Other Ambulatory Visit: Payer: Self-pay

## 2020-07-13 VITALS — BP 142/76 | HR 76 | Temp 97.5°F | Resp 16 | Ht 62.0 in | Wt 139.8 lb

## 2020-07-13 DIAGNOSIS — K2100 Gastro-esophageal reflux disease with esophagitis, without bleeding: Secondary | ICD-10-CM | POA: Diagnosis not present

## 2020-07-13 DIAGNOSIS — K5900 Constipation, unspecified: Secondary | ICD-10-CM | POA: Diagnosis not present

## 2020-07-13 DIAGNOSIS — I38 Endocarditis, valve unspecified: Secondary | ICD-10-CM

## 2020-07-13 DIAGNOSIS — I10 Essential (primary) hypertension: Secondary | ICD-10-CM

## 2020-07-13 DIAGNOSIS — Z87898 Personal history of other specified conditions: Secondary | ICD-10-CM

## 2020-07-13 DIAGNOSIS — E1165 Type 2 diabetes mellitus with hyperglycemia: Secondary | ICD-10-CM

## 2020-07-13 LAB — POCT GLYCOSYLATED HEMOGLOBIN (HGB A1C): Hemoglobin A1C: 6.9 % — AB (ref 4.0–5.6)

## 2020-07-13 NOTE — Progress Notes (Signed)
[facility] [place], [place] [address]  Internal MEDICINE  Office Visit Note  Patient Name: [name]  [id number]  [id number]  Date of Service: 07/17/2020  Chief Complaint  Patient presents with  . Follow-up    The last Friday in December she fell and hurt her left arm.   . Hypertension  . Diabetes  . Arthritis    HPI Patient is here for routine follow-up Since our last office visit she has had a fall--happened the last Friday of Decemeber Prior to fall she became flushed and diaphoretic--made her way to her bathroom and ended up falling and losing consciousness Son was in the home and was able to get her back to her feet--her son requested that she be evaluated at the hospital but she refused Complaining of left arm pain but is improving since fall Scheduled to see cardiology later this week for further evaluation as well Frequently monitors glucose levels at home which have been stable--denies hypoglycemic events Significant cardiac history of mitral regurgitation, aortic insufficiency, tricuspid regurgitation and aortic stenosis  Does not recall hitting her head or any other injuries--she is not wanting a thorough work up but is planning to take her son with her to cardiologist visit as she feels her fall was related to cardiac medications   Current Medication: Outpatient Encounter Medications as of 07/13/2020  Medication Sig  . acetaminophen (TYLENOL) 500 MG tablet Take 500-1,000 mg by mouth every 6 (six) hours as needed.  . Alcohol Swabs (ALCOHOL PREP) PADS alcohol prep pads  . amLODipine (NORVASC) 5 MG tablet Take 2 tablets (10 mg total) by mouth every evening.  Marland Kitchen aspirin EC 81 MG tablet Take 1 tablet (81 mg total) by mouth daily.  Marland Kitchen atorvastatin (LIPITOR) 10 MG tablet TAKE 1 TABLET AT BEDTIME   FOR HIGH CHOLESTEROL  . Ferrous Gluconate (IRON 27 PO) Take 27 mg by mouth daily.   . folic acid (FOLVITE) 1 MG tablet Take 2 tablets (2 mg total)  by mouth daily.  Marland Kitchen glimepiride (AMARYL) 4 MG tablet Take 1/2 tablet with breakfast and 1/2 tablet with dinner  . glucose blood (ONETOUCH VERIO) test strip Use as directed  For twice a day diag e11.65  . hydrochlorothiazide (HYDRODIURIL) 25 MG tablet Take 1 tablet (25 mg total) by mouth daily.  . methotrexate 2.5 MG tablet Take 15 mg by mouth once a week.   . neomycin-polymyxin b-dexamethasone (MAXITROL) 3.5-10000-0.1 OINT SMARTSIG:Application Left Eye 4 Times Daily PRN  . ofloxacin (OCUFLOX) 0.3 % ophthalmic solution Place 1 drop into the left eye 4 (four) times daily.  . pantoprazole (PROTONIX) 40 MG tablet TAKE 1 TABLET DAILY FOR    REFLUX  . prednisoLONE acetate (PRED FORTE) 1 % ophthalmic suspension Administer 1 drop into the left eye four (4) times a day.  . [DISCONTINUED] ergocalciferol (DRISDOL) 1.25 MG (50000 UT) capsule Take 1 capsule (50,000 Units total) by mouth once a week.  . [DISCONTINUED] hydrALAZINE (APRESOLINE) 10 MG tablet Take 10 mg by mouth 3 (three) times daily.  . [DISCONTINUED] losartan (COZAAR) 50 MG tablet Take 1 tablet (50 mg total) by mouth daily.   No facility-administered encounter medications on file as of 07/13/2020.    Surgical History: Past Surgical History:  Procedure Laterality Date  . ABDOMINAL HYSTERECTOMY    . ANTERIOR VITRECTOMY Left 03/10/2020   Procedure: ANTERIOR VITRECTOMY;  Surgeon: Leandrew Koyanagi, MD;  Location: [facility];  Service: Ophthalmology;  Laterality: Left;  . CATARACT  EXTRACTION    . CATARACT EXTRACTION W/PHACO Left 03/10/2020   Procedure: CATARACT EXTRACTION PHACO  (IOC) LEFT  DIABETIC 16.27  02:09.7  12.5%;  Surgeon: Leandrew Koyanagi, MD;  Location: [facility];  Service: Ophthalmology;  Laterality: Left;  Diabetic - oral meds  . COLONOSCOPY WITH PROPOFOL N/A 10/25/2017   Procedure: COLONOSCOPY WITH PROPOFOL;  Surgeon: Lollie Sails, MD;  Location: [facility] ENDOSCOPY;  Service: Endoscopy;  Laterality: N/A;   . ESOPHAGOGASTRODUODENOSCOPY (EGD) WITH PROPOFOL N/A 10/25/2017   Procedure: ESOPHAGOGASTRODUODENOSCOPY (EGD) WITH PROPOFOL;  Surgeon: Lollie Sails, MD;  Location: [facility] ENDOSCOPY;  Service: Endoscopy;  Laterality: N/A;  . ESOPHAGOGASTRODUODENOSCOPY (EGD) WITH PROPOFOL N/A 03/04/2019   Procedure: ESOPHAGOGASTRODUODENOSCOPY (EGD) WITH PROPOFOL;  Surgeon: Lollie Sails, MD;  Location: [facility] ENDOSCOPY;  Service: Endoscopy;  Laterality: N/A;  . EUS N/A 01/17/2018   Procedure: ESOPHAGEAL ENDOSCOPIC ULTRASOUND (EUS) RADIAL;  Surgeon: Reita Cliche, MD;  Location: [facility] ENDOSCOPY;  Service: Gastroenterology;  Laterality: N/A;  . right side had fatty tissue taken off    . TONSILLECTOMY      Medical History: Past Medical History:  Diagnosis Date  . Anemia   . Arthritis   . Diabetes mellitus without complication ([place])   . GERD (gastroesophageal reflux disease)   . Heart murmur   . Hyperlipidemia   . Hypertension   . Leaky heart valve   . Rheumatoid arthritis ([place])   . Wears dentures     Family History: Family History  Problem Relation Age of Onset  . Hypertension Mother   . Diabetes Father   . Heart disease Brother 72       Open heart surgery (details unknown)  . Breast cancer Neg Hx     Social History   Socioeconomic History  . Marital status: Widowed    Spouse name: Not on file  . Number of children: Not on file  . Years of education: Not on file  . Highest education level: Not on file  Occupational History  . Not on file  Tobacco Use  . Smoking status: Never Smoker  . Smokeless tobacco: Never Used  Vaping Use  . Vaping Use: Never used  Substance and Sexual Activity  . Alcohol use: No  . Drug use: No  . Sexual activity: Not on file  Other Topics Concern  . Not on file  Social History Narrative  . Not on file   Social Determinants of Health   Financial Resource Strain: Not on file  Food Insecurity: Not on file  Transportation Needs: Not on file   Physical Activity: Not on file  Stress: Not on file  Social Connections: Not on file  Intimate Partner Violence: Not on file      Review of Systems  Constitutional: Negative for chills, diaphoresis and fatigue.  HENT: Negative for ear pain, postnasal drip and sinus pressure.   Eyes: Negative for photophobia, discharge, redness, itching and visual disturbance.  Respiratory: Negative for cough, shortness of breath and wheezing.   Cardiovascular: Negative for chest pain, palpitations and leg swelling.  Gastrointestinal: Negative for abdominal pain, constipation, diarrhea, nausea and vomiting.  Genitourinary: Negative for dysuria and flank pain.  Musculoskeletal: Negative for arthralgias, back pain, gait problem and neck pain.  Skin: Negative for color change.  Allergic/Immunologic: Negative for environmental allergies and food allergies.  Neurological: Negative for dizziness and headaches.  Hematological: Does not bruise/bleed easily.  Psychiatric/Behavioral: Negative for agitation, behavioral problems (depression) and hallucinations.    Vital Signs: BP Marland Kitchen)  142/76   Pulse 76   Temp (!) 97.5 F (36.4 C)   Resp 16   Ht 5\' 2"  (1.575 m)   Wt 139 lb 12.8 oz (63.4 kg)   SpO2 97%   BMI 25.57 kg/m    Physical Exam Vitals reviewed.  Constitutional:      Appearance: Normal appearance. She is normal weight.  Cardiovascular:     Rate and Rhythm: Normal rate and regular rhythm.     Pulses: Normal pulses.     Heart sounds: Normal heart sounds.  Pulmonary:     Effort: Pulmonary effort is normal.     Breath sounds: Normal breath sounds.  Abdominal:     General: Abdomen is flat.     Palpations: Abdomen is soft.  Musculoskeletal:        General: Normal range of motion.     Cervical back: Normal range of motion.  Skin:    General: Skin is warm.  Neurological:     General: No focal deficit present.     Mental Status: She is alert and oriented to person, place, and time. Mental  status is at baseline.  Psychiatric:        Mood and Affect: Mood normal.        Behavior: Behavior normal.        Thought Content: Thought content normal.        Judgment: Judgment normal.    Assessment/Plan: 1. Uncontrolled type 2 diabetes mellitus with hyperglycemia (HCC) A1C 6.9--well controlled for age group Avoid hypoglycemia in the event of previous syncopal episode Continue current therapy at this time - POCT HgB A1C  2. History of syncope Syncope and collapse in December--did not seek treatment for event Significant cardiac history--scheduled for cardiology office visit later this week  3. Essential hypertension BP minimally elevated today-scheduled for cardiology office visit this week, appreciate cardiology input for hypertension management  4. Valvular heart disease Followed by cardiology  General Counseling: Shaakira verbalizes understanding of the findings of todays visit and agrees with plan of treatment. I have discussed any further diagnostic evaluation that may be needed or ordered today. We also reviewed her medications today. she has been encouraged to call the office with any questions or concerns that should arise related to todays visit.    Orders Placed This Encounter  Procedures  . POCT HgB A1C    Time spent: 30 Minutes Time spent includes review of chart, medications, test results and follow-up plan with the patient.  This patient was seen by Theodoro Grist AGNP-C in Collaboration with Dr Lavera Guise as a part of collaborative care agreement     Tanna Furry. Damarea Merkel AGNP-C Internal medicine

## 2020-07-14 ENCOUNTER — Other Ambulatory Visit: Payer: Self-pay

## 2020-07-14 DIAGNOSIS — Z79899 Other long term (current) drug therapy: Secondary | ICD-10-CM | POA: Diagnosis not present

## 2020-07-14 DIAGNOSIS — M0579 Rheumatoid arthritis with rheumatoid factor of multiple sites without organ or systems involvement: Secondary | ICD-10-CM | POA: Diagnosis not present

## 2020-07-14 DIAGNOSIS — E559 Vitamin D deficiency, unspecified: Secondary | ICD-10-CM

## 2020-07-14 MED ORDER — ERGOCALCIFEROL 1.25 MG (50000 UT) PO CAPS: 50000.0000 [IU] | ORAL_CAPSULE | ORAL | 0 refills | Status: DC

## 2020-07-14 NOTE — Progress Notes (Signed)
Cardiology Office Note    Date:  07/15/2020   ID:  [name], DOB 1941-01-16, MRN [id number]  PCP:  Lavera Guise, MD  Cardiologist:  Nelva Bush, MD  Electrophysiologist:  None   Chief Complaint: Follow up   History of Present Illness:   [name] is a 80 y.o. female with history of valvular heart disease including mild to moderate mitral regurgitation, aortic insufficiency, tricuspid regurgitation, and mild aortic stenosis. She also has a history of DM2, HTN, HLD, RA, and anemia who presents for medication review.   Prior echo, obtained through outside office, showed a hyperdynamic LVSF with an EF > 70%, moderate LVH, small left ventricle, normal RVSF and ventricular cavity size, mild AS, moderate AI, mild MR, moderate TR, mild pulmonary hypertension. She has since been followed at our [facility] office, noting stable exertional dyspnea and sporadic "twitches" of randomly occurring chest pain.  She underwent echo in 02/2020 for follow-up of her valvular heart disease which demonstrated an EF of 60 to 65%, no regional wall motion abnormalities, grade 1 diastolic dysfunction, mild LVH, normal RV systolic function and ventricular cavity size, mildly elevated PASP at 42.2 mmHg, mild mitral regurgitation, mild aortic valve insufficiency, mild to moderate aortic valve sclerosis without evidence of stenosis.  When compared to prior echo, her valvular heart disease was stable and her right-sided pressure was improved.  She was last seen in the office in 04/2020 for follow-up of hypertension at which time she was doing relatively well.  She was tolerating the increased dose of losartan.  She did continue to note intermittent palpitations that would last a second or 2 without associated symptoms.  It was recommended to possibly discontinue hydralazine and follow-up for ease of dosing her medications if her blood pressure remained well controlled.  Following this visit there was some  confusion regarding her HCTZ as it appears this was discontinued in late 04/2020 though she continued to take this medication.  Given confusion regarding her medications appointment was scheduled today for medication reconciliation from a cardiac perspective.  She comes in today accompanied by her son initially for a medication reconciliation visit.  However, she does tell me she suffered a syncopal episode on 05/28/2020.  She reports standing in her kitchen when she suddenly felt flushed and diaphoretic.  With this she felt quite weak and felt like she was going to be sick.  She started to ambulate to the bathroom and the next thing she remembers is waking up on her back.  Her son was in the house in a different room and she called for him.  By the time he got to her she was up on her feet.  She does not believe she hit her head.  She did hit her left elbow.  There was no associated chest pain, dyspnea, or palpitations.  No episodes since.  This episode occurred later in the evening and she had not recently changed positions or taken any of her medications.  With regards to her medications she is currently taking amlodipine 10 mg in the evening, HCTZ 25 mg, losartan 50 mg, and hydralazine 10 mg twice daily along with aspirin and atorvastatin.  She has not routinely been checking her blood pressure at home.  She does report some underlying whitecoat hypertension.  She does watch her sodium intake.   Labs independently reviewed: 06/2020 - A1c 6.9 04/2020 - potassium 3.7, BUN 28, serum creatinine 1.16 10/2019 - TSH normal, TC 207, TG 49,  HDL 79, LDL 118, albumin 4.1, AST/ALT normal, Hgb 11.4, PLT 163  Past Medical History:  Diagnosis Date  . Anemia   . Arthritis   . Diabetes mellitus without complication ([place])   . GERD (gastroesophageal reflux disease)   . Heart murmur   . Hyperlipidemia   . Hypertension   . Leaky heart valve   . Rheumatoid arthritis ([place])   . Wears dentures     Past Surgical  History:  Procedure Laterality Date  . ABDOMINAL HYSTERECTOMY    . ANTERIOR VITRECTOMY Left 03/10/2020   Procedure: ANTERIOR VITRECTOMY;  Surgeon: Leandrew Koyanagi, MD;  Location: [place];  Service: Ophthalmology;  Laterality: Left;  . CATARACT EXTRACTION    . CATARACT EXTRACTION W/PHACO Left 03/10/2020   Procedure: CATARACT EXTRACTION PHACO  (IOC) LEFT  DIABETIC 16.27  02:09.7  12.5%;  Surgeon: Leandrew Koyanagi, MD;  Location: [place];  Service: Ophthalmology;  Laterality: Left;  Diabetic - oral meds  . COLONOSCOPY WITH PROPOFOL N/A 10/25/2017   Procedure: COLONOSCOPY WITH PROPOFOL;  Surgeon: Lollie Sails, MD;  Location[facility] ENDOSCOPY;  Service: Endoscopy;  Laterality: N/A;  . ESOPHAGOGASTRODUODENOSCOPY (EGD) WITH PROPOFOL N/A 10/25/2017   Procedure: ESOPHAGOGASTRODUODENOSCOPY (EGD) WITH PROPOFOL;  Surgeon: Lollie Sails, MD;  Location: [facility] ENDOSCOPY;  Service: Endoscopy;  Laterality: N/A;  . ESOPHAGOGASTRODUODENOSCOPY (EGD) WITH PROPOFOL N/A 03/04/2019   Procedure: ESOPHAGOGASTRODUODENOSCOPY (EGD) WITH PROPOFOL;  Surgeon: Lollie Sails, MD;  Location: [facility] ENDOSCOPY;  Service: Endoscopy;  Laterality: N/A;  . EUS N/A 01/17/2018   Procedure: ESOPHAGEAL ENDOSCOPIC ULTRASOUND (EUS) RADIAL;  Surgeon: Reita Cliche, MD;  Location: [facility] ENDOSCOPY;  Service: Gastroenterology;  Laterality: N/A;  . right side had fatty tissue taken off    . TONSILLECTOMY      Current Medications: Current Meds  Medication Sig  . acetaminophen (TYLENOL) 500 MG tablet Take 500-1,000 mg by mouth every 6 (six) hours as needed.  . Alcohol Swabs (ALCOHOL PREP) PADS alcohol prep pads  . amLODipine (NORVASC) 5 MG tablet Take 2 tablets (10 mg total) by mouth every evening.  Marland Kitchen aspirin EC 81 MG tablet Take 1 tablet (81 mg total) by mouth daily.  Marland Kitchen atorvastatin (LIPITOR) 10 MG tablet TAKE 1 TABLET AT BEDTIME   FOR HIGH CHOLESTEROL  . ergocalciferol (DRISDOL) 1.25 MG (50000 UT)  capsule Take 1 capsule (50,000 Units total) by mouth once a week.  . Ferrous Gluconate (IRON 27 PO) Take 27 mg by mouth daily.   . folic acid (FOLVITE) 1 MG tablet Take 2 tablets (2 mg total) by mouth daily.  Marland Kitchen glimepiride (AMARYL) 4 MG tablet Take 1/2 tablet with breakfast and 1/2 tablet with dinner  . glucose blood (ONETOUCH VERIO) test strip Use as directed  For twice a day diag e11.65  . hydrochlorothiazide (HYDRODIURIL) 25 MG tablet Take 1 tablet (25 mg total) by mouth daily.  . methotrexate 2.5 MG tablet Take 15 mg by mouth once a week.   . neomycin-polymyxin b-dexamethasone (MAXITROL) 3.5-10000-0.1 OINT SMARTSIG:Application Left Eye 4 Times Daily PRN  . ofloxacin (OCUFLOX) 0.3 % ophthalmic solution Place 1 drop into the left eye 4 (four) times daily.  . pantoprazole (PROTONIX) 40 MG tablet TAKE 1 TABLET DAILY FOR    REFLUX  . prednisoLONE acetate (PRED FORTE) 1 % ophthalmic suspension Administer 1 drop into the left eye four (4) times a day.  . [DISCONTINUED] hydrALAZINE (APRESOLINE) 10 MG tablet Take 10 mg by mouth 3 (three) times daily.  . [DISCONTINUED] losartan (  COZAAR) 50 MG tablet Take 1 tablet (50 mg total) by mouth daily.    Allergies:   Patient has no known allergies.   Social History   Socioeconomic History  . Marital status: Widowed    Spouse name: Not on file  . Number of children: Not on file  . Years of education: Not on file  . Highest education level: Not on file  Occupational History  . Not on file  Tobacco Use  . Smoking status: Never Smoker  . Smokeless tobacco: Never Used  Vaping Use  . Vaping Use: Never used  Substance and Sexual Activity  . Alcohol use: No  . Drug use: No  . Sexual activity: Not on file  Other Topics Concern  . Not on file  Social History Narrative  . Not on file   Social Determinants of Health   Financial Resource Strain: Not on file  Food Insecurity: Not on file  Transportation Needs: Not on file  Physical Activity: Not on  file  Stress: Not on file  Social Connections: Not on file     Family History:  The patient's family history includes Diabetes in her father; Heart disease (age of onset: 2) in her brother; Hypertension in her mother. There is no history of Breast cancer.  ROS:   Review of Systems  Constitutional: Positive for malaise/fatigue. Negative for chills, diaphoresis, fever and weight loss.  HENT: Negative for congestion.   Eyes: Negative for discharge and redness.  Respiratory: Negative for cough, sputum production, shortness of breath and wheezing.   Cardiovascular: Negative for chest pain, palpitations, orthopnea, claudication, leg swelling and PND.  Gastrointestinal: Negative for abdominal pain, heartburn, nausea and vomiting.  Musculoskeletal: Positive for falls and joint pain. Negative for myalgias.  Skin: Negative for rash.  Neurological: Positive for loss of consciousness and weakness. Negative for dizziness, tingling, tremors, sensory change, speech change and focal weakness.  Endo/Heme/Allergies: Does not bruise/bleed easily.  Psychiatric/Behavioral: Negative for substance abuse. The patient is not nervous/anxious.   All other systems reviewed and are negative.    EKGs/Labs/Other Studies Reviewed:    Studies reviewed were summarized above. The additional studies were reviewed today:  2D echo 02/2020: 1. Left ventricular ejection fraction, by estimation, is 60 to 65%. The  left ventricle has normal function. The left ventricle has no regional  wall motion abnormalities. There is mild left ventricular hypertrophy.  Left ventricular diastolic parameters  are consistent with Grade I diastolic dysfunction (impaired relaxation).  2. Right ventricular systolic function is normal. The right ventricular  size is normal. There is mildly elevated pulmonary artery systolic  pressure. The estimated right ventricular systolic pressure is 41.2 mmHg.  3. Mild mitral valve regurgitation.   4. The aortic valve is normal in structure. Aortic valve regurgitation is  mild. Mild to moderate aortic valve sclerosis/calcification is present,  without any evidence of aortic stenosis. __________  2D echo 12/2018 (outside office): Hyperdynamic EF > 70%, moderate LVH with small left ventricle, mild aortic valve calcification with mild stenosis and moderate regurgitation, moderate mitral annular calcification with mild regurgitation, mild tricuspid regurgitation, mild pulmonary hypertension   EKG:  EKG is ordered today.  The EKG ordered today demonstrates sinus bradycardia, 54 bpm, LVH with early repolarization abnormality, lateral T wave inversion, no significant changes when compared to prior  Recent Labs: 10/31/2019: ALT 15; Hemoglobin 11.4; Platelets 163; TSH 1.856 05/25/2020: BUN 28; Creatinine, Ser 1.16; Potassium 3.7; Sodium 137  Recent Lipid Panel    Component  Value Date/Time   CHOL 207 (H) 10/31/2019 0906   CHOL 228 (H) 07/25/2018 1624   TRIG 49 10/31/2019 0906   HDL 79 10/31/2019 0906   HDL 92 07/25/2018 1624   CHOLHDL 2.6 10/31/2019 0906   VLDL 10 10/31/2019 0906   LDLCALC 118 (H) 10/31/2019 0906   LDLCALC 120 (H) 07/25/2018 1624    PHYSICAL EXAM:    VS:  BP (!) 142/68 (BP Location: Left Arm, Patient Position: Sitting, Cuff Size: Normal)   Pulse 60   Ht 5\' 2"  (1.575 m)   Wt 139 lb (63 kg)   SpO2 97%   BMI 25.42 kg/m   BMI: Body mass index is 25.42 kg/m.  Physical Exam Vitals reviewed.  Constitutional:      Appearance: She is well-developed and well-nourished.  HENT:     Head: Normocephalic and atraumatic.  Eyes:     General:        Right eye: No discharge.        Left eye: No discharge.  Neck:     Vascular: No JVD.  Cardiovascular:     Rate and Rhythm: Normal rate and regular rhythm.     Pulses: No midsystolic click and no opening snap.          Posterior tibial pulses are 2+ on the right side and 2+ on the left side.     Heart sounds: S1 normal and  S2 normal. Heart sounds not distant. Murmur heard.   Harsh midsystolic murmur is present with a grade of 2/6 at the upper right sternal border radiating to the neck. High-pitched blowing holosystolic murmur of grade 2/6 is also present at the apex. No friction rub.  Pulmonary:     Effort: Pulmonary effort is normal. No respiratory distress.     Breath sounds: Normal breath sounds. No decreased breath sounds, wheezing or rales.  Chest:     Chest wall: No tenderness.  Abdominal:     General: There is no distension.     Palpations: Abdomen is soft.     Tenderness: There is no abdominal tenderness.  Musculoskeletal:        General: No edema.     Cervical back: Normal range of motion.  Skin:    General: Skin is warm and dry.     Nails: There is no clubbing or cyanosis.  Neurological:     Mental Status: She is alert and oriented to person, place, and time.  Psychiatric:        Mood and Affect: Mood and affect normal.        Speech: Speech normal.        Behavior: Behavior normal.        Thought Content: Thought content normal.        Judgment: Judgment normal.     Wt Readings from Last 3 Encounters:  07/15/20 139 lb (63 kg)  07/13/20 139 lb 12.8 oz (63.4 kg)  05/26/20 140 lb (63.5 kg)     ASSESSMENT & PLAN:   1. Possible syncope: Uncertain etiology at this time.  Occurred on the evening of 05/28/2020.  Recent echo in 02/2020 reassuring as outlined above.  Place Zio AT.  Based on these results plan for potential noninvasive ischemic evaluation.  2. HTN: Blood pressure is mildly elevated in the office today at 140/64 with repeat 142/68.  Medications were reviewed in detail.  We will discontinue hydralazine and titrate losartan to 75 mg daily.  Otherwise she will continue amlodipine and  HCTZ.  Low-sodium diet recommended.  In follow-up could consider further titration of losartan or transition to irbesartan.  Heart rate precludes addition of beta-blocker or clonidine.  3. Valvular  heart disease: Stable and no evidence of heart failure on recent echo.  Continue optimal blood pressure control.  Follow-up echo in the fall 2022.  4. HLD: LDL 118 in 10/2019 with normal LFT at that time.  She remains on atorvastatin.  Disposition: F/u with Dr. Saunders Revel or an APP in 8 weeks.   Medication Adjustments/Labs and Tests Ordered: Current medicines are reviewed at length with the patient today.  Concerns regarding medicines are outlined above. Medication changes, Labs and Tests ordered today are summarized above and listed in the Patient Instructions accessible in Encounters.   Signed, [name], PA-C 07/15/2020 9:28 AM     [facility] [address] [place], [place] [address] [phone]

## 2020-07-15 ENCOUNTER — Encounter: Payer: Self-pay | Admitting: Physician Assistant

## 2020-07-15 ENCOUNTER — Other Ambulatory Visit: Payer: Self-pay

## 2020-07-15 ENCOUNTER — Ambulatory Visit (INDEPENDENT_AMBULATORY_CARE_PROVIDER_SITE_OTHER): Payer: Medicare Other | Admitting: Physician Assistant

## 2020-07-15 ENCOUNTER — Ambulatory Visit (INDEPENDENT_AMBULATORY_CARE_PROVIDER_SITE_OTHER): Payer: Medicare Other

## 2020-07-15 VITALS — BP 142/68 | HR 60 | Ht 62.0 in | Wt 139.0 lb

## 2020-07-15 DIAGNOSIS — R55 Syncope and collapse: Secondary | ICD-10-CM

## 2020-07-15 DIAGNOSIS — I38 Endocarditis, valve unspecified: Secondary | ICD-10-CM

## 2020-07-15 DIAGNOSIS — I10 Essential (primary) hypertension: Secondary | ICD-10-CM | POA: Diagnosis not present

## 2020-07-15 DIAGNOSIS — E782 Mixed hyperlipidemia: Secondary | ICD-10-CM | POA: Diagnosis not present

## 2020-07-15 MED ORDER — LOSARTAN POTASSIUM 50 MG PO TABS: 75.0000 mg | ORAL_TABLET | Freq: Every day | ORAL | 2 refills | Status: DC

## 2020-07-15 NOTE — Patient Instructions (Addendum)
Medication Instructions:  - Your physician has recommended you make the following change in your medication:   1)  STOP hydralazine  2) INCREASE losartan 50 mg- take 1.5 tablets (75 mg) by mouth once daily   *If you need a refill on your cardiac medications before your next appointment, please call your pharmacy*   Lab Work: - none ordered  If you have labs (blood work) drawn today and your tests are completely normal, you will receive your results only by: Marland Kitchen MyChart Message (if you have MyChart) OR . A paper copy in the mail If you have any lab test that is abnormal or we need to change your treatment, we will call you to review the results.   Testing/Procedures: 1) Heart Monitor x 14 days: (07/15/20- 07/29/20)   Your physician has recommended that you wear a Zio AT "real time" heart monitor x 14 days (placed in office today). This monitor is a medical device that records the heart's electrical activity. Doctors most often use these monitors to diagnose arrhythmias. Arrhythmias are problems with the speed or rhythm of the heartbeat. The monitor is a small device applied to your chest. You can wear one while you do your normal daily activities. While wearing this monitor if you have any symptoms to push the button and record what you felt. Once you have worn this monitor for the period of time provider prescribed (Usually 14 days), you will return the monitor device in the postage paid box. Once it is returned they will download the data collected and provide [place] with a report which the provider will then review and we will call you with those results. Important tips:  1. Avoid showering during the first 24 hours of wearing the monitor. 2. Avoid excessive sweating to help maximize wear time. 3. Do not submerge the device, no hot tubs, and no swimming pools. 4. Keep any lotions or oils away from the patch. 5. After 24 hours you may shower with the patch on. Take brief showers with your back  facing the shower head.  6. Do not remove patch once it has been placed because that will interrupt data and decrease adhesive wear time. 7. Push the button when you have any symptoms and write down what you were feeling. 8. Once you have completed wearing your monitor, remove and place into box which has postage paid and place in your outgoing mailbox.  9. If for some reason you have misplaced your box then call our office and we can provide another box and/or mail it off for you.         Follow-Up: At [facility], you and your health needs are our priority.  As part of our continuing mission to provide you with exceptional heart care, we have created designated Provider Care Teams.  These Care Teams include your primary Cardiologist (physician) and Advanced Practice Providers (APPs -  Physician Assistants and Nurse Practitioners) who all work together to provide you with the care you need, when you need it.  We recommend signing up for the patient portal called "MyChart".  Sign up information is provided on this After Visit Summary.  MyChart is used to connect with patients for Virtual Visits (Telemedicine).  Patients are able to view lab/test results, encounter notes, upcoming appointments, etc.  Non-urgent messages can be sent to your provider as well.   To learn more about what you can do with MyChart, go to [email or web address].    Your next appointment:  8 week(s)  The format for your next appointment:   In Person  Provider:   You may see Nelva Bush, MD or one of the following Advanced Practice Providers on your designated Care Team:    Murray Hodgkins, NP  [name], PA-C  Marrianne Mood, PA-C  Cadence Blair, [place]  Laurann Montana, NP    Other Instructions n/a

## 2020-07-16 DIAGNOSIS — R55 Syncope and collapse: Secondary | ICD-10-CM | POA: Diagnosis not present

## 2020-07-17 ENCOUNTER — Encounter: Payer: Self-pay | Admitting: Hospice and Palliative Medicine

## 2020-07-26 DIAGNOSIS — H35352 Cystoid macular degeneration, left eye: Secondary | ICD-10-CM | POA: Diagnosis not present

## 2020-08-04 ENCOUNTER — Telehealth: Payer: Self-pay | Admitting: *Deleted

## 2020-08-04 NOTE — Telephone Encounter (Signed)
-----   Message from Rise Mu, PA-C sent at 08/04/2020  3:57 PM EST ----- Outpatient cardiac monitoring showed a predominant rhythm of sinus (normal) with an average heart rate of 66 bpm with a range of 47 at 5:47 AM to 120 bpm in sinus). There was a 7 beat run of extra beats from the bottom portion of the heart. There were 157 episodes of SVT (fast heart beats from the top portion of the heart) with the longest episode lasting 26.5 seconds. Occasional extra beats from the top portion of the heart and rare extra beats from the bottom portion of the heart, which are common and not significant. There were no patient triggered events.   Recommendations: -Keep follow up appointment next month to reassess symptoms and to discuss if any medication changes are needed based on the above, given these episodes were not triggered/felt

## 2020-08-04 NOTE — Telephone Encounter (Signed)
The patient has been notified of the result and verbalized understanding.  All questions (if any) were answered. Pt will follow up with Dr. Saunders Revel as scheduled 09/09/20.

## 2020-08-06 DIAGNOSIS — M059 Rheumatoid arthritis with rheumatoid factor, unspecified: Secondary | ICD-10-CM | POA: Diagnosis not present

## 2020-08-06 DIAGNOSIS — H35352 Cystoid macular degeneration, left eye: Secondary | ICD-10-CM | POA: Diagnosis not present

## 2020-08-27 ENCOUNTER — Other Ambulatory Visit: Payer: Self-pay

## 2020-08-27 DIAGNOSIS — I10 Essential (primary) hypertension: Secondary | ICD-10-CM

## 2020-09-02 ENCOUNTER — Other Ambulatory Visit: Payer: Self-pay | Admitting: Hospice and Palliative Medicine

## 2020-09-02 ENCOUNTER — Other Ambulatory Visit: Payer: Self-pay | Admitting: Physician Assistant

## 2020-09-02 DIAGNOSIS — E559 Vitamin D deficiency, unspecified: Secondary | ICD-10-CM

## 2020-09-06 DIAGNOSIS — M069 Rheumatoid arthritis, unspecified: Secondary | ICD-10-CM | POA: Diagnosis not present

## 2020-09-06 DIAGNOSIS — H35352 Cystoid macular degeneration, left eye: Secondary | ICD-10-CM | POA: Diagnosis not present

## 2020-09-08 ENCOUNTER — Other Ambulatory Visit: Payer: Self-pay

## 2020-09-08 MED ORDER — ONETOUCH DELICA LANCETS 30G MISC: 1 refills | Status: DC

## 2020-09-09 ENCOUNTER — Other Ambulatory Visit: Payer: Self-pay

## 2020-09-09 ENCOUNTER — Encounter: Payer: Self-pay | Admitting: Internal Medicine

## 2020-09-09 ENCOUNTER — Ambulatory Visit (INDEPENDENT_AMBULATORY_CARE_PROVIDER_SITE_OTHER): Payer: Medicare Other | Admitting: Internal Medicine

## 2020-09-09 VITALS — BP 110/56 | HR 55 | Ht 62.0 in | Wt 138.0 lb

## 2020-09-09 DIAGNOSIS — I471 Supraventricular tachycardia: Secondary | ICD-10-CM | POA: Insufficient documentation

## 2020-09-09 DIAGNOSIS — I10 Essential (primary) hypertension: Secondary | ICD-10-CM | POA: Diagnosis not present

## 2020-09-09 DIAGNOSIS — I38 Endocarditis, valve unspecified: Secondary | ICD-10-CM | POA: Diagnosis not present

## 2020-09-09 DIAGNOSIS — R55 Syncope and collapse: Secondary | ICD-10-CM

## 2020-09-09 NOTE — Patient Instructions (Signed)
Medication Instructions:  Your physician recommends that you continue on your current medications as directed. Please refer to the Current Medication list given to you today.  *If you need a refill on your cardiac medications before your next appointment, please call your pharmacy*   Lab Work: None ordered If you have labs (blood work) drawn today and your tests are completely normal, you will receive your results only by: Marland Kitchen MyChart Message (if you have MyChart) OR . A paper copy in the mail If you have any lab test that is abnormal or we need to change your treatment, we will call you to review the results.   Testing/Procedures: None ordered   Follow-Up: At [facility], you and your health needs are our priority.  As part of our continuing mission to provide you with exceptional heart care, we have created designated Provider Care Teams.  These Care Teams include your primary Cardiologist (physician) and Advanced Practice Providers (APPs -  Physician Assistants and Nurse Practitioners) who all work together to provide you with the care you need, when you need it.  We recommend signing up for the patient portal called "MyChart".  Sign up information is provided on this After Visit Summary.  MyChart is used to connect with patients for Virtual Visits (Telemedicine).  Patients are able to view lab/test results, encounter notes, upcoming appointments, etc.  Non-urgent messages can be sent to your provider as well.   To learn more about what you can do with MyChart, go to [email or web address].    Your next appointment:   6 month(s)  The format for your next appointment:   In Person  Provider:   You may see Nelva Bush, MD or one of the following Advanced Practice Providers on your designated Care Team:    Murray Hodgkins, NP  [name], PA-C  Marrianne Mood, PA-C  Cadence Mullins, [place]  Laurann Montana, NP    Other Instructions You have been referred to EP Dr.  Quentin Ore

## 2020-09-09 NOTE — Progress Notes (Signed)
Follow-up Outpatient Visit Date: 09/09/2020  Primary Care Provider: Lavera Guise, MD [address] [place] [address]  Chief Complaint: Follow-up syncope and abnormal event monitor  HPI:  [name] is a 80 y.o. female with history of valvular heart disease (mild to moderate MR, AI, and TR as well as mildAS),hypertension, hyperlipidemia, type 2 diabetes mellitus, rheumatoid arthritis, and anemia, who presents for follow-up of syncope.  She was last seen in mid February by Becky Faith, PA, at which time she reported a syncopal episode in the late December.  14-day event monitor was placed for further evaluation.  Office blood pressure was mildly elevated at that time.  Hydralazine was discontinued and losartan titrated to 75 mg daily.  Today, [name] reports that she has been feeling well.  She has not had any further syncopal episodes.  She notes 1 episode of palpitations while lying in bed while she was wearing the event monitor.  She otherwise has not had any significant palpitations.  She also denies chest pain, shortness of breath, lightheadedness, and edema.  She is tolerating her medications well.  She is not checking her blood pressure regularly at home.  --------------------------------------------------------------------------------------------------  Cardiovascular History & Procedures: Cardiovascular Problems:  Mixed valvular heart disease  Risk Factors:  Hypertension, diabetes mellitus, and age > 6  Cath/PCI:  None  CV Surgery:  None  EP Procedures and Devices:  Predominately sinus rhythm with occasional PACs and rare PVCs.  157 episodes of PSVT were observed, lasting up to 26.5 seconds with a maximum rate of 205 bpm.  Short run of NSVT was also noted.  Non-Invasive Evaluation(s):  TTE (01/24/2019): Small left ventricle with moderate LVH. Hyperdynamic contraction (LVEF greater than 70%). Mild aortic valve calcification with mild stenosis and  moderate regurgitation. Moderate mitral annular calcification with mild regurgitation. Mild tricuspid regurgitation. Mild pulmonary hypertension.   Recent CV Pertinent Labs: Lab Results  Component Value Date   CHOL 207 (H) 10/31/2019   CHOL 228 (H) 07/25/2018   HDL 79 10/31/2019   HDL 92 07/25/2018   LDLCALC 118 (H) 10/31/2019   LDLCALC 120 (H) 07/25/2018   TRIG 49 10/31/2019   CHOLHDL 2.6 10/31/2019   K 3.7 05/25/2020   BUN 28 (H) 05/25/2020   BUN 21 06/11/2019   CREATININE 1.16 (H) 05/25/2020    Past medical and surgical history were reviewed and updated in EPIC.  Current Meds  Medication Sig  . acetaminophen (TYLENOL) 500 MG tablet Take 500-1,000 mg by mouth every 6 (six) hours as needed.  . Alcohol Swabs (ALCOHOL PREP) PADS alcohol prep pads  . amLODipine (NORVASC) 5 MG tablet Take 2 tablets (10 mg total) by mouth every evening.  Marland Kitchen aspirin EC 81 MG tablet Take 1 tablet (81 mg total) by mouth daily.  Marland Kitchen atorvastatin (LIPITOR) 10 MG tablet TAKE 1 TABLET AT BEDTIME   FOR HIGH CHOLESTEROL  . Ferrous Gluconate (IRON 27 PO) Take 27 mg by mouth daily.   . folic acid (FOLVITE) 1 MG tablet Take 2 tablets (2 mg total) by mouth daily.  Marland Kitchen glimepiride (AMARYL) 4 MG tablet Take 1/2 tablet with breakfast and 1/2 tablet with dinner  . glucose blood (ONETOUCH VERIO) test strip Use as directed  For twice a day diag e11.65  . hydrochlorothiazide (HYDRODIURIL) 25 MG tablet Take 1 tablet (25 mg total) by mouth daily.  Marland Kitchen losartan (COZAAR) 50 MG tablet Take 1.5 tablets (75 mg total) by mouth daily.  . methotrexate 2.5 MG tablet  Take 15 mg by mouth once a week.   . neomycin-polymyxin b-dexamethasone (MAXITROL) 3.5-10000-0.1 OINT SMARTSIG:Application Left Eye 4 Times Daily PRN  . OneTouch Delica Lancets 16X MISC Use as directed  twice a daily  Dx E11.65  . pantoprazole (PROTONIX) 40 MG tablet TAKE 1 TABLET DAILY FOR    REFLUX  . prednisoLONE acetate (PRED FORTE) 1 % ophthalmic suspension  Administer 1 drop into the left eye four (4) times a day.  . Vitamin D, Ergocalciferol, (DRISDOL) 1.25 MG (50000 UNIT) CAPS capsule TAKE 1 CAPSULE BY MOUTH  ONCE WEEKLY    Allergies: Patient has no known allergies.  Social History   Tobacco Use  . Smoking status: Never Smoker  . Smokeless tobacco: Never Used  Vaping Use  . Vaping Use: Never used  Substance Use Topics  . Alcohol use: No  . Drug use: No    Family History  Problem Relation Age of Onset  . Hypertension Mother   . Diabetes Father   . Heart disease Brother 75       Open heart surgery (details unknown)  . Breast cancer Neg Hx     Review of Systems: A 12-system review of systems was performed and was negative except as noted in the HPI.  --------------------------------------------------------------------------------------------------  Physical Exam: BP (!) 110/56 (BP Location: Left Arm, Patient Position: Sitting, Cuff Size: Normal)   Pulse (!) 55   Ht 5\' 2"  (1.575 m)   Wt 138 lb (62.6 kg)   SpO2 98%   BMI 25.24 kg/m   General:  NAD.  Accompanied by her son. Neck: No JVD or HJR. Lungs: Clear to auscultation bilaterally without wheezes or crackles. Heart: Bradycardic but regular with 3/6 systolic murmur.  No rubs or gallops. Abdomen: Soft, nontender, nondistended. Extremities: No lower extremity edema.   Lab Results  Component Value Date   WBC 4.9 10/31/2019   HGB 11.4 (L) 10/31/2019   HCT 34.6 (L) 10/31/2019   MCV 86.5 10/31/2019   PLT 163 10/31/2019    Lab Results  Component Value Date   NA 137 05/25/2020   K 3.7 05/25/2020   CL 99 05/25/2020   CO2 29 05/25/2020   BUN 28 (H) 05/25/2020   CREATININE 1.16 (H) 05/25/2020   GLUCOSE 230 (H) 05/25/2020   ALT 15 10/31/2019    Lab Results  Component Value Date   CHOL 207 (H) 10/31/2019   HDL 79 10/31/2019   LDLCALC 118 (H) 10/31/2019   TRIG 49 10/31/2019   CHOLHDL 2.6 10/31/2019   Lab Results  Component Value Date   TSH 1.856  10/31/2019    --------------------------------------------------------------------------------------------------  ASSESSMENT AND PLAN: Syncope and PSVT: No further syncopal episodes reported.  Given event monitor showing frequent runs of SVT lasting almost 30 seconds, I am concerned that her syncopal episode in December may have been triggered by a tacky arrhythmia.  I have advised her to continue to refrain from driving.  We will refer her to Dr. Quentin Ore (EP) for further discussion.  I am reluctant to add a beta-blocker given her low resting heart rate.  TSH should be checked when Ms. Salvador has labs at her upcoming appointment with Dr. Humphrey Rolls, though I suspect that hyperthyroidism is not driving her PSVT given normal TSH in 10/2019 and resting bradycardia.  Valvular heart disease: No signs or symptoms of heart failure.  Echo in 02/2020 showed stable aortic sclerosis and mild mitral regurgitation with mild pulmonary hypertension.  Continue clinical follow-up.  Hypertension: Blood pressure borderline  low today albeit asymptomatic.  Continue current regimen for now, though we may need to consider de-escalation in the future if blood pressure continues to trend down or symptoms develop.  I have asked Ms. Scheffel to monitor her blood pressure at home, if possible.  Follow-up: Return to clinic in 6 months.  Nelva Bush, MD 09/09/2020 8:26 AM

## 2020-10-04 DIAGNOSIS — L600 Ingrowing nail: Secondary | ICD-10-CM | POA: Diagnosis not present

## 2020-10-04 DIAGNOSIS — M2011 Hallux valgus (acquired), right foot: Secondary | ICD-10-CM | POA: Diagnosis not present

## 2020-10-04 DIAGNOSIS — E119 Type 2 diabetes mellitus without complications: Secondary | ICD-10-CM | POA: Diagnosis not present

## 2020-10-04 DIAGNOSIS — M2012 Hallux valgus (acquired), left foot: Secondary | ICD-10-CM | POA: Diagnosis not present

## 2020-10-04 DIAGNOSIS — B351 Tinea unguium: Secondary | ICD-10-CM | POA: Diagnosis not present

## 2020-10-04 DIAGNOSIS — Z79899 Other long term (current) drug therapy: Secondary | ICD-10-CM | POA: Diagnosis not present

## 2020-10-04 DIAGNOSIS — M79674 Pain in right toe(s): Secondary | ICD-10-CM | POA: Diagnosis not present

## 2020-10-04 DIAGNOSIS — M79675 Pain in left toe(s): Secondary | ICD-10-CM | POA: Diagnosis not present

## 2020-10-04 DIAGNOSIS — M0579 Rheumatoid arthritis with rheumatoid factor of multiple sites without organ or systems involvement: Secondary | ICD-10-CM | POA: Diagnosis not present

## 2020-10-06 ENCOUNTER — Encounter: Payer: Self-pay | Admitting: Cardiology

## 2020-10-06 ENCOUNTER — Other Ambulatory Visit: Payer: Self-pay

## 2020-10-06 ENCOUNTER — Telehealth: Payer: Self-pay

## 2020-10-06 ENCOUNTER — Ambulatory Visit (INDEPENDENT_AMBULATORY_CARE_PROVIDER_SITE_OTHER): Payer: Medicare Other | Admitting: Cardiology

## 2020-10-06 VITALS — BP 171/64 | HR 56 | Ht 62.0 in | Wt 137.0 lb

## 2020-10-06 DIAGNOSIS — I471 Supraventricular tachycardia: Secondary | ICD-10-CM

## 2020-10-06 DIAGNOSIS — R55 Syncope and collapse: Secondary | ICD-10-CM

## 2020-10-06 DIAGNOSIS — I10 Essential (primary) hypertension: Secondary | ICD-10-CM | POA: Diagnosis not present

## 2020-10-06 MED ORDER — LOSARTAN POTASSIUM 100 MG PO TABS: 100.0000 mg | ORAL_TABLET | Freq: Every day | ORAL | 3 refills | Status: DC

## 2020-10-06 NOTE — Patient Instructions (Addendum)
Medication Instructions:  Your physician has recommended you make the following change in your medication:   1.  INCREASE your losartan-  Take 100 mg by mouth daily  *If you need a refill on your cardiac medications before your next appointment, please call your pharmacy*  Lab Work: None ordered. If you have labs (blood work) drawn today and your tests are completely normal, you will receive your results only by: Marland Kitchen MyChart Message (if you have MyChart) OR . A paper copy in the mail If you have any lab test that is abnormal or we need to change your treatment, we will call you to review the results.  Testing/Procedures: None ordered.  Follow-Up: At [facility], you and your health needs are our priority.  As part of our continuing mission to provide you with exceptional heart care, we have created designated Provider Care Teams.  These Care Teams include your primary Cardiologist (physician) and Advanced Practice Providers (APPs -  Physician Assistants and Nurse Practitioners) who all work together to provide you with the care you need, when you need it.  Your next appointment:   Your physician wants you to follow-up in: as needed with Dr. Quentin Ore.

## 2020-10-06 NOTE — Progress Notes (Signed)
Electrophysiology Office Note:    Date:  10/06/2020   ID:  [name], DOB [birth year], MRN [id number]  PCP:  Lavera Guise, MD  [facility] Cardiologist:  Nelva Bush, MD  [facility] Electrophysiologist:  Vickie Epley, MD   Referring MD: Nelva Bush, MD   Chief Complaint: Syncope  History of Present Illness:    [name] is a 80 y.o. female who presents for an evaluation of syncope at the request of Dr. Saunders Revel.  Patient last saw Dr. Saunders Revel September 09, 2020.  The patient has a history of mild to moderate mitral regurgitation, aortic insufficiency and tricuspid regurgitation, hypertension, hyperlipidemia, diabetes, rheumatoid arthritis and anemia.  Patient did wear a Holter monitor which showed frequent episodes of SVT lasting 30 seconds.  She is unable to tolerate beta-blocker due to resting bradycardia.  She is with her son today in clinic. She tells me that in December she had a syncopal episode while she was in her kitchen.  She tells me that while she was washing dishes she began to feel warm.  The warmth intensified and she got sweaty.  She then decided that she needed to go to the restroom so she was walking to the restroom and getting more lightheaded.  She then remembers waking up with her son helping her.  She felt like her normal self after the episode.  She has not had a recurrent episode since that day.  She did not feel palpitations around the time/day of the syncopal episode.  She was worked up with a Holter monitor.  During the time she wore the Holter monitor, she did feel some palpitations but did not feel any lightheadedness during the 14-day monitoring.   Past Medical History:  Diagnosis Date  . Anemia   . Arthritis   . Diabetes mellitus without complication ([place])   . GERD (gastroesophageal reflux disease)   . Heart murmur   . Hyperlipidemia   . Hypertension   . Leaky heart valve   . Rheumatoid arthritis ([place])   . Wears dentures     Past  Surgical History:  Procedure Laterality Date  . ABDOMINAL HYSTERECTOMY    . ANTERIOR VITRECTOMY Left 03/10/2020   Procedure: ANTERIOR VITRECTOMY;  Surgeon: Leandrew Koyanagi, MD;  Location: [place];  Service: Ophthalmology;  Laterality: Left;  . CATARACT EXTRACTION    . CATARACT EXTRACTION W/PHACO Left 03/10/2020   Procedure: CATARACT EXTRACTION PHACO  (IOC) LEFT  DIABETIC 16.27  02:09.7  12.5%;  Surgeon: Leandrew Koyanagi, MD;  Location: [place];  Service: Ophthalmology;  Laterality: Left;  Diabetic - oral meds  . COLONOSCOPY WITH PROPOFOL N/A 10/25/2017   Procedure: COLONOSCOPY WITH PROPOFOL;  Surgeon: Lollie Sails, MD;  Location: [facility] ENDOSCOPY;  Service: Endoscopy;  Laterality: N/A;  . ESOPHAGOGASTRODUODENOSCOPY (EGD) WITH PROPOFOL N/A 10/25/2017   Procedure: ESOPHAGOGASTRODUODENOSCOPY (EGD) WITH PROPOFOL;  Surgeon: Lollie Sails, MD;  Location: [facility] ENDOSCOPY;  Service: Endoscopy;  Laterality: N/A;  . ESOPHAGOGASTRODUODENOSCOPY (EGD) WITH PROPOFOL N/A 03/04/2019   Procedure: ESOPHAGOGASTRODUODENOSCOPY (EGD) WITH PROPOFOL;  Surgeon: Lollie Sails, MD;  Location: [facility] ENDOSCOPY;  Service: Endoscopy;  Laterality: N/A;  . EUS N/A 01/17/2018   Procedure: ESOPHAGEAL ENDOSCOPIC ULTRASOUND (EUS) RADIAL;  Surgeon: Reita Cliche, MD;  Location: [facility] ENDOSCOPY;  Service: Gastroenterology;  Laterality: N/A;  . right side had fatty tissue taken off    . TONSILLECTOMY      Current Medications: Current Meds  Medication Sig  . acetaminophen (TYLENOL)  500 MG tablet Take 500-1,000 mg by mouth every 6 (six) hours as needed.  . Alcohol Swabs (ALCOHOL PREP) PADS alcohol prep pads  . amLODipine (NORVASC) 5 MG tablet Take 2 tablets (10 mg total) by mouth every evening.  Marland Kitchen aspirin EC 81 MG tablet Take 1 tablet (81 mg total) by mouth daily.  Marland Kitchen atorvastatin (LIPITOR) 10 MG tablet TAKE 1 TABLET AT BEDTIME   FOR HIGH CHOLESTEROL  . Ferrous Gluconate (IRON 27 PO) Take  27 mg by mouth daily.   . folic acid (FOLVITE) 1 MG tablet Take 2 tablets (2 mg total) by mouth daily.  Marland Kitchen glimepiride (AMARYL) 4 MG tablet Take 1/2 tablet with breakfast and 1/2 tablet with dinner  . glucose blood (ONETOUCH VERIO) test strip Use as directed  For twice a day diag e11.65  . hydrochlorothiazide (HYDRODIURIL) 25 MG tablet Take 1 tablet (25 mg total) by mouth daily.  Marland Kitchen losartan (COZAAR) 50 MG tablet Take 1.5 tablets (75 mg total) by mouth daily.  . methotrexate 2.5 MG tablet Take 15 mg by mouth once a week.   . neomycin-polymyxin b-dexamethasone (MAXITROL) 3.5-10000-0.1 OINT SMARTSIG:Application Left Eye 4 Times Daily PRN  . OneTouch Delica Lancets 25Z MISC Use as directed  twice a daily  Dx E11.65  . pantoprazole (PROTONIX) 40 MG tablet TAKE 1 TABLET DAILY FOR    REFLUX  . prednisoLONE acetate (PRED FORTE) 1 % ophthalmic suspension Administer 1 drop into the left eye four (4) times a day.  . Vitamin D, Ergocalciferol, (DRISDOL) 1.25 MG (50000 UNIT) CAPS capsule TAKE 1 CAPSULE BY MOUTH  ONCE WEEKLY     Allergies:   Patient has no known allergies.   Social History   Socioeconomic History  . Marital status: Widowed    Spouse name: Not on file  . Number of children: Not on file  . Years of education: Not on file  . Highest education level: Not on file  Occupational History  . Not on file  Tobacco Use  . Smoking status: Never Smoker  . Smokeless tobacco: Never Used  Vaping Use  . Vaping Use: Never used  Substance and Sexual Activity  . Alcohol use: No  . Drug use: No  . Sexual activity: Not on file  Other Topics Concern  . Not on file  Social History Narrative  . Not on file   Social Determinants of Health   Financial Resource Strain: Not on file  Food Insecurity: Not on file  Transportation Needs: Not on file  Physical Activity: Not on file  Stress: Not on file  Social Connections: Not on file     Family History: The patient's family history includes  Diabetes in her father; Heart disease (age of onset: 63) in her brother; Hypertension in her mother. There is no history of Breast cancer.  ROS:   Please see the history of present illness.    All other systems reviewed and are negative.  EKGs/Labs/Other Studies Reviewed:    The following studies were reviewed today:  August 04, 2020 Holter personally reviewed 157 episodes of supraventricular tachycardia, up to 30 seconds 7 beat run of nonsustained ventricular tachycardia 1.8% burden of PACs Rare PVCs     EKG:  The ekg ordered today demonstrates sinus bradycardia with a ventricular rate of 56 bpm.  Recent Labs: 10/31/2019: ALT 15; Hemoglobin 11.4; Platelets 163; TSH 1.856 05/25/2020: BUN 28; Creatinine, Ser 1.16; Potassium 3.7; Sodium 137  Recent Lipid Panel    Component Value Date/Time  CHOL 207 (H) 10/31/2019 0906   CHOL 228 (H) 07/25/2018 1624   TRIG 49 10/31/2019 0906   HDL 79 10/31/2019 0906   HDL 92 07/25/2018 1624   CHOLHDL 2.6 10/31/2019 0906   VLDL 10 10/31/2019 0906   LDLCALC 118 (H) 10/31/2019 0906   LDLCALC 120 (H) 07/25/2018 1624    Physical Exam:    VS:  BP (!) 171/64   Pulse (!) 56   Ht 5\' 2"  (1.575 m)   Wt 137 lb (62.1 kg)   BMI 25.06 kg/m     Wt Readings from Last 3 Encounters:  10/06/20 137 lb (62.1 kg)  09/09/20 138 lb (62.6 kg)  07/15/20 139 lb (63 kg)     GEN:  Well nourished, well developed in no acute distress HEENT: Normal NECK: No JVD; No carotid bruits LYMPHATICS: No lymphadenopathy CARDIAC: Regular rhythm, bradycardic, no murmurs, rubs, gallops RESPIRATORY:  Clear to auscultation without rales, wheezing or rhonchi  ABDOMEN: Soft, non-tender, non-distended MUSCULOSKELETAL:  No edema; No deformity  SKIN: Warm and dry NEUROLOGIC:  Alert and oriented x 3 PSYCHIATRIC:  Normal affect   ASSESSMENT:    1. Vasovagal syncope   2. Essential hypertension   3. SVT (supraventricular tachycardia) (HCC)    PLAN:    In order of problems  listed above:  1. Syncope Her history is consistent with a vasovagal syncope episode.  She is thankfully not had another episode since her December episode.  I have reviewed her recent heart monitoring which shows no evidence of malignant arrhythmias or any rhythm that would contribute to a syncopal episode.  From a cardiovascular perspective, I do not have a reason to tell her to not drive although her son and the patient endorses some visual changes for which she is seeing an ophthalmologist which for right now continue to prevent her safe operation of a car.  I have encouraged her to not operate a vehicle until she is cleared from a vision perspective to return to driving.  I have advised her that if she experiences a recurrent syncopal episode she is to stop driving immediately and contact our office.  2.  Hypertension Above goal today.  She should continue home checks and continue her current regimen of amlodipine, hydrochlorothiazide, losartan.  She should increase her losartan to 100 mg by mouth once daily.  3.  SVT Asymptomatic.  Review of the tracings from her ZIO monitor suggest this is an atrial tachycardia.  I spent an extensive amount of time with the patient and her son today reviewing the pathophysiology of her SVT and management options.  I do not think pharmacologic therapy for this is indicated at this time especially given her resting bradycardia.  If she were to develop symptomatic palpitations, she should contact our office for further evaluation and possible treatment.  I do not think/suspect her SVT noted on her ZIO monitor contributed to her syncopal episode.  Follow-up as needed.  Total time spent with patient today 65 minutes. This includes reviewing records, evaluating the patient and coordinating care.  Medication Adjustments/Labs and Tests Ordered: Current medicines are reviewed at length with the patient today.  Concerns regarding medicines are outlined above.  Orders  Placed This Encounter  Procedures  . EKG 12-Lead   No orders of the defined types were placed in this encounter.    Signed, Hilton Cork. Quentin Ore, MD, [facility], [facility] 10/06/2020 11:37 AM    Electrophysiology [facility]

## 2020-10-06 NOTE — Telephone Encounter (Signed)
Left detailed message per DPR.  Advised Pt should increase her losartan to 100 mg daily.  Sent new prescription to pharmacy.  Will attempt to call again to ensure Pt understands directions.

## 2020-10-06 NOTE — Telephone Encounter (Signed)
Call back received from Pt.  Confirmed Pt understood medication change.  No further questions.

## 2020-10-14 DIAGNOSIS — E119 Type 2 diabetes mellitus without complications: Secondary | ICD-10-CM | POA: Diagnosis not present

## 2020-10-14 DIAGNOSIS — Z79899 Other long term (current) drug therapy: Secondary | ICD-10-CM | POA: Diagnosis not present

## 2020-10-14 DIAGNOSIS — M25561 Pain in right knee: Secondary | ICD-10-CM | POA: Diagnosis not present

## 2020-10-14 DIAGNOSIS — M0579 Rheumatoid arthritis with rheumatoid factor of multiple sites without organ or systems involvement: Secondary | ICD-10-CM | POA: Diagnosis not present

## 2020-10-14 DIAGNOSIS — G8929 Other chronic pain: Secondary | ICD-10-CM | POA: Diagnosis not present

## 2020-10-18 ENCOUNTER — Other Ambulatory Visit: Payer: Self-pay

## 2020-10-18 DIAGNOSIS — M069 Rheumatoid arthritis, unspecified: Secondary | ICD-10-CM | POA: Diagnosis not present

## 2020-10-18 DIAGNOSIS — H35352 Cystoid macular degeneration, left eye: Secondary | ICD-10-CM | POA: Diagnosis not present

## 2020-10-18 MED ORDER — LOSARTAN POTASSIUM 100 MG PO TABS
100.0000 mg | ORAL_TABLET | Freq: Every day | ORAL | 3 refills | Status: DC
Start: 2020-10-18 — End: 2021-06-08

## 2020-10-21 ENCOUNTER — Encounter: Payer: Self-pay | Admitting: Physician Assistant

## 2020-10-21 ENCOUNTER — Other Ambulatory Visit: Payer: Self-pay

## 2020-10-21 ENCOUNTER — Ambulatory Visit (INDEPENDENT_AMBULATORY_CARE_PROVIDER_SITE_OTHER): Payer: Medicare Other | Admitting: Physician Assistant

## 2020-10-21 ENCOUNTER — Encounter: Payer: Medicare HMO | Admitting: Hospice and Palliative Medicine

## 2020-10-21 DIAGNOSIS — I10 Essential (primary) hypertension: Secondary | ICD-10-CM | POA: Diagnosis not present

## 2020-10-21 DIAGNOSIS — I38 Endocarditis, valve unspecified: Secondary | ICD-10-CM

## 2020-10-21 DIAGNOSIS — Z124 Encounter for screening for malignant neoplasm of cervix: Secondary | ICD-10-CM | POA: Diagnosis not present

## 2020-10-21 DIAGNOSIS — R300 Dysuria: Secondary | ICD-10-CM

## 2020-10-21 DIAGNOSIS — Z0001 Encounter for general adult medical examination with abnormal findings: Secondary | ICD-10-CM | POA: Diagnosis not present

## 2020-10-21 DIAGNOSIS — E1165 Type 2 diabetes mellitus with hyperglycemia: Secondary | ICD-10-CM | POA: Diagnosis not present

## 2020-10-21 DIAGNOSIS — R5383 Other fatigue: Secondary | ICD-10-CM

## 2020-10-21 DIAGNOSIS — Z87898 Personal history of other specified conditions: Secondary | ICD-10-CM

## 2020-10-21 DIAGNOSIS — E559 Vitamin D deficiency, unspecified: Secondary | ICD-10-CM | POA: Diagnosis not present

## 2020-10-21 LAB — POCT GLYCOSYLATED HEMOGLOBIN (HGB A1C): Hemoglobin A1C: 6.6 % — AB (ref 4.0–5.6)

## 2020-10-21 NOTE — Progress Notes (Signed)
[facility] [place], [place] [address]  Internal MEDICINE  Office Visit Note  Patient Name: [name]  [id number]  [id number]  Date of Service: 10/21/2020  Chief Complaint  Patient presents with  . Medicare Wellness  . Diabetes  . Gastroesophageal Reflux  . Hyperlipidemia  . Hypertension  . Anemia     HPI Pt is here for routine health maintenance examination -Had recent surgery on left eye via [facility], she will follow with them at that end of June. Eye is not painful but still blurry -Losartan increased to 100mg  by cardiology but has not taken any of her meds today since she has not eaten yet today. -Syncopal episode in Dec 2021 and none again since. Cardiology is following this and did a heart monitor for 14 days. Found episodes of SVT. No BB given because of resting bradycardia. Was cleared to drive from cardiology perspective however discussed still not driving after she and son discussed her vision changes and will not drive unless cleared from ophthalmology standpoint and only if no recurrence of syncope. She will continue to follow up with cardiology. -She is taking drisdol. -foot exam done today. BG at home: 130-140, takes 1/2 to 1 tab glimepiride in the AM depending on BG readings -Will have routine labs done  Current Medication: Outpatient Encounter Medications as of 10/21/2020  Medication Sig  . acetaminophen (TYLENOL) 500 MG tablet Take 500-1,000 mg by mouth every 6 (six) hours as needed.  . Alcohol Swabs (ALCOHOL PREP) PADS alcohol prep pads  . amLODipine (NORVASC) 5 MG tablet Take 2 tablets (10 mg total) by mouth every evening.  Marland Kitchen aspirin EC 81 MG tablet Take 1 tablet (81 mg total) by mouth daily.  Marland Kitchen atorvastatin (LIPITOR) 10 MG tablet TAKE 1 TABLET AT BEDTIME   FOR HIGH CHOLESTEROL  . Ferrous Gluconate (IRON 27 PO) Take 27 mg by mouth daily.   . folic acid (FOLVITE) 1 MG tablet Take 2 tablets (2 mg total) by mouth daily.  Marland Kitchen  glimepiride (AMARYL) 4 MG tablet Take 1/2 tablet with breakfast and 1/2 tablet with dinner  . glucose blood (ONETOUCH VERIO) test strip Use as directed  For twice a day diag e11.65  . hydrochlorothiazide (HYDRODIURIL) 25 MG tablet Take 1 tablet (25 mg total) by mouth daily.  Marland Kitchen losartan (COZAAR) 100 MG tablet Take 1 tablet (100 mg total) by mouth daily.  . methotrexate 2.5 MG tablet Take 15 mg by mouth once a week.   . neomycin-polymyxin b-dexamethasone (MAXITROL) 3.5-10000-0.1 OINT SMARTSIG:Application Left Eye 4 Times Daily PRN  . OneTouch Delica Lancets 46E MISC Use as directed  twice a daily  Dx E11.65  . pantoprazole (PROTONIX) 40 MG tablet TAKE 1 TABLET DAILY FOR    REFLUX  . prednisoLONE acetate (PRED FORTE) 1 % ophthalmic suspension Administer 1 drop into the left eye four (4) times a day.  . Vitamin D, Ergocalciferol, (DRISDOL) 1.25 MG (50000 UNIT) CAPS capsule TAKE 1 CAPSULE BY MOUTH  ONCE WEEKLY   No facility-administered encounter medications on file as of 10/21/2020.    Surgical History: Past Surgical History:  Procedure Laterality Date  . ABDOMINAL HYSTERECTOMY    . ANTERIOR VITRECTOMY Left 03/10/2020   Procedure: ANTERIOR VITRECTOMY;  Surgeon: Leandrew Koyanagi, MD;  Location: [place];  Service: Ophthalmology;  Laterality: Left;  . CATARACT EXTRACTION    . CATARACT EXTRACTION W/PHACO Left 03/10/2020   Procedure: CATARACT EXTRACTION PHACO  (IOC) LEFT  DIABETIC 16.27  02:09.7  12.5%;  Surgeon: Leandrew Koyanagi, MD;  Location: [place];  Service: Ophthalmology;  Laterality: Left;  Diabetic - oral meds  . COLONOSCOPY WITH PROPOFOL N/A 10/25/2017   Procedure: COLONOSCOPY WITH PROPOFOL;  Surgeon: Lollie Sails, MD;  Location: [facility] ENDOSCOPY;  Service: Endoscopy;  Laterality: N/A;  . ESOPHAGOGASTRODUODENOSCOPY (EGD) WITH PROPOFOL N/A 10/25/2017   Procedure: ESOPHAGOGASTRODUODENOSCOPY (EGD) WITH PROPOFOL;  Surgeon: Lollie Sails, MD;  Location:  [facility] ENDOSCOPY;  Service: Endoscopy;  Laterality: N/A;  . ESOPHAGOGASTRODUODENOSCOPY (EGD) WITH PROPOFOL N/A 03/04/2019   Procedure: ESOPHAGOGASTRODUODENOSCOPY (EGD) WITH PROPOFOL;  Surgeon: Lollie Sails, MD;  Location: [facility] ENDOSCOPY;  Service: Endoscopy;  Laterality: N/A;  . EUS N/A 01/17/2018   Procedure: ESOPHAGEAL ENDOSCOPIC ULTRASOUND (EUS) RADIAL;  Surgeon: Reita Cliche, MD;  Location: [facility] ENDOSCOPY;  Service: Gastroenterology;  Laterality: N/A;  . right side had fatty tissue taken off    . TONSILLECTOMY      Medical History: Past Medical History:  Diagnosis Date  . Anemia   . Arthritis   . Diabetes mellitus without complication ([place])   . GERD (gastroesophageal reflux disease)   . Heart murmur   . Hyperlipidemia   . Hypertension   . Leaky heart valve   . Rheumatoid arthritis ([place])   . Wears dentures     Family History: Family History  Problem Relation Age of Onset  . Hypertension Mother   . Diabetes Father   . Heart disease Brother 1       Open heart surgery (details unknown)  . Breast cancer Neg Hx       Review of Systems  Constitutional: Negative for chills, fatigue and unexpected weight change.  HENT: Negative for congestion, postnasal drip, rhinorrhea, sneezing and sore throat.   Eyes: Positive for visual disturbance. Negative for pain and redness.       L eye blurry still following eye surgerry  Respiratory: Negative for cough, chest tightness and shortness of breath.   Cardiovascular: Negative for chest pain and palpitations.  Gastrointestinal: Negative for abdominal pain, constipation, diarrhea, nausea and vomiting.  Genitourinary: Negative for dysuria and frequency.  Musculoskeletal: Negative for arthralgias, back pain, joint swelling and neck pain.  Skin: Negative for rash.  Neurological: Negative.  Negative for dizziness, tremors, light-headedness and numbness.  Hematological: Negative for adenopathy. Does not bruise/bleed easily.   Psychiatric/Behavioral: Negative for behavioral problems (Depression), sleep disturbance and suicidal ideas. The patient is not nervous/anxious.      Vital Signs: BP (!) 160/68   Pulse 65   Temp (!) 97.2 F (36.2 C)   Resp 16   Ht 5\' 2"  (1.575 m)   Wt 137 lb 6.4 oz (62.3 kg)   SpO2 98%   BMI 25.13 kg/m    Physical Exam Vitals and nursing note reviewed.  Constitutional:      General: She is not in acute distress.    Appearance: She is well-developed and normal weight. She is not diaphoretic.  HENT:     Head: Normocephalic and atraumatic.     Right Ear: External ear normal.     Left Ear: External ear normal.     Nose: Nose normal.     Mouth/Throat:     Pharynx: No oropharyngeal exudate.  Eyes:     General: No scleral icterus.       Right eye: No discharge.        Left eye: No discharge.     Conjunctiva/sclera: Conjunctivae normal.     Pupils:  Pupils are equal, round, and reactive to light.  Neck:     Thyroid: No thyromegaly.     Vascular: No JVD.     Trachea: No tracheal deviation.  Cardiovascular:     Rate and Rhythm: Normal rate and regular rhythm.     Pulses:          Dorsalis pedis pulses are 3+ on the right side and 3+ on the left side.       Posterior tibial pulses are 3+ on the right side and 3+ on the left side.     Heart sounds: Normal heart sounds. No murmur heard. No friction rub. No gallop.   Pulmonary:     Effort: Pulmonary effort is normal. No respiratory distress.     Breath sounds: Normal breath sounds. No stridor. No wheezing or rales.  Chest:     Chest wall: No tenderness.  Breasts:     Right: Normal. No mass.     Left: Normal. No mass.    Abdominal:     General: Bowel sounds are normal. There is no distension.     Palpations: Abdomen is soft. There is no mass.     Tenderness: There is no abdominal tenderness. There is no guarding or rebound.  Musculoskeletal:        General: No tenderness or deformity. Normal range of motion.      Cervical back: Normal range of motion and neck supple.     Right foot: Normal range of motion.     Left foot: Normal range of motion.  Feet:     Right foot:     Protective Sensation: 2 sites tested. 2 sites sensed.     Skin integrity: Skin integrity normal.     Toenail Condition: Right toenails are normal.     Left foot:     Protective Sensation: 2 sites tested. 2 sites sensed.     Skin integrity: Skin integrity normal.     Toenail Condition: Left toenails are normal.  Lymphadenopathy:     Cervical: No cervical adenopathy.  Skin:    General: Skin is warm and dry.     Coloration: Skin is not pale.     Findings: No erythema or rash.  Neurological:     Mental Status: She is alert.     Cranial Nerves: No cranial nerve deficit.     Motor: No abnormal muscle tone.     Coordination: Coordination normal.     Deep Tendon Reflexes: Reflexes are normal and symmetric.  Psychiatric:        Behavior: Behavior normal.        Thought Content: Thought content normal.        Judgment: Judgment normal.      LABS: Recent Results (from the past 2160 hour(s))  POCT HgB A1C     Status: Abnormal   Collection Time: 10/21/20 10:01 AM  Result Value Ref Range   Hemoglobin A1C 6.6 (A) 4.0 - 5.6 %   HbA1c POC (<> result, manual entry)     HbA1c, POC (prediabetic range)     HbA1c, POC (controlled diabetic range)         Assessment/Plan: 1. Encounter for general adult medical examination with abnormal findings Colonoscopy performed in 2019, had a mammogram in 2021 and patient not interested in continuing mammograms at this time. We will order routine labs  2. Type 2 diabetes mellitus with hyperglycemia, without long-term current use of insulin (HCC) - POCT HgB A1C 6.6 today  we will continue with half to full tab of glimepiride as indicated by morning blood sugar. Continue to improve diet and exercise  3. Essential hypertension Elevated in office today however patient has not yet had her  medications this morning.  Blood pressure medications were recently changed with an increase to losartan 100 mg by cardiology who is following her blood pressure  4. Valvular heart disease Followed by cardiology  5. History of syncope Followed by cardiology, patient has had no repeat syncopal episodes since December 2021  6. Vitamin D deficiency - VITAMIN D 25 Hydroxy (Vit-D Deficiency, Fractures)  7. Other fatigue - CBC w/Diff/Platelet - Comprehensive metabolic panel - Lipid Panel With LDL/HDL Ratio - TSH + free T4 - VITAMIN D 25 Hydroxy (Vit-D Deficiency, Fractures)  8. Dysuria - UA/M w/rflx Culture, Routine   General Counseling: [name] verbalizes understanding of the findings of todays visit and agrees with plan of treatment. I have discussed any further diagnostic evaluation that may be needed or ordered today. We also reviewed her medications today. she has been encouraged to call the office with any questions or concerns that should arise related to todays visit.    Counseling:    Orders Placed This Encounter  Procedures  . UA/M w/rflx Culture, Routine  . CBC w/Diff/Platelet  . Comprehensive metabolic panel  . Lipid Panel With LDL/HDL Ratio  . TSH + free T4  . VITAMIN D 25 Hydroxy (Vit-D Deficiency, Fractures)  . POCT HgB A1C    No orders of the defined types were placed in this encounter.   This patient was seen by Drema Dallas, PA-C in collaboration with Dr. Clayborn Bigness as a part of collaborative care agreement.  Total time spent:35 Minutes  Time spent includes review of chart, medications, test results, and follow up plan with the patient.     Lavera Guise, MD  Internal Medicine

## 2020-10-22 LAB — MICROSCOPIC EXAMINATION
Bacteria, UA: NONE SEEN
Casts: NONE SEEN /lpf
Epithelial Cells (non renal): NONE SEEN /hpf (ref 0–10)
RBC, Urine: NONE SEEN /hpf (ref 0–2)
WBC, UA: NONE SEEN /hpf (ref 0–5)

## 2020-10-22 LAB — UA/M W/RFLX CULTURE, ROUTINE
Bilirubin, UA: NEGATIVE
Glucose, UA: NEGATIVE
Ketones, UA: NEGATIVE
Leukocytes,UA: NEGATIVE
Nitrite, UA: NEGATIVE
RBC, UA: NEGATIVE
Specific Gravity, UA: 1.016 (ref 1.005–1.030)
Urobilinogen, Ur: 1 mg/dL (ref 0.2–1.0)
pH, UA: 6 (ref 5.0–7.5)

## 2020-11-03 ENCOUNTER — Other Ambulatory Visit: Payer: Self-pay | Admitting: Nurse Practitioner

## 2020-11-09 ENCOUNTER — Other Ambulatory Visit: Payer: Self-pay

## 2020-11-09 MED ORDER — ONETOUCH DELICA LANCETS 30G MISC: 1 refills | Status: DC

## 2020-11-22 DIAGNOSIS — M069 Rheumatoid arthritis, unspecified: Secondary | ICD-10-CM | POA: Diagnosis not present

## 2020-11-22 DIAGNOSIS — H35352 Cystoid macular degeneration, left eye: Secondary | ICD-10-CM | POA: Diagnosis not present

## 2020-11-22 DIAGNOSIS — H43821 Vitreomacular adhesion, right eye: Secondary | ICD-10-CM | POA: Diagnosis not present

## 2020-12-27 DIAGNOSIS — H35352 Cystoid macular degeneration, left eye: Secondary | ICD-10-CM | POA: Diagnosis not present

## 2021-01-11 ENCOUNTER — Other Ambulatory Visit: Payer: Self-pay | Admitting: Gastroenterology

## 2021-01-11 DIAGNOSIS — K5909 Other constipation: Secondary | ICD-10-CM | POA: Diagnosis not present

## 2021-01-11 DIAGNOSIS — K219 Gastro-esophageal reflux disease without esophagitis: Secondary | ICD-10-CM

## 2021-01-11 DIAGNOSIS — K3189 Other diseases of stomach and duodenum: Secondary | ICD-10-CM | POA: Diagnosis not present

## 2021-01-14 DIAGNOSIS — Z79899 Other long term (current) drug therapy: Secondary | ICD-10-CM | POA: Diagnosis not present

## 2021-01-14 DIAGNOSIS — M0579 Rheumatoid arthritis with rheumatoid factor of multiple sites without organ or systems involvement: Secondary | ICD-10-CM | POA: Diagnosis not present

## 2021-01-18 ENCOUNTER — Ambulatory Visit
Admission: RE | Admit: 2021-01-18 | Discharge: 2021-01-18 | Disposition: A | Discharge status: Home / Self Care | Payer: Medicare Other | Source: Ambulatory Visit | Attending: Gastroenterology | Admitting: Gastroenterology

## 2021-01-18 ENCOUNTER — Other Ambulatory Visit: Payer: Self-pay

## 2021-01-18 DIAGNOSIS — K219 Gastro-esophageal reflux disease without esophagitis: Secondary | ICD-10-CM | POA: Diagnosis not present

## 2021-01-20 ENCOUNTER — Other Ambulatory Visit: Payer: Self-pay | Admitting: Physician Assistant

## 2021-01-20 DIAGNOSIS — I10 Essential (primary) hypertension: Secondary | ICD-10-CM

## 2021-01-21 ENCOUNTER — Encounter: Payer: Self-pay | Admitting: Nurse Practitioner

## 2021-01-21 ENCOUNTER — Ambulatory Visit: Payer: Medicare Other | Admitting: Physician Assistant

## 2021-01-21 ENCOUNTER — Other Ambulatory Visit: Payer: Self-pay

## 2021-01-21 ENCOUNTER — Ambulatory Visit (INDEPENDENT_AMBULATORY_CARE_PROVIDER_SITE_OTHER): Payer: Medicare Other | Admitting: Nurse Practitioner

## 2021-01-21 VITALS — BP 135/60 | HR 55 | Temp 97.1°F | Resp 16 | Ht 62.0 in | Wt 135.8 lb

## 2021-01-21 DIAGNOSIS — M0579 Rheumatoid arthritis with rheumatoid factor of multiple sites without organ or systems involvement: Secondary | ICD-10-CM

## 2021-01-21 DIAGNOSIS — E1165 Type 2 diabetes mellitus with hyperglycemia: Secondary | ICD-10-CM

## 2021-01-21 DIAGNOSIS — K219 Gastro-esophageal reflux disease without esophagitis: Secondary | ICD-10-CM

## 2021-01-21 LAB — POCT GLYCOSYLATED HEMOGLOBIN (HGB A1C): Hemoglobin A1C: 6.5 % — AB (ref 4.0–5.6)

## 2021-01-21 MED ORDER — FOLIC ACID 1 MG PO TABS: 2.0000 mg | ORAL_TABLET | Freq: Every day | ORAL | 1 refills | Status: DC

## 2021-01-21 MED ORDER — GLIMEPIRIDE 4 MG PO TABS: ORAL_TABLET | ORAL | 3 refills | Status: DC

## 2021-01-21 MED ORDER — PANTOPRAZOLE SODIUM 40 MG PO TBEC: DELAYED_RELEASE_TABLET | ORAL | 2 refills | Status: DC

## 2021-01-21 NOTE — Progress Notes (Signed)
[facility] [place], [place] [address]  Internal MEDICINE  Office Visit Note  Patient Name: [name]  [id number]  [id number]  Date of Service: 01/21/2021  Chief Complaint  Patient presents with   Follow-up   Diabetes   Gastroesophageal Reflux   Hyperlipidemia   Hypertension   Anemia    HPI [name] presents for a follow up visit for diabetes and A1C check. She also has a history of GERD, hypertension, anemia and hyperlipidemia. She has slight improvement in her A1C today at 6.5. Her last A1C was 6.6 in may 2022.  -She has a history of rheumatoid arthritis, followed by rheumatology, takes methotrexate weekly. She is taking 2 mg folic acid daily to mitigate side effects of methotrexate and needs a refill of the folic acid.  -takes pantoprazole for GERD, works well for her.   Current Medication: Outpatient Encounter Medications as of 01/21/2021  Medication Sig   acetaminophen (TYLENOL) 500 MG tablet Take 500-1,000 mg by mouth every 6 (six) hours as needed.   Alcohol Swabs (ALCOHOL PREP) PADS alcohol prep pads   amLODipine (NORVASC) 5 MG tablet Take 2 tablets (10 mg total) by mouth every evening.   aspirin EC 81 MG tablet Take 1 tablet (81 mg total) by mouth daily.   atorvastatin (LIPITOR) 10 MG tablet TAKE 1 TABLET AT BEDTIME   FOR HIGH CHOLESTEROL   Difluprednate 0.05 % EMUL Place 1 drop into the left eye 4 (four) times daily.   Ferrous Gluconate (IRON 27 PO) Take 27 mg by mouth daily.    glucose blood (ONETOUCH VERIO) test strip Use as directed  For twice a day diag e11.65   hydrochlorothiazide (HYDRODIURIL) 25 MG tablet Take 1 tablet (25 mg total) by mouth daily.   losartan (COZAAR) 100 MG tablet Take 1 tablet (100 mg total) by mouth daily.   methotrexate 2.5 MG tablet Take 15 mg by mouth once a week.    neomycin-polymyxin b-dexamethasone (MAXITROL) 3.5-10000-0.1 OINT SMARTSIG:Application Left Eye 4 Times Daily PRN   OneTouch Delica Lancets [id number]  MISC Use as directed  twice a daily  Dx E11.65   prednisoLONE acetate (PRED FORTE) 1 % ophthalmic suspension Administer 1 drop into the left eye four (4) times a day.   Vitamin D, Ergocalciferol, (DRISDOL) 1.25 MG (50000 UNIT) CAPS capsule TAKE 1 CAPSULE BY MOUTH  ONCE WEEKLY   [DISCONTINUED] folic acid (FOLVITE) 1 MG tablet Take 2 tablets (2 mg total) by mouth daily.   [DISCONTINUED] glimepiride (AMARYL) 4 MG tablet Take 1/2 tablet with breakfast and 1/2 tablet with dinner   [DISCONTINUED] pantoprazole (PROTONIX) 40 MG tablet TAKE 1 TABLET DAILY FOR    REFLUX   folic acid (FOLVITE) 1 MG tablet Take 2 tablets (2 mg total) by mouth daily.   glimepiride (AMARYL) 4 MG tablet Take 1/2 tablet with breakfast and 1/2 tablet with dinner   pantoprazole (PROTONIX) 40 MG tablet TAKE 1 TABLET DAILY FOR    REFLUX   No facility-administered encounter medications on file as of 01/21/2021.    Surgical History: Past Surgical History:  Procedure Laterality Date   ABDOMINAL HYSTERECTOMY     ANTERIOR VITRECTOMY Left 03/10/2020   Procedure: ANTERIOR VITRECTOMY;  Surgeon: Leandrew Koyanagi, MD;  Location: [place];  Service: Ophthalmology;  Laterality: Left;   CATARACT EXTRACTION     CATARACT EXTRACTION W/PHACO Left 03/10/2020   Procedure: CATARACT EXTRACTION PHACO  (IOC) LEFT  DIABETIC 16.27  02:09.7  12.5%;  Surgeon: Wallace Going,  Nila Nephew, MD;  Location: [place];  Service: Ophthalmology;  Laterality: Left;  Diabetic - oral meds   COLONOSCOPY WITH PROPOFOL N/A 10/25/2017   Procedure: COLONOSCOPY WITH PROPOFOL;  Surgeon: Lollie Sails, MD;  Location: [facility] ENDOSCOPY;  Service: Endoscopy;  Laterality: N/A;   ESOPHAGOGASTRODUODENOSCOPY (EGD) WITH PROPOFOL N/A 10/25/2017   Procedure: ESOPHAGOGASTRODUODENOSCOPY (EGD) WITH PROPOFOL;  Surgeon: Lollie Sails, MD;  Location[facility] ENDOSCOPY;  Service: Endoscopy;  Laterality: N/A;   ESOPHAGOGASTRODUODENOSCOPY (EGD) WITH PROPOFOL N/A  03/04/2019   Procedure: ESOPHAGOGASTRODUODENOSCOPY (EGD) WITH PROPOFOL;  Surgeon: Lollie Sails, MD;  Location: [facility] ENDOSCOPY;  Service: Endoscopy;  Laterality: N/A;   EUS N/A 01/17/2018   Procedure: ESOPHAGEAL ENDOSCOPIC ULTRASOUND (EUS) RADIAL;  Surgeon: Reita Cliche, MD;  Location: [facility] ENDOSCOPY;  Service: Gastroenterology;  Laterality: N/A;   right side had fatty tissue taken off     TONSILLECTOMY      Medical History: Past Medical History:  Diagnosis Date   Anemia    Arthritis    Diabetes mellitus without complication (HCC)    GERD (gastroesophageal reflux disease)    Heart murmur    Hyperlipidemia    Hypertension    Leaky heart valve    Rheumatoid arthritis (HCC)    Wears dentures     Family History: Family History  Problem Relation Age of Onset   Hypertension Mother    Diabetes Father    Heart disease Brother 12       Open heart surgery (details unknown)   Breast cancer Neg Hx     Social History   Socioeconomic History   Marital status: Widowed    Spouse name: Not on file   Number of children: Not on file   Years of education: Not on file   Highest education level: Not on file  Occupational History   Not on file  Tobacco Use   Smoking status: Never   Smokeless tobacco: Never  Vaping Use   Vaping Use: Never used  Substance and Sexual Activity   Alcohol use: No   Drug use: No   Sexual activity: Not on file  Other Topics Concern   Not on file  Social History Narrative   Not on file   Social Determinants of Health   Financial Resource Strain: Not on file  Food Insecurity: Not on file  Transportation Needs: Not on file  Physical Activity: Not on file  Stress: Not on file  Social Connections: Not on file  Intimate Partner Violence: Not on file      Review of Systems  Constitutional:  Negative for chills, fatigue and unexpected weight change.  HENT:  Negative for congestion, rhinorrhea, sneezing and sore throat.   Eyes:  Negative for  redness.  Respiratory:  Negative for cough, chest tightness and shortness of breath.   Cardiovascular:  Negative for chest pain and palpitations.  Gastrointestinal:  Negative for abdominal pain, constipation, diarrhea, nausea and vomiting.  Genitourinary:  Negative for dysuria and frequency.  Musculoskeletal:  Negative for arthralgias, back pain, joint swelling and neck pain.  Skin:  Negative for rash.  Neurological: Negative.  Negative for tremors and numbness.  Hematological:  Negative for adenopathy. Does not bruise/bleed easily.  Psychiatric/Behavioral:  Negative for behavioral problems (Depression), sleep disturbance and suicidal ideas. The patient is not nervous/anxious.    Vital Signs: BP 135/60   Pulse (!) 55   Temp (!) 97.1 F (36.2 C)   Resp 16   Ht '5\' 2"'$  (1.575 m)  Wt 135 lb 12.8 oz (61.6 kg)   SpO2 97%   BMI 24.84 kg/m    Physical Exam Constitutional:      General: She is not in acute distress.    Appearance: Normal appearance. She is normal weight. She is not ill-appearing.  HENT:     Head: Normocephalic and atraumatic.  Eyes:     Extraocular Movements: Extraocular movements intact.     Pupils: Pupils are equal, round, and reactive to light.  Cardiovascular:     Rate and Rhythm: Normal rate and regular rhythm.  Pulmonary:     Effort: Pulmonary effort is normal. No respiratory distress.  Neurological:     Mental Status: She is alert and oriented to person, place, and time.     Cranial Nerves: No cranial nerve deficit.     Coordination: Coordination normal.     Gait: Gait normal.  Psychiatric:        Mood and Affect: Mood normal.        Behavior: Behavior normal.     Assessment/Plan: 1. Type 2 diabetes mellitus with hyperglycemia, without long-term current use of insulin (HCC) Slight improvement in A1C, recheck A1C in 6 months - POCT HgB A1C - glimepiride (AMARYL) 4 MG tablet; Take 1/2 tablet with breakfast and 1/2 tablet with dinner  Dispense: 90  tablet; Refill: 3  2. Rheumatoid arthritis involving multiple sites with positive rheumatoid factor ([place]) Followed by rheumatology, Folic acid refill ordered - folic acid (FOLVITE) 1 MG tablet; Take 2 tablets (2 mg total) by mouth daily.  Dispense: 90 tablet; Refill: 1  3. Gastroesophageal reflux disease without esophagitis Stable with current medication, refill ordered.  - pantoprazole (PROTONIX) 40 MG tablet; TAKE 1 TABLET DAILY FOR    REFLUX  Dispense: 90 tablet; Refill: 2   General Counseling: Camey verbalizes understanding of the findings of todays visit and agrees with plan of treatment. I have discussed any further diagnostic evaluation that may be needed or ordered today. We also reviewed her medications today. she has been encouraged to call the office with any questions or concerns that should arise related to todays visit.    Orders Placed This Encounter  Procedures   POCT HgB A1C    Meds ordered this encounter  Medications   folic acid (FOLVITE) 1 MG tablet    Sig: Take 2 tablets (2 mg total) by mouth daily.    Dispense:  90 tablet    Refill:  1   glimepiride (AMARYL) 4 MG tablet    Sig: Take 1/2 tablet with breakfast and 1/2 tablet with dinner    Dispense:  90 tablet    Refill:  3   pantoprazole (PROTONIX) 40 MG tablet    Sig: TAKE 1 TABLET DAILY FOR    REFLUX    Dispense:  90 tablet    Refill:  2    Return in about 6 months (around 07/24/2021) for F/U, Recheck A1C, Darnell Jeschke PCP.   Total time spent:30 Minutes Time spent includes review of chart, medications, test results, and follow up plan with the patient.   [place] Controlled Substance Database was reviewed by me.  This patient was seen by Jonetta Osgood, FNP-C in collaboration with Dr. Clayborn Bigness as a part of collaborative care agreement.   Jaquari Reckner R. Valetta Fuller, MSN, FNP-C Internal medicine

## 2021-01-25 DIAGNOSIS — H35352 Cystoid macular degeneration, left eye: Secondary | ICD-10-CM | POA: Diagnosis not present

## 2021-02-18 ENCOUNTER — Other Ambulatory Visit: Payer: Self-pay | Admitting: Internal Medicine

## 2021-02-18 DIAGNOSIS — Z1231 Encounter for screening mammogram for malignant neoplasm of breast: Secondary | ICD-10-CM

## 2021-03-04 ENCOUNTER — Other Ambulatory Visit: Payer: Self-pay

## 2021-03-04 ENCOUNTER — Ambulatory Visit
Admission: RE | Admit: 2021-03-04 | Discharge: 2021-03-04 | Disposition: A | Discharge status: Home / Self Care | Payer: Medicare Other | Source: Ambulatory Visit | Attending: Internal Medicine | Admitting: Internal Medicine

## 2021-03-04 DIAGNOSIS — Z1231 Encounter for screening mammogram for malignant neoplasm of breast: Secondary | ICD-10-CM | POA: Insufficient documentation

## 2021-03-22 DIAGNOSIS — H35352 Cystoid macular degeneration, left eye: Secondary | ICD-10-CM | POA: Diagnosis not present

## 2021-03-25 ENCOUNTER — Other Ambulatory Visit: Payer: Self-pay | Admitting: Nurse Practitioner

## 2021-03-25 ENCOUNTER — Other Ambulatory Visit: Payer: Self-pay | Admitting: Internal Medicine

## 2021-03-25 DIAGNOSIS — M0579 Rheumatoid arthritis with rheumatoid factor of multiple sites without organ or systems involvement: Secondary | ICD-10-CM

## 2021-03-25 NOTE — Telephone Encounter (Signed)
LVM to schedule appt

## 2021-03-25 NOTE — Telephone Encounter (Signed)
Please contact pt for future appointment. Pt due for 6 month f/u. 

## 2021-03-28 NOTE — Telephone Encounter (Signed)
Scheduled

## 2021-04-14 DIAGNOSIS — G8929 Other chronic pain: Secondary | ICD-10-CM | POA: Diagnosis not present

## 2021-04-14 DIAGNOSIS — M25561 Pain in right knee: Secondary | ICD-10-CM | POA: Diagnosis not present

## 2021-04-14 DIAGNOSIS — M0579 Rheumatoid arthritis with rheumatoid factor of multiple sites without organ or systems involvement: Secondary | ICD-10-CM | POA: Diagnosis not present

## 2021-04-14 DIAGNOSIS — Z79899 Other long term (current) drug therapy: Secondary | ICD-10-CM | POA: Diagnosis not present

## 2021-04-14 DIAGNOSIS — E119 Type 2 diabetes mellitus without complications: Secondary | ICD-10-CM | POA: Diagnosis not present

## 2021-04-18 ENCOUNTER — Ambulatory Visit: Payer: Medicare Other | Admitting: Physician Assistant

## 2021-04-18 ENCOUNTER — Telehealth: Payer: Self-pay | Admitting: Internal Medicine

## 2021-04-18 NOTE — Progress Notes (Deleted)
Cardiology Office Note    Date:  04/18/2021   ID:  [name], DOB 12-14-1940, MRN [id number]  PCP:  Becky Osgood, NP  Cardiologist:  Becky Bush, MD  Electrophysiologist:  Becky Epley, MD   Chief Complaint: Follow-up  History of Present Illness:   [name] is a 80 y.o. female with history of valvular heart disease including mild to moderate mitral regurgitation, aortic insufficiency, tricuspid regurgitation, and mild aortic stenosis. She also has a history of DM2, HTN, HLD, RA, and anemia who presents for follow up of ***.    Prior echo, obtained through outside office, showed a hyperdynamic LVSF with an EF > 70%, moderate LVH, small left ventricle, normal RVSF and ventricular cavity size, mild AS, moderate AI, mild MR, moderate TR, mild pulmonary hypertension. She has since been followed at our [facility] office, noting stable exertional dyspnea and sporadic "twitches" of randomly occurring chest pain.   She underwent echo in 02/2020 for follow-up of her valvular heart disease which demonstrated an EF of 60 to 65%, no regional wall motion abnormalities, grade 1 diastolic dysfunction, mild LVH, normal RV systolic function and ventricular cavity size, mildly elevated PASP at 42.2 mmHg, mild mitral regurgitation, mild aortic valve insufficiency, mild to moderate aortic valve sclerosis without evidence of stenosis.  When compared to prior echo, her valvular heart disease was stable and her right-sided pressure was improved.  She was seen in 06/2020, initially for medication reconciliation, however at that visit she reported a syncopal episode on 05/28/2020.  Subsequent outpatient cardiac monitoring demonstrated a predominant rhythm of sinus with an average rate of 66 bpm, occasional PACs representing a 1.8% burden, rare PVCs, a single 7 beat run of NSVT, and 157 episodes of SVT lasting up to 26.5 seconds.  There were no patient triggered events.  Beta-blocker was deferred  given resting bradycardic rates noted at times.  She was subsequently referred to EP with syncopal episode felt to be vasovagal in etiology.  From a cardiac perspective, there was no clear indication to restrict her driving.         Labs independently reviewed: 03/2021 - Hgb 10.6, PLT 189, AST/ALT normal, serum creatinine 1.1, albumin 3.9 12/2020 - A1c 6.5 04/2020 - potassium 3.7, BUN 28, serum creatinine 1.16 10/2019 - TSH normal, TC 207, TG 49, HDL 79, LDL 118   Past Medical History:  Diagnosis Date   Anemia    Arthritis    Diabetes mellitus without complication (HCC)    GERD (gastroesophageal reflux disease)    Heart murmur    Hyperlipidemia    Hypertension    Leaky heart valve    Rheumatoid arthritis ([place])    Wears dentures     Past Surgical History:  Procedure Laterality Date   ABDOMINAL HYSTERECTOMY     ANTERIOR VITRECTOMY Left 03/10/2020   Procedure: ANTERIOR VITRECTOMY;  Surgeon: Becky Koyanagi, MD;  Location: [place];  Service: Ophthalmology;  Laterality: Left;   CATARACT EXTRACTION     CATARACT EXTRACTION W/PHACO Left 03/10/2020   Procedure: CATARACT EXTRACTION PHACO  (IOC) LEFT  DIABETIC 16.27  02:09.7  12.5%;  Surgeon: Becky Koyanagi, MD;  Location: [place];  Service: Ophthalmology;  Laterality: Left;  Diabetic - oral meds   COLONOSCOPY WITH PROPOFOL N/A 10/25/2017   Procedure: COLONOSCOPY WITH PROPOFOL;  Surgeon: Becky Sails, MD;  Location: [facility] ENDOSCOPY;  Service: Endoscopy;  Laterality: N/A;   ESOPHAGOGASTRODUODENOSCOPY (EGD) WITH PROPOFOL N/A 10/25/2017   Procedure: ESOPHAGOGASTRODUODENOSCOPY (EGD)  WITH PROPOFOL;  Surgeon: Becky Sails, MD;  Location: [facility] ENDOSCOPY;  Service: Endoscopy;  Laterality: N/A;   ESOPHAGOGASTRODUODENOSCOPY (EGD) WITH PROPOFOL N/A 03/04/2019   Procedure: ESOPHAGOGASTRODUODENOSCOPY (EGD) WITH PROPOFOL;  Surgeon: Becky Sails, MD;  Location: [facility] ENDOSCOPY;  Service: Endoscopy;   Laterality: N/A;   EUS N/A 01/17/2018   Procedure: ESOPHAGEAL ENDOSCOPIC ULTRASOUND (EUS) RADIAL;  Surgeon: Becky Cliche, MD;  Location: [facility] ENDOSCOPY;  Service: Gastroenterology;  Laterality: N/A;   right side had fatty tissue taken off     TONSILLECTOMY      Current Medications: No outpatient medications have been marked as taking for the 04/18/21 encounter (Appointment) with Rise Mu, PA-C.    Allergies:   Patient has no known allergies.   Social History   Socioeconomic History   Marital status: Widowed    Spouse name: Not on file   Number of children: Not on file   Years of education: Not on file   Highest education level: Not on file  Occupational History   Not on file  Tobacco Use   Smoking status: Never   Smokeless tobacco: Never  Vaping Use   Vaping Use: Never used  Substance and Sexual Activity   Alcohol use: No   Drug use: No   Sexual activity: Not on file  Other Topics Concern   Not on file  Social History Narrative   Not on file   Social Determinants of Health   Financial Resource Strain: Not on file  Food Insecurity: Not on file  Transportation Needs: Not on file  Physical Activity: Not on file  Stress: Not on file  Social Connections: Not on file     Family History:  The patient's family history includes Diabetes in her father; Heart disease (age of onset: 54) in her brother; Hypertension in her mother. There is no history of Breast cancer.  ROS:   ROS   EKGs/Labs/Other Studies Reviewed:    Studies reviewed were summarized above. The additional studies were reviewed today:  Zio patch 06/2020: The patient was monitored for 14 days. The predominant rhythm was sinus with an average rate of 66 bpm (range 47 - 120 bpm in sinus). There were occasional PAC's (1.8% burden) and rare PVC's. A single 7-beat run of nonsustained ventricular tachycardia occurred with a maximum rate of 152 bpm. There were 157 episodes of supraventricular  tachycardia lasting up to 26.5 seconds with a maximum rate of 207 bpm. There were no patient triggered events.   Predominantly sinus rhythm with occasional PAC and rare PVC's.  157 episodes of PSVT occurred, as well as one short run of NSVT. __________  2D echo 02/2020: 1. Left ventricular ejection fraction, by estimation, is 60 to 65%. The  left ventricle has normal function. The left ventricle has no regional  wall motion abnormalities. There is mild left ventricular hypertrophy.  Left ventricular diastolic parameters  are consistent with Grade I diastolic dysfunction (impaired relaxation).   2. Right ventricular systolic function is normal. The right ventricular  size is normal. There is mildly elevated pulmonary artery systolic  pressure. The estimated right ventricular systolic pressure is 82.5 mmHg.   3. Mild mitral valve regurgitation.   4. The aortic valve is normal in structure. Aortic valve regurgitation is  mild. Mild to moderate aortic valve sclerosis/calcification is present,  without any evidence of aortic stenosis. __________   2D echo 12/2018 (outside office): Hyperdynamic EF > 70%, moderate LVH with small left ventricle, mild aortic valve  calcification with mild stenosis and moderate regurgitation, moderate mitral annular calcification with mild regurgitation, mild tricuspid regurgitation, mild pulmonary hypertension    EKG:  EKG is ordered today.  The EKG ordered today demonstrates ***  Recent Labs: 05/25/2020: BUN 28; Creatinine, Ser 1.16; Potassium 3.7; Sodium 137  Recent Lipid Panel    Component Value Date/Time   CHOL 207 (H) 10/31/2019 0906   CHOL 228 (H) 07/25/2018 1624   TRIG 49 10/31/2019 0906   HDL 79 10/31/2019 0906   HDL 92 07/25/2018 1624   CHOLHDL 2.6 10/31/2019 0906   VLDL 10 10/31/2019 0906   LDLCALC 118 (H) 10/31/2019 0906   LDLCALC 120 (H) 07/25/2018 1624    PHYSICAL EXAM:    VS:  There were no vitals taken for this visit.  BMI: There is  no height or weight on file to calculate BMI.  Physical Exam  Wt Readings from Last 3 Encounters:  01/21/21 135 lb 12.8 oz (61.6 kg)  10/21/20 137 lb 6.4 oz (62.3 kg)  10/06/20 137 lb (62.1 kg)     ASSESSMENT & PLAN:   Syncope: Felt to be vasovagal in etiology.  Paroxysmal SVT: Evaluated by EP with no indication for pharmacologic therapy, especially given her resting bradycardia.  HTN: Blood pressure ***  Valvular heart disease:  HLD: LDL 118 in 10/2019 with normal LFT at that time.   {Are you ordering a CV Procedure (e.g. stress test, cath, DCCV, TEE, etc)?   Press F2        :[id number]}     Disposition: F/u with Dr. Saunders Revel or an APP in ***, and EP as directed.   Medication Adjustments/Labs and Tests Ordered: Current medicines are reviewed at length with the patient today.  Concerns regarding medicines are outlined above. Medication changes, Labs and Tests ordered today are summarized above and listed in the Patient Instructions accessible in Encounters.   Signed, [name], PA-C 04/18/2021 6:32 AM     [facility] [address] [place], [place] [address] [phone]

## 2021-04-18 NOTE — Telephone Encounter (Signed)
*  STAT* If patient is at the pharmacy, call can be transferred to refill team.   1. Which medications need to be refilled? (please list name of each medication and dose if known) ALL CARDIAC MEDS   2. Which pharmacy/location (including street and city if local pharmacy) is medication to be sent to? [facility]   3. Do they need a 30 day or 90 day supply? [place]

## 2021-04-18 NOTE — Telephone Encounter (Signed)
All cardiac meds sent to [facility] with refills. Spoke with patient's son Lennette Bihari who expressed verbal understanding. He states he will contact [facility] to get refilled.

## 2021-05-03 DIAGNOSIS — H35352 Cystoid macular degeneration, left eye: Secondary | ICD-10-CM | POA: Diagnosis not present

## 2021-05-17 ENCOUNTER — Other Ambulatory Visit: Payer: Self-pay

## 2021-05-17 DIAGNOSIS — E559 Vitamin D deficiency, unspecified: Secondary | ICD-10-CM

## 2021-05-17 MED ORDER — VITAMIN D (ERGOCALCIFEROL) 1.25 MG (50000 UNIT) PO CAPS: 50000.0000 [IU] | ORAL_CAPSULE | ORAL | 3 refills | Status: DC

## 2021-06-05 NOTE — Progress Notes (Deleted)
Cardiology Office Note:    Date:  06/05/2021   ID:  [name], DOB 11/12/1940, MRN [id number]  PCP:  Jonetta Osgood, NP  [facility] Cardiologist:  Nelva Bush, MD  [facility] Electrophysiologist:  Vickie Epley, MD   Referring MD: Jonetta Osgood, NP   Chief Complaint: 6 month follow-up  History of Present Illness:    [name] is a 81 y.o. female with a hx of valvular heart disease including mild to moderate MR, aortic insufficiency, tricuspid regurgitation, and mild AS, DM2, HTN, HLD, RA, anemia.   Prior echo, obtained through outside office, showed a hyperdynamic LVSF with an EF > 70%, moderate LVH, small left ventricle, normal RVSF and ventricular cavity size, mild AS, moderate AI, mild MR, moderate TR, mild pulmonary hypertension. She underwent echo in 02/2020 for follow-up of her valvular heart disease which demonstrated an EF of 60 to 65%, no regional wall motion abnormalities, grade 1 diastolic dysfunction, mild LVH, normal RV systolic function and ventricular cavity size, mildly elevated PASP at 42.2 mmHg, mild mitral regurgitation, mild aortic valve insufficiency, mild to moderate aortic valve sclerosis without evidence of stenosis.  When compared to prior echo, her valvular heart disease was stable and her right-sided pressure was improved.   Seen in clinic 07/15/20 for syncope. Heart monitor was placed  Seen in follow-up 09/09/20 for abnormal heart monitor. It showed frequent SVT lasting 30 seconds. She was referred to EP.   Saw EP 10/06/20 and it was felt SVT could not be medically treated due to bradycardia, suspected SVT was not related to syncopal episode.   Today,     Past Medical History:  Diagnosis Date   Anemia    Arthritis    Diabetes mellitus without complication (HCC)    GERD (gastroesophageal reflux disease)    Heart murmur    Hyperlipidemia    Hypertension    Leaky heart valve    Rheumatoid arthritis ([place])    Wears  dentures     Past Surgical History:  Procedure Laterality Date   ABDOMINAL HYSTERECTOMY     ANTERIOR VITRECTOMY Left 03/10/2020   Procedure: ANTERIOR VITRECTOMY;  Surgeon: Leandrew Koyanagi, MD;  Location: [place];  Service: Ophthalmology;  Laterality: Left;   CATARACT EXTRACTION     CATARACT EXTRACTION W/PHACO Left 03/10/2020   Procedure: CATARACT EXTRACTION PHACO  (IOC) LEFT  DIABETIC 16.27  02:09.7  12.5%;  Surgeon: Leandrew Koyanagi, MD;  Location: [place];  Service: Ophthalmology;  Laterality: Left;  Diabetic - oral meds   COLONOSCOPY WITH PROPOFOL N/A 10/25/2017   Procedure: COLONOSCOPY WITH PROPOFOL;  Surgeon: Lollie Sails, MD;  Location: [facility] ENDOSCOPY;  Service: Endoscopy;  Laterality: N/A;   ESOPHAGOGASTRODUODENOSCOPY (EGD) WITH PROPOFOL N/A 10/25/2017   Procedure: ESOPHAGOGASTRODUODENOSCOPY (EGD) WITH PROPOFOL;  Surgeon: Lollie Sails, MD;  Location: [facility] ENDOSCOPY;  Service: Endoscopy;  Laterality: N/A;   ESOPHAGOGASTRODUODENOSCOPY (EGD) WITH PROPOFOL N/A 03/04/2019   Procedure: ESOPHAGOGASTRODUODENOSCOPY (EGD) WITH PROPOFOL;  Surgeon: Lollie Sails, MD;  Location: [facility] ENDOSCOPY;  Service: Endoscopy;  Laterality: N/A;   EUS N/A 01/17/2018   Procedure: ESOPHAGEAL ENDOSCOPIC ULTRASOUND (EUS) RADIAL;  Surgeon: Reita Cliche, MD;  Location: [facility] ENDOSCOPY;  Service: Gastroenterology;  Laterality: N/A;   right side had fatty tissue taken off     TONSILLECTOMY      Current Medications: No outpatient medications have been marked as taking for the 06/06/21 encounter (Appointment) with Kathlen Mody, Manuelita Moxon H, PA-C.     Allergies:  Patient has no known allergies.   Social History   Socioeconomic History   Marital status: Widowed    Spouse name: Not on file   Number of children: Not on file   Years of education: Not on file   Highest education level: Not on file  Occupational History   Not on file  Tobacco Use   Smoking status: Never    Smokeless tobacco: Never  Vaping Use   Vaping Use: Never used  Substance and Sexual Activity   Alcohol use: No   Drug use: No   Sexual activity: Not on file  Other Topics Concern   Not on file  Social History Narrative   Not on file   Social Determinants of Health   Financial Resource Strain: Not on file  Food Insecurity: Not on file  Transportation Needs: Not on file  Physical Activity: Not on file  Stress: Not on file  Social Connections: Not on file     Family History: The patient's ***family history includes Diabetes in her father; Heart disease (age of onset: 23) in her brother; Hypertension in her mother. There is no history of Breast cancer.  ROS:   Please see the history of present illness.    *** All other systems reviewed and are negative.  EKGs/Labs/Other Studies Reviewed:    The following studies were reviewed today: ***  EKG:  EKG is *** ordered today.  The ekg ordered today demonstrates ***  Recent Labs: No results found for requested labs within last 8760 hours.  Recent Lipid Panel    Component Value Date/Time   CHOL 207 (H) 10/31/2019 0906   CHOL 228 (H) 07/25/2018 1624   TRIG 49 10/31/2019 0906   HDL 79 10/31/2019 0906   HDL 92 07/25/2018 1624   CHOLHDL 2.6 10/31/2019 0906   VLDL 10 10/31/2019 0906   LDLCALC 118 (H) 10/31/2019 0906   LDLCALC 120 (H) 07/25/2018 1624     Risk Assessment/Calculations:   {Does this patient have ATRIAL FIBRILLATION?[phone]}   Physical Exam:    VS:  There were no vitals taken for this visit.    Wt Readings from Last 3 Encounters:  01/21/21 135 lb 12.8 oz (61.6 kg)  10/21/20 137 lb 6.4 oz (62.3 kg)  10/06/20 137 lb (62.1 kg)     GEN: *** Well nourished, well developed in no acute distress HEENT: Normal NECK: No JVD; No carotid bruits LYMPHATICS: No lymphadenopathy CARDIAC: ***RRR, no murmurs, rubs, gallops RESPIRATORY:  Clear to auscultation without rales, wheezing or rhonchi  ABDOMEN: Soft,  non-tender, non-distended MUSCULOSKELETAL:  No edema; No deformity  SKIN: Warm and dry NEUROLOGIC:  Alert and oriented x 3 PSYCHIATRIC:  Normal affect   ASSESSMENT:    No diagnosis found. PLAN:    In order of problems listed above:  Syncope  HTN  SVT  Disposition: Follow up {follow up:15908} with ***   Shared Decision Making/Informed Consent   {Are you ordering a CV Procedure (e.g. stress test, cath, DCCV, TEE, etc)?   Press F2        :[id number]}    Signed, Yliana Gravois Ninfa Meeker, PA-C  06/05/2021 2:32 PM    [facility]

## 2021-06-06 ENCOUNTER — Ambulatory Visit: Payer: Medicare Other | Admitting: Medical

## 2021-06-06 DIAGNOSIS — R69 Illness, unspecified: Secondary | ICD-10-CM | POA: Diagnosis not present

## 2021-06-07 NOTE — Progress Notes (Signed)
+  Office Visit    Patient Name: [name] Date of Encounter: 06/08/2021  PCP:  Jonetta Osgood, NP   [place]  Cardiologist:  Nelva Bush, MD  Advanced Practice Provider:  No care team member to display Electrophysiologist:  Vickie Epley, MD    Chief Complaint    [name] is a 81 y.o. female with a hx of valvular heart disease (mild to moderate MR, AI and TR as well as mild AS), hypertension, hyperlipidemia, type 2 diabetes mellitus, rheumatoid arthritis, and anemia who presents for follow-up of syncope.   Past Medical History    Past Medical History:  Diagnosis Date   Anemia    Arthritis    Diabetes mellitus without complication (HCC)    GERD (gastroesophageal reflux disease)    Heart murmur    Hyperlipidemia    Hypertension    Leaky heart valve    Rheumatoid arthritis ([place])    Wears dentures    Past Surgical History:  Procedure Laterality Date   ABDOMINAL HYSTERECTOMY     ANTERIOR VITRECTOMY Left 03/10/2020   Procedure: ANTERIOR VITRECTOMY;  Surgeon: Leandrew Koyanagi, MD;  Location: [place];  Service: Ophthalmology;  Laterality: Left;   CATARACT EXTRACTION     CATARACT EXTRACTION W/PHACO Left 03/10/2020   Procedure: CATARACT EXTRACTION PHACO  (IOC) LEFT  DIABETIC 16.27  02:09.7  12.5%;  Surgeon: Leandrew Koyanagi, MD;  Location: [facility];  Service: Ophthalmology;  Laterality: Left;  Diabetic - oral meds   COLONOSCOPY WITH PROPOFOL N/A 10/25/2017   Procedure: COLONOSCOPY WITH PROPOFOL;  Surgeon: Lollie Sails, MD;  Location: [facility] ENDOSCOPY;  Service: Endoscopy;  Laterality: N/A;   ESOPHAGOGASTRODUODENOSCOPY (EGD) WITH PROPOFOL N/A 10/25/2017   Procedure: ESOPHAGOGASTRODUODENOSCOPY (EGD) WITH PROPOFOL;  Surgeon: Lollie Sails, MD;  Location: [facility] ENDOSCOPY;  Service: Endoscopy;  Laterality: N/A;   ESOPHAGOGASTRODUODENOSCOPY (EGD) WITH PROPOFOL N/A 03/04/2019   Procedure:  ESOPHAGOGASTRODUODENOSCOPY (EGD) WITH PROPOFOL;  Surgeon: Lollie Sails, MD;  Location: [facility] ENDOSCOPY;  Service: Endoscopy;  Laterality: N/A;   EUS N/A 01/17/2018   Procedure: ESOPHAGEAL ENDOSCOPIC ULTRASOUND (EUS) RADIAL;  Surgeon: Reita Cliche, MD;  Location: [facility] ENDOSCOPY;  Service: Gastroenterology;  Laterality: N/A;   right side had fatty tissue taken off     TONSILLECTOMY      Allergies  No Known Allergies  History of Present Illness    [name] is a 81 y.o. female with a  hx of valvular heart disease (mild to moderate MR, AI and TR as well as mild AS), hypertension, hyperlipidemia, type 2 diabetes mellitus, rheumatoid arthritis, and anemia who presents for follow-up of syncope last seen September 09, 2020 by Dr. Saunders Revel.  She was seen in February 2022 by [name], PA at which time she reported a syncopal episode in late December.  A 14-day event monitor was placed for further evaluation.  Office blood pressure was mildly elevated at that time.  Hydralazine was discontinued and losartan titrated to 75 mg daily.  When she was last seen in the office April 2022 she reported feeling well.  She not had any further syncopal episodes.  She notes 1 episode of palpitations while lying in bed while she was wearing the event monitor.  She otherwise has not had any significant palpitations.  She also denied chest pain at that time shortness of breath, lightheadedness, edema.  She was asked to see Dr. Quentin Ore with EP for further discussion.  May 2022,  she was seen by EP.  Her event monitor was reviewed which showed frequent episodes of SVT lasting 30 seconds.  She is unable to tolerate a beta-blocker due to resting bradycardia.  She endorsed a syncopal episode that happened while she was in her kitchen in December.  She had not had any recurrent episodes since that day.  During the time she was wearing her Holter monitor she did feel some palpitations but did not feel any lightheadedness  during the 14-day monitoring.  It seemed that her history of syncope was associated with a vasovagal episode.  Her blood pressure was not at goal and she increased her losartan to 100 mg once a day.  Today, she has been doing well.  She does not have any new cardiac symptoms.  She denies chest pain, shortness of breath, orthopnea, PND, lower extremity edema, and palpitations.  Her blood pressure is slightly elevated today in the clinic.  When I retook her blood pressure at the end of our appointment it was 130/60.  She is encouraged to take her blood pressure daily at home at the same time of day.  She has not had any further syncopal episodes since December 2021.  She did have an echocardiogram in October 2021 which is listed below with some mild MR and mild AR.  No aortic stenosis.  She also wore a monitor after the incident which showed 157 episodes of SVT.  These episodes were asymptomatic.  We are limited in our medical therapy due to her sinus bradycardia at baseline.  Heart rate was 56 today.  She has been trying to get some exercise in however her right knee arthritis limits her.  She does her best to maintain a heart healthy diet.  Overall she feels well today.  We have provided refills of all her cardiac medications.  We reviewed her last lipid panel which showed that her LDL was not at goal.  She would prefer to get her lipid panel added onto her labs that she will get in February with her PCP.  Reports no shortness of breath nor dyspnea on exertion. Reports no chest pain, pressure, or tightness. No edema, orthopnea, PND. Reports no palpitations.      EKGs/Labs/Other Studies Reviewed:   The following studies were reviewed today:  Zio 14-day monitor 08/04/2020  The patient was monitored for 14 days. The predominant rhythm was sinus with an average rate of 66 bpm (range 47 - 120 bpm in sinus). There were occasional PAC's (1.8% burden) and rare PVC's. A single 7-beat run of nonsustained  ventricular tachycardia occurred with a maximum rate of 152 bpm. There were 157 episodes of supraventricular tachycardia lasting up to 26.5 seconds with a maximum rate of 207 bpm. There were no patient triggered events.   Predominantly sinus rhythm with occasional PAC and rare PVC's.  157 episodes of PSVT occurred, as well as one short run of NSVT.  Echocardiogram 03/04/2020  IMPRESSIONS     1. Left ventricular ejection fraction, by estimation, is 60 to 65%. The  left ventricle has normal function. The left ventricle has no regional  wall motion abnormalities. There is mild left ventricular hypertrophy.  Left ventricular diastolic parameters  are consistent with Grade I diastolic dysfunction (impaired relaxation).   2. Right ventricular systolic function is normal. The right ventricular  size is normal. There is mildly elevated pulmonary artery systolic  pressure. The estimated right ventricular systolic pressure is 30.1 mmHg.   3. Mild mitral valve regurgitation.  4. The aortic valve is normal in structure. Aortic valve regurgitation is  mild. Mild to moderate aortic valve sclerosis/calcification is present,  without any evidence of aortic stenosis.   FINDINGS   Left Ventricle: Left ventricular ejection fraction, by estimation, is 60  to 65%. The left ventricle has normal function. The left ventricle has no  regional wall motion abnormalities. The left ventricular internal cavity  size was normal in size. There is   mild left ventricular hypertrophy. Left ventricular diastolic parameters  are consistent with Grade I diastolic dysfunction (impaired relaxation).   Right Ventricle: The right ventricular size is normal. No increase in  right ventricular wall thickness. Right ventricular systolic function is  normal. There is mildly elevated pulmonary artery systolic pressure. The  tricuspid regurgitant velocity is 3.05   m/s, and with an assumed right atrial pressure of 5 mmHg, the  estimated  right ventricular systolic pressure is 29.9 mmHg.   Left Atrium: Left atrial size was normal in size.   Right Atrium: Right atrial size was normal in size.   Pericardium: There is no evidence of pericardial effusion.   Mitral Valve: The mitral valve is normal in structure. There is mild  thickening of the mitral valve leaflet(s). Mild mitral valve  regurgitation. No evidence of mitral valve stenosis.   Tricuspid Valve: The tricuspid valve is normal in structure. Tricuspid  valve regurgitation is mild . No evidence of tricuspid stenosis.   Aortic Valve: The aortic valve is normal in structure. Aortic valve  regurgitation is mild. Aortic regurgitation PHT measures 693 msec. Mild to  moderate aortic valve sclerosis/calcification is present, without any  evidence of aortic stenosis. Aortic valve   mean gradient measures 9.0 mmHg. Aortic valve peak gradient measures 17.6  mmHg. Aortic valve area, by VTI measures 2.49 cm.   Pulmonic Valve: The pulmonic valve was normal in structure. Pulmonic valve  regurgitation is not visualized. No evidence of pulmonic stenosis.   Aorta: The aortic root is normal in size and structure.   Venous: The inferior vena cava is normal in size with greater than 50%  respiratory variability, suggesting right atrial pressure of 3 mmHg.   IAS/Shunts: No atrial level shunt detected by color flow Doppler.   EKG:  EKG is ordered today.  The ekg ordered today demonstrates sinus bradycardia  Recent Labs: No results found for requested labs within last 8760 hours.  Recent Lipid Panel    Component Value Date/Time   CHOL 207 (H) 10/31/2019 0906   CHOL 228 (H) 07/25/2018 1624   TRIG 49 10/31/2019 0906   HDL 79 10/31/2019 0906   HDL 92 07/25/2018 1624   CHOLHDL 2.6 10/31/2019 0906   VLDL 10 10/31/2019 0906   LDLCALC 118 (H) 10/31/2019 0906   LDLCALC 120 (H) 07/25/2018 1624    Home Medications   Current Meds  Medication Sig   acetaminophen  (TYLENOL) 500 MG tablet Take 500-1,000 mg by mouth every 6 (six) hours as needed.   Alcohol Swabs (ALCOHOL PREP) PADS alcohol prep pads   amLODipine (NORVASC) 5 MG tablet TAKE 2 TABLETS BY MOUTH IN  THE EVENING   aspirin EC 81 MG tablet Take 1 tablet (81 mg total) by mouth daily.   atorvastatin (LIPITOR) 10 MG tablet TAKE 1 TABLET BY MOUTH AT  BEDTIME FOR HIGH  CHOLESTEROL   Difluprednate 0.05 % EMUL Place 1 drop into the left eye 4 (four) times daily.   Ferrous Gluconate (IRON 27 PO) Take 27 mg by  mouth daily.    folic acid (FOLVITE) 1 MG tablet TAKE 2 TABLETS BY MOUTH  DAILY   glimepiride (AMARYL) 4 MG tablet Take 1/2 tablet with breakfast and 1/2 tablet with dinner   glucose blood (ONETOUCH VERIO) test strip Use as directed  For twice a day diag e11.65   hydrochlorothiazide (HYDRODIURIL) 25 MG tablet TAKE 1 TABLET BY MOUTH  DAILY   Lancets (ONETOUCH DELICA PLUS [id number]) MISC USE AS DIRECTED TWICE DAILY   losartan (COZAAR) 100 MG tablet Take 1 tablet (100 mg total) by mouth daily.   methotrexate 2.5 MG tablet Take 15 mg by mouth once a week.    neomycin-polymyxin b-dexamethasone (MAXITROL) 3.5-10000-0.1 OINT SMARTSIG:Application Left Eye 4 Times Daily PRN   pantoprazole (PROTONIX) 40 MG tablet TAKE 1 TABLET DAILY FOR    REFLUX   prednisoLONE acetate (PRED FORTE) 1 % ophthalmic suspension Administer 1 drop into the left eye four (4) times a day.   Vitamin D, Ergocalciferol, (DRISDOL) 1.25 MG (50000 UNIT) CAPS capsule Take 1 capsule (50,000 Units total) by mouth once a week.     Review of Systems      All other systems reviewed and are otherwise negative except as noted above.  Physical Exam    VS:  BP (!) 157/71    Pulse (!) 56    Ht 5\' 2"  (1.575 m)    Wt 136 lb (61.7 kg)    SpO2 99%    BMI 24.87 kg/m  , BMI Body mass index is 24.87 kg/m.  Wt Readings from Last 3 Encounters:  06/08/21 136 lb (61.7 kg)  01/21/21 135 lb 12.8 oz (61.6 kg)  10/21/20 137 lb 6.4 oz (62.3 kg)     GEN:  Well nourished, well developed, in no acute distress. HEENT: normal. Cardiac: RRR, no murmurs, rubs, or gallops. No clubbing, cyanosis, edema.  Radials/PT 2+ and equal bilaterally.  Respiratory:  Respirations regular and unlabored, clear to auscultation bilaterally. GI: Soft, nontender, nondistended. MS: No deformity or atrophy. Skin: Warm and dry, no rash. Neuro:  Strength and sensation are intact. Psych: Normal affect.  Assessment & Plan    Syncope and PSVT -Monitor results reviewed with the patient -No further syncope or palpitations -Avoiding nodal agents due to baseline sinus bradycardia -TSH was 1.856, 10/2019  Valvular heart disease -Euvolemic on exam -Echocardiogram 02/2020 showed stable aortic sclerosis, no stenosis and mild mitral regurgitation with mild pulmonary hypertension. -Asymptomatic. Would consider f/u echo at her next appointment   Hypertension -Blood pressure today is 157/71 but when I rechecked it, it was 130/60 -Continue to monitor your blood pressure at home daily and call us if >456 or <256 systolic  -Continue low-sodium, heart healthy diet  Hyperlipidemia -Last lipid panel showed total cholesterol 207, LDL 118, HDL 79, triglycerides 49 -LDL is above goal.  She is on Lipitor 10 mg daily which has room for titration however her last lipid panel was back in 2021. -I would favor redrawing a lipid panel and then titrating her Lipitor if appropriate. She gets labs next month with PCP so will add on lipid panel then.    Disposition: Follow up 1 year or sooner if needed with Nelva Bush, MD or APP.  Signed, Elgie Collard, PA-C 06/08/2021, 9:48 AM [place]

## 2021-06-08 ENCOUNTER — Other Ambulatory Visit: Payer: Self-pay

## 2021-06-08 ENCOUNTER — Ambulatory Visit (INDEPENDENT_AMBULATORY_CARE_PROVIDER_SITE_OTHER): Payer: Medicare Other | Admitting: Physician Assistant

## 2021-06-08 ENCOUNTER — Encounter: Payer: Self-pay | Admitting: Physician Assistant

## 2021-06-08 VITALS — BP 157/71 | HR 56 | Ht 62.0 in | Wt 136.0 lb

## 2021-06-08 DIAGNOSIS — I10 Essential (primary) hypertension: Secondary | ICD-10-CM

## 2021-06-08 DIAGNOSIS — E782 Mixed hyperlipidemia: Secondary | ICD-10-CM

## 2021-06-08 DIAGNOSIS — I38 Endocarditis, valve unspecified: Secondary | ICD-10-CM

## 2021-06-08 DIAGNOSIS — R55 Syncope and collapse: Secondary | ICD-10-CM

## 2021-06-08 DIAGNOSIS — B351 Tinea unguium: Secondary | ICD-10-CM | POA: Diagnosis not present

## 2021-06-08 DIAGNOSIS — I471 Supraventricular tachycardia: Secondary | ICD-10-CM

## 2021-06-08 DIAGNOSIS — M79674 Pain in right toe(s): Secondary | ICD-10-CM | POA: Diagnosis not present

## 2021-06-08 DIAGNOSIS — M79675 Pain in left toe(s): Secondary | ICD-10-CM | POA: Diagnosis not present

## 2021-06-08 MED ORDER — AMLODIPINE BESYLATE 5 MG PO TABS: ORAL_TABLET | ORAL | 3 refills | Status: DC

## 2021-06-08 MED ORDER — ATORVASTATIN CALCIUM 10 MG PO TABS: ORAL_TABLET | ORAL | 3 refills | Status: DC

## 2021-06-08 MED ORDER — HYDROCHLOROTHIAZIDE 25 MG PO TABS: 25.0000 mg | ORAL_TABLET | Freq: Every day | ORAL | 3 refills | Status: DC

## 2021-06-08 MED ORDER — LOSARTAN POTASSIUM 100 MG PO TABS: 100.0000 mg | ORAL_TABLET | Freq: Every day | ORAL | 3 refills | Status: DC

## 2021-06-08 NOTE — Patient Instructions (Addendum)
Medication Instructions:  1) Your physician recommends that you continue on your current medications as directed. Please refer to the Current Medication list given to you today.  2) Refills have been sent to your pharmacy for: - Amlodipine - Atorvastatin - HCTZ - Losartan  *If you need a refill on your cardiac medications before your next appointment, please call your pharmacy*   Lab Work: - None ordered today  - Please make sure you have a LIPID PANEL drawn when you see your Primary Care in February   If you have labs (blood work) drawn today and your tests are completely normal, you will receive your results only by: [place] (if you have MyChart) OR A paper copy in the mail If you have any lab test that is abnormal or we need to change your treatment, we will call you to review the results.   Testing/Procedures: - none ordered   Follow-Up: At [facility], you and your health needs are our priority.  As part of our continuing mission to provide you with exceptional heart care, we have created designated Provider Care Teams.  These Care Teams include your primary Cardiologist (physician) and Advanced Practice Providers (APPs -  Physician Assistants and Nurse Practitioners) who all work together to provide you with the care you need, when you need it.  We recommend signing up for the patient portal called "MyChart".  Sign up information is provided on this After Visit Summary.  MyChart is used to connect with patients for Virtual Visits (Telemedicine).  Patients are able to view lab/test results, encounter notes, upcoming appointments, etc.  Non-urgent messages can be sent to your provider as well.   To learn more about what you can do with MyChart, go to [email or web address].    Your next appointment:   1 year(s)  The format for your next appointment:   In Person  Provider:   You may see Nelva Bush, MD or one of the following Advanced Practice Providers on  your designated Care Team:   Murray Hodgkins, NP [name], PA-C Cadence Kathlen Mody, [place]    Other Instructions  1) Call if your the top number for your blood pressure is consistently > 160 or < 100, or if you are having symptoms  How to Take Your Blood Pressure Blood pressure is a measurement of how strongly your blood is pressing against the walls of your arteries. Arteries are blood vessels that carry blood from your heart throughout your body. Your health care provider takes your blood pressure at each office visit. You can also take your own blood pressure at home with a blood pressure monitor. You may need to take your own blood pressure to: Confirm a diagnosis of high blood pressure (hypertension). Monitor your blood pressure over time. Make sure your blood pressure medicine is working. Supplies needed: Blood pressure monitor. Dining room chair to sit in. Table or desk. Small notebook and pencil or pen. How to prepare To get the most accurate reading, avoid the following for 30 minutes before you check your blood pressure: Drinking caffeine. Drinking alcohol. Eating. Smoking. Exercising. Five minutes before you check your blood pressure: Use the bathroom and urinate so that you have an empty bladder. Sit quietly in a dining room chair. Do not sit in a soft couch or an armchair. Do not talk. How to take your blood pressure To check your blood pressure, follow the instructions in the manual that came with your blood pressure monitor. If you have a  digital blood pressure monitor, the instructions may be as follows: Sit up straight in a chair. Place your feet on the floor. Do not cross your ankles or legs. Rest your left arm at the level of your heart on a table or desk or on the arm of a chair. Pull up your shirt sleeve. Wrap the blood pressure cuff around the upper part of your left arm, 1 inch (2.5 cm) above your elbow. It is best to wrap the cuff around bare skin. Fit the  cuff snugly around your arm. You should be able to place only one finger between the cuff and your arm. Position the cord so that it rests in the bend of your elbow. Press the power button. Sit quietly while the cuff inflates and deflates. Read the digital reading on the monitor screen and write the numbers down (record them) in a notebook. Wait 2-3 minutes, then repeat the steps, starting at step 1. What does my blood pressure reading mean? A blood pressure reading consists of a higher number over a lower number. Ideally, your blood pressure should be below 120/80. The first ("top") number is called the systolic pressure. It is a measure of the pressure in your arteries as your heart beats. The second ("bottom") number is called the diastolic pressure. It is a measure of the pressure in your arteries as the heart relaxes. Blood pressure is classified into five stages. The following are the stages for adults who do not have a short-term serious illness or a chronic condition. Systolic pressure and diastolic pressure are measured in a unit called mm Hg (millimeters of mercury).  Normal Systolic pressure: below 696. Diastolic pressure: below 80. Elevated Systolic pressure: 295-284. Diastolic pressure: below 80. Hypertension stage 1 Systolic pressure: 132-440. Diastolic pressure: 10-27. Hypertension stage 2 Systolic pressure: 253 or above. Diastolic pressure: 90 or above. You can have elevated blood pressure or hypertension even if only the systolic or only the diastolic number in your reading is higher than normal. Follow these instructions at home: Medicines Take over-the-counter and prescription medicines only as told by your health care provider. Tell your health care provider if you are having any side effects from blood pressure medicine. General instructions Check your blood pressure as often as recommended by your health care provider. Check your blood pressure at the same time every  day. Take your monitor to the next appointment with your health care provider to make sure that: You are using it correctly. It provides accurate readings. Understand what your goal blood pressure numbers are. Keep all follow-up visits as told by your health care provider. This is important. General tips Your health care provider can suggest a reliable monitor that will meet your needs. There are several types of home blood pressure monitors. Choose a monitor that has an arm cuff. Do not choose a monitor that measures your blood pressure from your wrist or finger. Choose a cuff that wraps snugly around your upper arm. You should be able to fit only one finger between your arm and the cuff. You can buy a blood pressure monitor at most drugstores or online. Where to find more information American Heart Association: www.heart.org Contact a health care provider if: Your blood pressure is consistently high. Your blood pressure is suddenly low. Get help right away if: Your systolic blood pressure is higher than 180. Your diastolic blood pressure is higher than 120. Summary Blood pressure is a measurement of how strongly your blood is pressing against the  walls of your arteries. A blood pressure reading consists of a higher number over a lower number. Ideally, your blood pressure should be below 120/80. Check your blood pressure at the same time every day. Avoid caffeine, alcohol, smoking, and exercise for 30 minutes prior to checking your blood pressure. These agents can affect the accuracy of the blood pressure reading. This information is not intended to replace advice given to you by your health care provider. Make sure you discuss any questions you have with your health care provider. Document Revised: 03/24/2020 Document Reviewed: 05/09/2019 Elsevier Patient Education  2022 [name].

## 2021-06-14 DIAGNOSIS — H35352 Cystoid macular degeneration, left eye: Secondary | ICD-10-CM | POA: Diagnosis not present

## 2021-07-19 DIAGNOSIS — H35352 Cystoid macular degeneration, left eye: Secondary | ICD-10-CM | POA: Diagnosis not present

## 2021-07-22 ENCOUNTER — Ambulatory Visit (INDEPENDENT_AMBULATORY_CARE_PROVIDER_SITE_OTHER): Payer: Medicare Other | Admitting: Nurse Practitioner

## 2021-07-22 ENCOUNTER — Other Ambulatory Visit: Payer: Self-pay

## 2021-07-22 ENCOUNTER — Encounter: Payer: Self-pay | Admitting: Nurse Practitioner

## 2021-07-22 VITALS — BP 140/70 | HR 60 | Temp 98.2°F | Resp 16 | Ht 62.0 in | Wt 134.0 lb

## 2021-07-22 DIAGNOSIS — K219 Gastro-esophageal reflux disease without esophagitis: Secondary | ICD-10-CM

## 2021-07-22 DIAGNOSIS — I10 Essential (primary) hypertension: Secondary | ICD-10-CM | POA: Diagnosis not present

## 2021-07-22 DIAGNOSIS — E1165 Type 2 diabetes mellitus with hyperglycemia: Secondary | ICD-10-CM

## 2021-07-22 LAB — POCT GLYCOSYLATED HEMOGLOBIN (HGB A1C): Hemoglobin A1C: 6.8 % — AB (ref 4.0–5.6)

## 2021-07-22 MED ORDER — PANTOPRAZOLE SODIUM 40 MG PO TBEC: 40.0000 mg | DELAYED_RELEASE_TABLET | Freq: Every day | ORAL | 3 refills | Status: DC

## 2021-07-22 MED ORDER — ONETOUCH VERIO VI STRP: ORAL_STRIP | 3 refills | Status: DC

## 2021-07-22 NOTE — Progress Notes (Signed)
[facility] [place], [place] [address]  Internal MEDICINE  Office Visit Note  Patient Name: [name]  [id number]  [id number]  Date of Service: 07/22/2021  Chief Complaint  Patient presents with   Follow-up   Diabetes   Gastroesophageal Reflux   Hypertension   Hyperlipidemia   Anemia    HPI Caprice presents for a follow-up visit for diabetes, hypertension and GERD.  Her last office visit was in August and a 48-month follow-up was recommended but she has not been seen until today.  Her A1c today is 6.8 which is stable and still at goal which is under 7.0.  Her previous A1c was 6.5 in August 2022.  She is scheduled for her annual wellness visit in early June this year. Her blood pressure is well controlled on current medication Her acid reflux symptoms are well controlled with omeprazole and she is in need of refills.    Current Medication: Outpatient Encounter Medications as of 07/22/2021  Medication Sig   acetaminophen (TYLENOL) 500 MG tablet Take 500-1,000 mg by mouth every 6 (six) hours as needed.   Alcohol Swabs (ALCOHOL PREP) PADS alcohol prep pads   amLODipine (NORVASC) 5 MG tablet TAKE 2 TABLETS (10 mg)  BY MOUTH IN  THE EVENING   aspirin EC 81 MG tablet Take 1 tablet (81 mg total) by mouth daily.   atorvastatin (LIPITOR) 10 MG tablet TAKE 1 TABLET BY MOUTH AT  BEDTIME FOR HIGH  CHOLESTEROL   Difluprednate 0.05 % EMUL Place 1 drop into the left eye 4 (four) times daily.   Ferrous Gluconate (IRON 27 PO) Take 27 mg by mouth daily.    folic acid (FOLVITE) 1 MG tablet TAKE 2 TABLETS BY MOUTH  DAILY   glimepiride (AMARYL) 4 MG tablet Take 1/2 tablet with breakfast and 1/2 tablet with dinner   hydrochlorothiazide (HYDRODIURIL) 25 MG tablet Take 1 tablet (25 mg total) by mouth daily.   Lancets (ONETOUCH DELICA PLUS [id number]) MISC USE AS DIRECTED TWICE DAILY   losartan (COZAAR) 100 MG tablet Take 1 tablet (100 mg total) by mouth daily.    methotrexate 2.5 MG tablet Take 15 mg by mouth once a week.    neomycin-polymyxin b-dexamethasone (MAXITROL) 3.5-10000-0.1 OINT SMARTSIG:Application Left Eye 4 Times Daily PRN   prednisoLONE acetate (PRED FORTE) 1 % ophthalmic suspension Administer 1 drop into the left eye four (4) times a day.   Vitamin D, Ergocalciferol, (DRISDOL) 1.25 MG (50000 UNIT) CAPS capsule Take 1 capsule (50,000 Units total) by mouth once a week.   [DISCONTINUED] glucose blood (ONETOUCH VERIO) test strip Use as directed  For twice a day diag e11.65   [DISCONTINUED] pantoprazole (PROTONIX) 40 MG tablet TAKE 1 TABLET DAILY FOR    REFLUX   glucose blood (ONETOUCH VERIO) test strip Use as directed  For twice a day diag e11.65   pantoprazole (PROTONIX) 40 MG tablet Take 1 tablet (40 mg total) by mouth daily. TAKE 1 TABLET DAILY FOR    REFLUX   No facility-administered encounter medications on file as of 07/22/2021.    Surgical History: Past Surgical History:  Procedure Laterality Date   ABDOMINAL HYSTERECTOMY     ANTERIOR VITRECTOMY Left 03/10/2020   Procedure: ANTERIOR VITRECTOMY;  Surgeon: Leandrew Koyanagi, MD;  Location: [place];  Service: Ophthalmology;  Laterality: Left;   CATARACT EXTRACTION     CATARACT EXTRACTION W/PHACO Left 03/10/2020   Procedure: CATARACT EXTRACTION PHACO  (IOC) LEFT  DIABETIC 16.27  02:09.7  12.5%;  Surgeon: Leandrew Koyanagi, MD;  Location: [place];  Service: Ophthalmology;  Laterality: Left;  Diabetic - oral meds   COLONOSCOPY WITH PROPOFOL N/A 10/25/2017   Procedure: COLONOSCOPY WITH PROPOFOL;  Surgeon: Lollie Sails, MD;  Location: [facility] ENDOSCOPY;  Service: Endoscopy;  Laterality: N/A;   ESOPHAGOGASTRODUODENOSCOPY (EGD) WITH PROPOFOL N/A 10/25/2017   Procedure: ESOPHAGOGASTRODUODENOSCOPY (EGD) WITH PROPOFOL;  Surgeon: Lollie Sails, MD;  Location: [facility] ENDOSCOPY;  Service: Endoscopy;  Laterality: N/A;   ESOPHAGOGASTRODUODENOSCOPY (EGD) WITH PROPOFOL  N/A 03/04/2019   Procedure: ESOPHAGOGASTRODUODENOSCOPY (EGD) WITH PROPOFOL;  Surgeon: Lollie Sails, MD;  Location: [facility] ENDOSCOPY;  Service: Endoscopy;  Laterality: N/A;   EUS N/A 01/17/2018   Procedure: ESOPHAGEAL ENDOSCOPIC ULTRASOUND (EUS) RADIAL;  Surgeon: Reita Cliche, MD;  Location: [facility] ENDOSCOPY;  Service: Gastroenterology;  Laterality: N/A;   right side had fatty tissue taken off     TONSILLECTOMY      Medical History: Past Medical History:  Diagnosis Date   Anemia    Arthritis    Diabetes mellitus without complication (HCC)    GERD (gastroesophageal reflux disease)    Heart murmur    Hyperlipidemia    Hypertension    Leaky heart valve    Rheumatoid arthritis (HCC)    Wears dentures     Family History: Family History  Problem Relation Age of Onset   Hypertension Mother    Diabetes Father    Heart disease Brother 44       Open heart surgery (details unknown)   Breast cancer Neg Hx     Social History   Socioeconomic History   Marital status: Widowed    Spouse name: Not on file   Number of children: Not on file   Years of education: Not on file   Highest education level: Not on file  Occupational History   Not on file  Tobacco Use   Smoking status: Never   Smokeless tobacco: Never  Vaping Use   Vaping Use: Never used  Substance and Sexual Activity   Alcohol use: No   Drug use: No   Sexual activity: Not on file  Other Topics Concern   Not on file  Social History Narrative   Not on file   Social Determinants of Health   Financial Resource Strain: Not on file  Food Insecurity: Not on file  Transportation Needs: Not on file  Physical Activity: Not on file  Stress: Not on file  Social Connections: Not on file  Intimate Partner Violence: Not on file      Review of Systems  Constitutional:  Negative for chills, fatigue and unexpected weight change.  HENT:  Positive for postnasal drip. Negative for congestion, rhinorrhea, sneezing and sore  throat.   Eyes:  Negative for redness.  Respiratory:  Negative for cough, chest tightness and shortness of breath.   Cardiovascular:  Negative for chest pain and palpitations.  Gastrointestinal:  Negative for abdominal pain, constipation, diarrhea, nausea and vomiting.  Genitourinary:  Negative for dysuria and frequency.  Musculoskeletal:  Negative for arthralgias, back pain, joint swelling and neck pain.  Skin:  Negative for rash.  Neurological: Negative.  Negative for tremors and numbness.  Hematological:  Negative for adenopathy. Does not bruise/bleed easily.  Psychiatric/Behavioral:  Negative for behavioral problems (Depression), sleep disturbance and suicidal ideas. The patient is not nervous/anxious.    Vital Signs: BP 140/70    Pulse 60    Temp 98.2 F (36.8 C)  Resp 16    Ht 5\' 2"  (1.575 m)    Wt 134 lb (60.8 kg)    SpO2 99%    BMI 24.51 kg/m    Physical Exam Vitals reviewed.  Constitutional:      General: She is not in acute distress.    Appearance: Normal appearance. She is obese. She is not ill-appearing.  HENT:     Head: Normocephalic and atraumatic.  Eyes:     Pupils: Pupils are equal, round, and reactive to light.  Cardiovascular:     Rate and Rhythm: Normal rate and regular rhythm.  Pulmonary:     Effort: Pulmonary effort is normal. No respiratory distress.  Neurological:     Mental Status: She is alert and oriented to person, place, and time.  Psychiatric:        Mood and Affect: Mood normal.        Behavior: Behavior normal.       Assessment/Plan: 1. Type 2 diabetes mellitus with hyperglycemia, without long-term current use of insulin (HCC) Her A1c remains under 7 which is at goal.  Today's A1c was 6.8 which is slightly elevated from her previous A1c of 6.5 in August 2022.  She is stable at this time.  Refills of test strips for her glucose meter were sent to pharmacy.  We will follow-up in 3 months to repeat A1c. - POCT HgB A1C - glucose blood  (ONETOUCH VERIO) test strip; Use as directed  For twice a day diag e11.65  Dispense: 100 each; Refill: 3  2. Essential hypertension Blood pressure is stable with current medications, no interventions necessary at this time.  3. Gastroesophageal reflux disease without esophagitis Stable, continue pantoprazole as prescribed, refill sent to pharmacy. - pantoprazole (PROTONIX) 40 MG tablet; Take 1 tablet (40 mg total) by mouth daily. TAKE 1 TABLET DAILY FOR    REFLUX  Dispense: 90 tablet; Refill: 3   General Counseling: Torrance verbalizes understanding of the findings of todays visit and agrees with plan of treatment. I have discussed any further diagnostic evaluation that may be needed or ordered today. We also reviewed her medications today. she has been encouraged to call the office with any questions or concerns that should arise related to todays visit.    Orders Placed This Encounter  Procedures   POCT HgB A1C    Meds ordered this encounter  Medications   pantoprazole (PROTONIX) 40 MG tablet    Sig: Take 1 tablet (40 mg total) by mouth daily. TAKE 1 TABLET DAILY FOR    REFLUX    Dispense:  90 tablet    Refill:  3   glucose blood (ONETOUCH VERIO) test strip    Sig: Use as directed  For twice a day diag e11.65    Dispense:  100 each    Refill:  3    Return for Vivien Rota- reschedule med well visit for 6/2 at 820 am, ok'd by Haward Pope d/t transportation issues.   Total time spent:30 Minutes Time spent includes review of chart, medications, test results, and follow up plan with the patient.   [place] Controlled Substance Database was reviewed by me.  This patient was seen by Jonetta Osgood, FNP-C in collaboration with Dr. Clayborn Bigness as a part of collaborative care agreement.   Jordani Nunn R. Valetta Fuller, MSN, FNP-C Internal medicine

## 2021-08-05 ENCOUNTER — Ambulatory Visit: Payer: Medicare Other | Admitting: Medical

## 2021-08-15 NOTE — Progress Notes (Signed)
? ?Cardiology Office Note   ? ?Date:  08/17/2021  ? ?ID:  [name], DOB May 04, 1941, MRN [id number] ? ?PCP:  Jonetta Osgood, NP  ?Cardiologist:  Nelva Bush, MD  ?Electrophysiologist:  Vickie Epley, MD  ? ?Chief Complaint: Follow-up ? ?History of Present Illness:  ? ?[name] is a 81 y.o. female with history of pSVT, valvular heart disease including mild to moderate mitral regurgitation, aortic insufficiency, tricuspid regurgitation, and mild aortic stenosis, vasovagal syncope, DM2, HTN, HLD, RA, and anemia who presents for follow-up of PSVT.  ?  ?Prior echo, obtained through outside office, showed a hyperdynamic LVSF with an EF > 70%, moderate LVH, small left ventricle, normal RVSF and ventricular cavity size, mild AS, moderate AI, mild MR, moderate TR, mild pulmonary hypertension. She has since been followed at our [facility] office, noting stable exertional dyspnea and sporadic "twitches" of randomly occurring chest pain. ?  ?She underwent echo in 02/2020 for follow-up of her valvular heart disease which demonstrated an EF of 60 to 65%, no regional wall motion abnormalities, grade 1 diastolic dysfunction, mild LVH, normal RV systolic function and ventricular cavity size, mildly elevated PASP at 42.2 mmHg, mild mitral regurgitation, mild aortic valve insufficiency, mild to moderate aortic valve sclerosis without evidence of stenosis.  When compared to prior echo, her valvular heart disease was stable and her right-sided pressure was improved.  She was seen in the office in 04/2020 for follow-up of hypertension at which time she was doing relatively well.  She was tolerating the increased dose of losartan.  She did continue to note intermittent palpitations that would last a second or 2 without associated symptoms.  It was recommended to possibly discontinue hydralazine in follow-up for ease of dosing her medications if her blood pressure remained well controlled.  Following this visit,  there was some confusion regarding her HCTZ as it appeared this was discontinued in late 04/2020 though she continued to take this medication.  She was seen in 06/2020 and reported a syncopal episode in late 04/2020.  Outpatient cardiac monitoring demonstrated a predominant rhythm of sinus with an average rate of 66 bpm (range 47 to 120 bpm in sinus), a single 7 beat run of NSVT occurred with a maximum rate of 152 bpm, 157 episodes of SVT lasting up to 26.5 seconds with a maximum rate of 207 bpm, occasional PACs with a 1.8% burden, rare PVCs, and no patient triggered events.  She was subsequently evaluated by EP for her episode of syncope and frequent episodes of SVT noted on outpatient cardiac monitoring with noted inability to take a beta-blocker due to resting bradycardia.  EP felt her syncopal episode was consistent with vasovagal etiology.  With regards to her SVT, pharmacologic therapy was deferred given that she was asymptomatic with these runs, and in the context of resting bradycardia.  SVT was not felt to have contributed to her syncopal episode.  She was last seen in the office in 05/2021 and was doing well, without symptoms of angina or decompensation.  She had not had any further syncopal episodes since her initial episode in 04/2020. ?  ?She comes in doing well from a cardiac perspective.  She is without symptoms of angina, dyspnea, dizziness, presyncope, or syncope.  She does note occasional palpitations, mostly when she is stressed.  She has not had any further syncopal episodes.  She reports adherence to her medications.  She takes 10 mg of amlodipine at night along with 100 mg of losartan  and 25 mg of HCTZ each morning.  She has taken her morning medications already today.  Outside of brief episodes of palpitations, occurring when stressed, she does not have any issues or concerns at this time. ? ? ?Labs independently reviewed: ?06/2021 - A1c 6.8 ?03/2021 - Hgb 10.6, PLT 189, AST/ALT normal, albumin  3.9 ?04/2020 - potassium 3.7, BUN 28, serum creatinine 1.16 ?10/2019 - TSH normal, TC 207, TG 49, HDL 79, LDL 118 ? ?Past Medical History:  ?Diagnosis Date  ? Anemia   ? Arthritis   ? Diabetes mellitus without complication ([place])   ? GERD (gastroesophageal reflux disease)   ? Heart murmur   ? Hyperlipidemia   ? Hypertension   ? Leaky heart valve   ? Rheumatoid arthritis ([place])   ? Wears dentures   ? ? ?Past Surgical History:  ?Procedure Laterality Date  ? ABDOMINAL HYSTERECTOMY    ? ANTERIOR VITRECTOMY Left 03/10/2020  ? Procedure: ANTERIOR VITRECTOMY;  Surgeon: Leandrew Koyanagi, MD;  Location: [place];  Service: Ophthalmology;  Laterality: Left;  ? CATARACT EXTRACTION    ? CATARACT EXTRACTION W/PHACO Left 03/10/2020  ? Procedure: CATARACT EXTRACTION PHACO  (IOC) LEFT  DIABETIC 16.27  02:09.7  12.5%;  Surgeon: Leandrew Koyanagi, MD;  Location: [place];  Service: Ophthalmology;  Laterality: Left;  Diabetic - oral meds  ? COLONOSCOPY WITH PROPOFOL N/A 10/25/2017  ? Procedure: COLONOSCOPY WITH PROPOFOL;  Surgeon: Lollie Sails, MD;  Location: [facility] ENDOSCOPY;  Service: Endoscopy;  Laterality: N/A;  ? ESOPHAGOGASTRODUODENOSCOPY (EGD) WITH PROPOFOL N/A 10/25/2017  ? Procedure: ESOPHAGOGASTRODUODENOSCOPY (EGD) WITH PROPOFOL;  Surgeon: Lollie Sails, MD;  Location: [facility] ENDOSCOPY;  Service: Endoscopy;  Laterality: N/A;  ? ESOPHAGOGASTRODUODENOSCOPY (EGD) WITH PROPOFOL N/A 03/04/2019  ? Procedure: ESOPHAGOGASTRODUODENOSCOPY (EGD) WITH PROPOFOL;  Surgeon: Lollie Sails, MD;  Location: [facility] ENDOSCOPY;  Service: Endoscopy;  Laterality: N/A;  ? EUS N/A 01/17/2018  ? Procedure: ESOPHAGEAL ENDOSCOPIC ULTRASOUND (EUS) RADIAL;  Surgeon: Reita Cliche, MD;  Location: [facility] ENDOSCOPY;  Service: Gastroenterology;  Laterality: N/A;  ? right side had fatty tissue taken off    ? TONSILLECTOMY    ? ? ?Current Medications: ?Current Meds  ?Medication Sig  ? acetaminophen (TYLENOL) 500 MG tablet Take  500-1,000 mg by mouth every 6 (six) hours as needed.  ? aspirin EC 81 MG tablet Take 1 tablet (81 mg total) by mouth daily.  ? atorvastatin (LIPITOR) 10 MG tablet TAKE 1 TABLET BY MOUTH AT  BEDTIME FOR HIGH  CHOLESTEROL  ? Difluprednate 0.05 % EMUL Place 1 drop into the left eye 4 (four) times daily.  ? Ferrous Gluconate (IRON 27 PO) Take 27 mg by mouth daily.   ? folic acid (FOLVITE) 1 MG tablet TAKE 2 TABLETS BY MOUTH  DAILY  ? glimepiride (AMARYL) 4 MG tablet Take 1/2 tablet with breakfast and 1/2 tablet with dinner  ? glucose blood (ONETOUCH VERIO) test strip Use as directed  For twice a day diag e11.65  ? Lancets (ONETOUCH DELICA PLUS [id number]) MISC USE AS DIRECTED TWICE DAILY  ? losartan (COZAAR) 100 MG tablet Take 1 tablet (100 mg total) by mouth daily.  ? methotrexate 2.5 MG tablet Take 15 mg by mouth once a week.   ? neomycin-polymyxin b-dexamethasone (MAXITROL) 3.5-10000-0.1 OINT SMARTSIG:Application Left Eye 4 Times Daily PRN  ? pantoprazole (PROTONIX) 40 MG tablet Take 1 tablet (40 mg total) by mouth daily. TAKE 1 TABLET DAILY FOR    REFLUX  ? prednisoLONE acetate (PRED  FORTE) 1 % ophthalmic suspension Administer 1 drop into the left eye four (4) times a day.  ? Vitamin D, Ergocalciferol, (DRISDOL) 1.25 MG (50000 UNIT) CAPS capsule Take 1 capsule (50,000 Units total) by mouth once a week.  ? [DISCONTINUED] amLODipine (NORVASC) 5 MG tablet TAKE 2 TABLETS (10 mg)  BY MOUTH IN  THE EVENING  ? [DISCONTINUED] hydrochlorothiazide (HYDRODIURIL) 25 MG tablet Take 1 tablet (25 mg total) by mouth daily.  ? ? ?Allergies:   Patient has no known allergies.  ? ?Social History  ? ?Socioeconomic History  ? Marital status: Widowed  ?  Spouse name: Not on file  ? Number of children: Not on file  ? Years of education: Not on file  ? Highest education level: Not on file  ?Occupational History  ? Not on file  ?Tobacco Use  ? Smoking status: Never  ? Smokeless tobacco: Never  ?Vaping Use  ? Vaping Use: Never used  ?Substance  and Sexual Activity  ? Alcohol use: No  ? Drug use: No  ? Sexual activity: Not on file  ?Other Topics Concern  ? Not on file  ?Social History Narrative  ? Not on file  ? ?Social Determinants of Health  ? ?

## 2021-08-17 ENCOUNTER — Ambulatory Visit (INDEPENDENT_AMBULATORY_CARE_PROVIDER_SITE_OTHER): Payer: Medicare Other

## 2021-08-17 ENCOUNTER — Encounter: Payer: Self-pay | Admitting: Physician Assistant

## 2021-08-17 ENCOUNTER — Other Ambulatory Visit
Admission: RE | Admit: 2021-08-17 | Discharge: 2021-08-17 | Disposition: A | Discharge status: Home / Self Care | Payer: Medicare Other | Attending: Physician Assistant | Admitting: Physician Assistant

## 2021-08-17 ENCOUNTER — Ambulatory Visit (INDEPENDENT_AMBULATORY_CARE_PROVIDER_SITE_OTHER): Payer: Medicare Other | Admitting: Physician Assistant

## 2021-08-17 ENCOUNTER — Other Ambulatory Visit: Payer: Self-pay

## 2021-08-17 VITALS — BP 90/50 | HR 51 | Ht 62.0 in | Wt 133.1 lb

## 2021-08-17 DIAGNOSIS — I471 Supraventricular tachycardia: Secondary | ICD-10-CM | POA: Diagnosis not present

## 2021-08-17 DIAGNOSIS — R001 Bradycardia, unspecified: Secondary | ICD-10-CM | POA: Diagnosis not present

## 2021-08-17 DIAGNOSIS — E785 Hyperlipidemia, unspecified: Secondary | ICD-10-CM | POA: Diagnosis not present

## 2021-08-17 DIAGNOSIS — I38 Endocarditis, valve unspecified: Secondary | ICD-10-CM

## 2021-08-17 DIAGNOSIS — R55 Syncope and collapse: Secondary | ICD-10-CM | POA: Diagnosis not present

## 2021-08-17 DIAGNOSIS — I10 Essential (primary) hypertension: Secondary | ICD-10-CM | POA: Diagnosis not present

## 2021-08-17 LAB — BASIC METABOLIC PANEL
Anion gap: 6 (ref 5–15)
BUN: 31 mg/dL — ABNORMAL HIGH (ref 8–23)
CO2: 30 mmol/L (ref 22–32)
Calcium: 9.5 mg/dL (ref 8.9–10.3)
Chloride: 106 mmol/L (ref 98–111)
Creatinine, Ser: 1.19 mg/dL — ABNORMAL HIGH (ref 0.44–1.00)
GFR, Estimated: 46 mL/min — ABNORMAL LOW (ref >60)
Glucose, Bld: 133 mg/dL — ABNORMAL HIGH (ref 70–99)
Potassium: 3.6 mmol/L (ref 3.5–5.1)
Sodium: 142 mmol/L (ref 135–145)

## 2021-08-17 LAB — TSH: TSH: 1.672 u[IU]/mL (ref 0.350–4.500)

## 2021-08-17 LAB — MAGNESIUM: Magnesium: 1.8 mg/dL (ref 1.7–2.4)

## 2021-08-17 MED ORDER — AMLODIPINE BESYLATE 5 MG PO TABS: ORAL_TABLET | ORAL | Status: DC

## 2021-08-17 NOTE — Patient Instructions (Addendum)
Medication Instructions:  ?- Your physician has recommended you make the following change in your medication:  ? ?1) STOP HCTZ (hydrochlorothiazide) ? ?2) DECREASE Amlodipine to 5 mg: ?- take 1 tablet by mouth once daily ? ?*If you need a refill on your cardiac medications before your next appointment, please call your pharmacy* ? ? ?Lab Work: ?- Your physician recommends that you have lab work today: BMP/ Magnesium/ TSH ? ? ?[facility] [place] ?1st desk on the right- REGISTRATION- to check in ? ?If you have labs (blood work) drawn today and your tests are completely normal, you will receive your results only by: ?MyChart Message (if you have MyChart) OR ?A paper copy in the mail ?If you have any lab test that is abnormal or we need to change your treatment, we will call you to review the results. ? ? ?Testing/Procedures: ? ?1) Heart Monitor: ? ?- Your physician has recommended that you wear a Zio XT (heart) monitor.  ? ?Length of Wear: 14 days ?Placement: This will be mailed to your home address within 3-4 business days ? ?This monitor is a medical device that records the heart?s electrical activity. Doctors most often use these monitors to diagnose arrhythmias. Arrhythmias are problems with the speed or rhythm of the heartbeat. The monitor is a small device applied to your chest. You can wear one while you do your normal daily activities. While wearing this monitor if you have any symptoms to push the button and record what you felt. Once you have worn this monitor for the period of time provider prescribed (Usually 14 days), you will return the monitor device in the postage paid box. Once it is returned they will download the data collected and provide [place] with a report which the provider will then review and we will call you with those results. Important tips: ? ?Avoid showering during the first 24 hours of wearing the monitor. ?Avoid excessive sweating to help maximize wear time. ?Do not submerge the  device, no hot tubs, and no swimming pools. ?Keep any lotions or oils away from the patch. ?After 24 hours you may shower with the patch on. Take brief showers with your back facing the shower head.  ?Do not remove patch once it has been placed because that will interrupt data and decrease adhesive wear time. ?Push the button when you have any symptoms and write down what you were feeling. ?Once you have completed wearing your monitor, remove and place into box which has postage paid and place in your outgoing mailbox.  ?If for some reason you have misplaced your box then call our office and we can provide another box and/or mail it off for you. ? ? ? ? ? ?Follow-Up: ?At [facility], you and your health needs are our priority.  As part of our continuing mission to provide you with exceptional heart care, we have created designated Provider Care Teams.  These Care Teams include your primary Cardiologist (physician) and Advanced Practice Providers (APPs -  Physician Assistants and Nurse Practitioners) who all work together to provide you with the care you need, when you need it. ? ?We recommend signing up for the patient portal called "MyChart".  Sign up information is provided on this After Visit Summary.  MyChart is used to connect with patients for Virtual Visits (Telemedicine).  Patients are able to view lab/test results, encounter notes, upcoming appointments, etc.  Non-urgent messages can be sent to your provider as well.   ?To  learn more about what you can do with MyChart, go to [email or web address].   ? ?Your next appointment:   ?6 week(s) ? ?The format for your next appointment:   ?In Person ? ?Provider:   ?You may see Nelva Bush, MD or one of the following Advanced Practice Providers on your designated Care Team:   ? ?[name], PA-C ? ? ? ?Other Instructions ?N/a ? ?

## 2021-08-21 DIAGNOSIS — R001 Bradycardia, unspecified: Secondary | ICD-10-CM | POA: Diagnosis not present

## 2021-08-23 DIAGNOSIS — H35352 Cystoid macular degeneration, left eye: Secondary | ICD-10-CM | POA: Diagnosis not present

## 2021-09-01 DIAGNOSIS — H35352 Cystoid macular degeneration, left eye: Secondary | ICD-10-CM | POA: Diagnosis not present

## 2021-09-02 DIAGNOSIS — H35352 Cystoid macular degeneration, left eye: Secondary | ICD-10-CM | POA: Diagnosis not present

## 2021-09-09 DIAGNOSIS — R001 Bradycardia, unspecified: Secondary | ICD-10-CM | POA: Diagnosis not present

## 2021-09-15 ENCOUNTER — Telehealth: Payer: Self-pay | Admitting: *Deleted

## 2021-09-15 NOTE — Telephone Encounter (Signed)
Patient made aware of zio results with verbalized understanding. ?

## 2021-09-15 NOTE — Telephone Encounter (Signed)
Follow Up:    Patient is returning Pam's call from today. 

## 2021-09-15 NOTE — Telephone Encounter (Signed)
Spoke with patients son per release form and he requested I call home number. He did advise she may not answer.  ? ?Called home number provided by son and there was no answer/No voicemail.  ?

## 2021-09-15 NOTE — Telephone Encounter (Signed)
-----   Message from Rise Mu, PA-C sent at 09/14/2021  7:17 AM EDT ----- ?Outpatient cardiac monitor showed a predominant rhythm of sinus with an average rate of 57 bpm (42 to 106 bpm in sinus), 1 episode of NSVT lasting 6 beats, 109 atrial runs lasting up to 14.4 seconds with a maximum rate of 193 bpm, occasional PACs with a burden of 1.1%, rare PVCs, no sustained arrhythmias or prolonged pauses, and no patient triggered events.  Bradycardic rates preclude escalation of rate controlling therapy.  At her recent office visit, symptoms were overall infrequent and it appears she was asymptomatic while wearing her Zio patch.  Follow-up as planned. ?

## 2021-09-22 ENCOUNTER — Other Ambulatory Visit: Payer: Self-pay | Admitting: Nurse Practitioner

## 2021-09-22 DIAGNOSIS — E1165 Type 2 diabetes mellitus with hyperglycemia: Secondary | ICD-10-CM

## 2021-10-02 NOTE — Progress Notes (Signed)
? ?Cardiology Office Note   ? ?Date:  10/03/2021  ? ?ID:  [name], DOB 01/09/1941, MRN [id number] ? ?PCP:  Jonetta Osgood, NP  ?Cardiologist:  Nelva Bush, MD  ?Electrophysiologist:  Vickie Epley, MD  ? ?Chief Complaint: Follow up ? ?History of Present Illness:  ? ?[name] is a 81 y.o. female with history of pSVT, valvular heart disease including mild to moderate mitral regurgitation, aortic insufficiency, tricuspid regurgitation, and mild aortic stenosis, vasovagal syncope, DM2, HTN, HLD, RA, and anemia who presents for follow-up of Zio patch.  ?  ?Prior echo, obtained through outside office, showed a hyperdynamic LVSF with an EF > 70%, moderate LVH, small left ventricle, normal RVSF and ventricular cavity size, mild AS, moderate AI, mild MR, moderate TR, mild pulmonary hypertension. She has since been followed at our [facility] office, noting stable exertional dyspnea and sporadic "twitches" of randomly occurring chest pain. ?  ?She underwent echo in 02/2020 for follow-up of her valvular heart disease which demonstrated an EF of 60 to 65%, no regional wall motion abnormalities, grade 1 diastolic dysfunction, mild LVH, normal RV systolic function and ventricular cavity size, mildly elevated PASP at 42.2 mmHg, mild mitral regurgitation, mild aortic valve insufficiency, mild to moderate aortic valve sclerosis without evidence of stenosis.  When compared to prior echo, her valvular heart disease was stable and her right-sided pressure was improved.  She was seen in the office in 04/2020 for follow-up of hypertension at which time she was doing relatively well.  She was tolerating the increased dose of losartan.  She did continue to note intermittent palpitations that would last a second or 2 without associated symptoms.  It was recommended to possibly discontinue hydralazine in follow-up for ease of dosing her medications if her blood pressure remained well controlled.  Following this visit,  there was some confusion regarding her HCTZ as it appeared this was discontinued in late 04/2020 though she continued to take this medication.  She was seen in 06/2020 and reported a syncopal episode in late 04/2020.  Outpatient cardiac monitoring demonstrated a predominant rhythm of sinus with an average rate of 66 bpm (range 47 to 120 bpm in sinus), a single 7 beat run of NSVT occurred with a maximum rate of 152 bpm, 157 episodes of SVT lasting up to 26.5 seconds with a maximum rate of 207 bpm, occasional PACs with a 1.8% burden, rare PVCs, and no patient triggered events.  She was subsequently evaluated by EP for her episode of syncope and frequent episodes of SVT noted on outpatient cardiac monitoring with noted inability to take a beta-blocker due to resting bradycardia.  EP felt her syncopal episode was consistent with vasovagal etiology.  With regards to her SVT, pharmacologic therapy was deferred given that she was asymptomatic with these runs, and in the context of resting bradycardia.  SVT was not felt to have contributed to her syncopal episode.  She was last seen in the office in 07/2021 and was without symptoms of angina or decompensation.  She did note occasional palpitations, mostly when stressed.  She was without further episodes of syncope.  She was bradycardic with a heart rate of 51 and a BP of 90/50, though asymptomatic.  She underwent outpatient cardiac monitoring which demonstrated a predominant rhythm of sinus with an average rate of 57 bpm (range 42 to 106 bpm in sinus), occasional PACs with a 1.1% burden, rare PVCs, 1 episode of NSVT lasting 6 beats with a maximum rate  of 116 bpm, 109 atrial runs lasting up to 14.7 seconds with a maximum rate of 193 bpm.  No sustained arrhythmias or prolonged pauses.  No patient triggered events. ? ?She comes in doing very well from a cardiac perspective and is without symptoms of angina or decompensation.  No palpitations, dizziness, presyncope, or syncope.   Her main limiting factor at this time is arthritis.  She reports she has been intermittently checking her blood pressures at home and has not noted any significantly elevated or low readings.  Her blood pressure is elevated in the office today, however she has not yet taken her morning medications.  She does not have any active cardiac issues or concerns at this time. ? ? ?Labs independently reviewed: ?07/2021 - potassium 3.6, BUN 31, SCr 1.19, magnesium 1.8, TSH normal ?06/2021 - A1c 6.8 ?03/2021 - Hgb 10.6, PLT 189, AST/ALT normal, albumin 3.9 ?10/2019 - TC 207, TG 49, HDL 79, LDL 118 ? ?Past Medical History:  ?Diagnosis Date  ? Anemia   ? Arthritis   ? Diabetes mellitus without complication ([place])   ? GERD (gastroesophageal reflux disease)   ? Heart murmur   ? Hyperlipidemia   ? Hypertension   ? Leaky heart valve   ? Rheumatoid arthritis ([place])   ? Wears dentures   ? ? ?Past Surgical History:  ?Procedure Laterality Date  ? ABDOMINAL HYSTERECTOMY    ? ANTERIOR VITRECTOMY Left 03/10/2020  ? Procedure: ANTERIOR VITRECTOMY;  Surgeon: Leandrew Koyanagi, MD;  Location: [place];  Service: Ophthalmology;  Laterality: Left;  ? CATARACT EXTRACTION    ? CATARACT EXTRACTION W/PHACO Left 03/10/2020  ? Procedure: CATARACT EXTRACTION PHACO  (IOC) LEFT  DIABETIC 16.27  02:09.7  12.5%;  Surgeon: Leandrew Koyanagi, MD;  Location: [place];  Service: Ophthalmology;  Laterality: Left;  Diabetic - oral meds  ? COLONOSCOPY WITH PROPOFOL N/A 10/25/2017  ? Procedure: COLONOSCOPY WITH PROPOFOL;  Surgeon: Lollie Sails, MD;  Location: [facility] ENDOSCOPY;  Service: Endoscopy;  Laterality: N/A;  ? ESOPHAGOGASTRODUODENOSCOPY (EGD) WITH PROPOFOL N/A 10/25/2017  ? Procedure: ESOPHAGOGASTRODUODENOSCOPY (EGD) WITH PROPOFOL;  Surgeon: Lollie Sails, MD;  Location: [facility] ENDOSCOPY;  Service: Endoscopy;  Laterality: N/A;  ? ESOPHAGOGASTRODUODENOSCOPY (EGD) WITH PROPOFOL N/A 03/04/2019  ? Procedure:  ESOPHAGOGASTRODUODENOSCOPY (EGD) WITH PROPOFOL;  Surgeon: Lollie Sails, MD;  [facility] ENDOSCOPY;  Service: Endoscopy;  Laterality: N/A;  ? EUS N/A 01/17/2018  ? Procedure: ESOPHAGEAL ENDOSCOPIC ULTRASOUND (EUS) RADIAL;  Surgeon: Reita Cliche, MD;  Location: [facility] ENDOSCOPY;  Service: Gastroenterology;  Laterality: N/A;  ? right side had fatty tissue taken off    ? TONSILLECTOMY    ? ? ?Current Medications: ?Current Meds  ?Medication Sig  ? acetaminophen (TYLENOL) 500 MG tablet Take 500-1,000 mg by mouth every 6 (six) hours as needed.  ? Alcohol Swabs (ALCOHOL PREP) PADS   ? amLODipine (NORVASC) 5 MG tablet TAKE 1 TABLET (5 mg)  BY MOUTH IN  THE EVENING  ? aspirin EC 81 MG tablet Take 1 tablet (81 mg total) by mouth daily.  ? atorvastatin (LIPITOR) 10 MG tablet TAKE 1 TABLET BY MOUTH AT  BEDTIME FOR HIGH  CHOLESTEROL  ? COMBIGAN 0.2-0.5 % ophthalmic solution Apply 1 drop to eye 2 (two) times daily. Left eye  ? Ferrous Gluconate (IRON 27 PO) Take 27 mg by mouth daily.   ? Ferrous Gluconate (IRON 27 PO) Take 27 mg by mouth daily.  ? folic acid (FOLVITE) 1 MG tablet TAKE 2 TABLETS  BY MOUTH  DAILY  ? glimepiride (AMARYL) 4 MG tablet TAKE ONE-HALF TABLET BY  MOUTH WITH BREAKFAST AND  ONE-HALF TABLET BY MOUTH  WITH DINNER  ? glucose blood (ONETOUCH VERIO) test strip Use as directed  For twice a day diag e11.65  ? Lancets (ONETOUCH DELICA PLUS [id number]) MISC USE AS DIRECTED TWICE DAILY  ? losartan (COZAAR) 100 MG tablet Take 1 tablet (100 mg total) by mouth daily.  ? methotrexate 2.5 MG tablet Take 15 mg by mouth once a week.   ? pantoprazole (PROTONIX) 40 MG tablet Take 1 tablet (40 mg total) by mouth daily. TAKE 1 TABLET DAILY FOR    REFLUX  ? Vitamin D, Ergocalciferol, (DRISDOL) 1.25 MG (50000 UNIT) CAPS capsule Take 1 capsule (50,000 Units total) by mouth once a week.  ? ? ?Allergies:   Patient has no known allergies.  ? ?Social History  ? ?Socioeconomic History  ? Marital status: Widowed  ?  Spouse name:  Not on file  ? Number of children: Not on file  ? Years of education: Not on file  ? Highest education level: Not on file  ?Occupational History  ? Not on file  ?Tobacco Use  ? Smoking status: Never  ? Smokeless tobacco: Ne

## 2021-10-03 ENCOUNTER — Encounter: Payer: Self-pay | Admitting: Physician Assistant

## 2021-10-03 ENCOUNTER — Ambulatory Visit (INDEPENDENT_AMBULATORY_CARE_PROVIDER_SITE_OTHER): Payer: Medicare Other | Admitting: Physician Assistant

## 2021-10-03 VITALS — BP 164/74 | HR 64 | Ht 62.0 in | Wt 138.0 lb

## 2021-10-03 DIAGNOSIS — I10 Essential (primary) hypertension: Secondary | ICD-10-CM | POA: Diagnosis not present

## 2021-10-03 DIAGNOSIS — E785 Hyperlipidemia, unspecified: Secondary | ICD-10-CM

## 2021-10-03 DIAGNOSIS — I471 Supraventricular tachycardia: Secondary | ICD-10-CM | POA: Diagnosis not present

## 2021-10-03 DIAGNOSIS — I38 Endocarditis, valve unspecified: Secondary | ICD-10-CM

## 2021-10-03 DIAGNOSIS — R55 Syncope and collapse: Secondary | ICD-10-CM

## 2021-10-03 DIAGNOSIS — R001 Bradycardia, unspecified: Secondary | ICD-10-CM | POA: Diagnosis not present

## 2021-10-03 NOTE — Patient Instructions (Signed)
Medication Instructions:  ?- Your physician recommends that you continue on your current medications as directed. Please refer to the Current Medication list given to you today. ? ?*If you need a refill on your cardiac medications before your next appointment, please call your pharmacy* ? ? ?Lab Work: ?- none ordered ? ?If you have labs (blood work) drawn today and your tests are completely normal, you will receive your results only by: ?MyChart Message (if you have MyChart) OR ?A paper copy in the mail ?If you have any lab test that is abnormal or we need to change your treatment, we will call you to review the results. ? ? ?Testing/Procedures: ?- none ordered ? ? ?Follow-Up: ?At [facility], you and your health needs are our priority.  As part of our continuing mission to provide you with exceptional heart care, we have created designated Provider Care Teams.  These Care Teams include your primary Cardiologist (physician) and Advanced Practice Providers (APPs -  Physician Assistants and Nurse Practitioners) who all work together to provide you with the care you need, when you need it. ? ?We recommend signing up for the patient portal called "MyChart".  Sign up information is provided on this After Visit Summary.  MyChart is used to connect with patients for Virtual Visits (Telemedicine).  Patients are able to view lab/test results, encounter notes, upcoming appointments, etc.  Non-urgent messages can be sent to your provider as well.   ?To learn more about what you can do with MyChart, go to [email or web address].   ? ?Your next appointment:   ?6 month(s) ? ?The format for your next appointment:   ?In Person ? ?Provider:   ?You may see Christopher End, MD or one of the following Advanced Practice Providers on your designated Care Team:   ? ?Ryan Dunn, PA-C ?  ? ? ?Other Instructions ?N/a ? ?Important Information About Sugar ? ? ? ? ? ? ?

## 2021-10-04 DIAGNOSIS — H35352 Cystoid macular degeneration, left eye: Secondary | ICD-10-CM | POA: Diagnosis not present

## 2021-10-27 ENCOUNTER — Ambulatory Visit: Payer: Medicare Other | Admitting: Nurse Practitioner

## 2021-10-28 ENCOUNTER — Ambulatory Visit: Payer: Medicare Other | Admitting: Nurse Practitioner

## 2021-11-17 ENCOUNTER — Encounter: Payer: Self-pay | Admitting: Nurse Practitioner

## 2021-11-17 ENCOUNTER — Ambulatory Visit (INDEPENDENT_AMBULATORY_CARE_PROVIDER_SITE_OTHER): Payer: Medicare Other | Admitting: Nurse Practitioner

## 2021-11-17 VITALS — BP 160/90 | HR 60 | Temp 98.2°F | Resp 16 | Ht 62.0 in | Wt 136.2 lb

## 2021-11-17 DIAGNOSIS — E1165 Type 2 diabetes mellitus with hyperglycemia: Secondary | ICD-10-CM | POA: Diagnosis not present

## 2021-11-17 DIAGNOSIS — I38 Endocarditis, valve unspecified: Secondary | ICD-10-CM

## 2021-11-17 DIAGNOSIS — R011 Cardiac murmur, unspecified: Secondary | ICD-10-CM

## 2021-11-17 DIAGNOSIS — E559 Vitamin D deficiency, unspecified: Secondary | ICD-10-CM | POA: Diagnosis not present

## 2021-11-17 DIAGNOSIS — M0579 Rheumatoid arthritis with rheumatoid factor of multiple sites without organ or systems involvement: Secondary | ICD-10-CM

## 2021-11-17 DIAGNOSIS — I10 Essential (primary) hypertension: Secondary | ICD-10-CM

## 2021-11-17 DIAGNOSIS — E611 Iron deficiency: Secondary | ICD-10-CM | POA: Diagnosis not present

## 2021-11-17 DIAGNOSIS — E538 Deficiency of other specified B group vitamins: Secondary | ICD-10-CM | POA: Diagnosis not present

## 2021-11-17 DIAGNOSIS — R300 Dysuria: Secondary | ICD-10-CM

## 2021-11-17 DIAGNOSIS — Z87898 Personal history of other specified conditions: Secondary | ICD-10-CM

## 2021-11-17 DIAGNOSIS — Z0001 Encounter for general adult medical examination with abnormal findings: Secondary | ICD-10-CM

## 2021-11-17 DIAGNOSIS — K219 Gastro-esophageal reflux disease without esophagitis: Secondary | ICD-10-CM | POA: Diagnosis not present

## 2021-11-17 LAB — POCT GLYCOSYLATED HEMOGLOBIN (HGB A1C): Hemoglobin A1C: 6.4 % — AB (ref 4.0–5.6)

## 2021-11-17 NOTE — Progress Notes (Unsigned)
[facility] [place], [place] [address]  Internal MEDICINE  Office Visit Note  Patient Name: [name]  [id number]  [id number]  Date of Service: 11/17/2021  Chief Complaint  Patient presents with   Medicare Wellness   Diabetes   Gastroesophageal Reflux   Hypertension   Hyperlipidemia   Anemia    HPI [name] presents for an annual well visit and physical exam. she has a history of ____.  -age appropriate screenings and immunizations as appropriate -COVID vacc status -current problems, concerns.  Medication refills Routine labs Work and home life:  Pain: Blood pressure elevated today but patient has not taken her medication for today.  She also has a history of GERD, hypertension, anemia and hyperlipidemia. She has slight improvement in her A1C today at 6.5. Her last A1C was 6.6 in may 2022.  -She has a history of rheumatoid arthritis, followed by rheumatology, takes methotrexate weekly. She is taking 2 mg folic acid daily to mitigate side effects of methotrexate and needs a refill of the folic acid.  -takes pantoprazole for GERD, works well for her 160/90 A1c 6.4  Current Medication: Outpatient Encounter Medications as of 11/17/2021  Medication Sig   acetaminophen (TYLENOL) 500 MG tablet Take 500-1,000 mg by mouth every 6 (six) hours as needed.   Alcohol Swabs (ALCOHOL PREP) PADS    amLODipine (NORVASC) 5 MG tablet TAKE 1 TABLET (5 mg)  BY MOUTH IN  THE EVENING   aspirin EC 81 MG tablet Take 1 tablet (81 mg total) by mouth daily.   atorvastatin (LIPITOR) 10 MG tablet TAKE 1 TABLET BY MOUTH AT  BEDTIME FOR HIGH  CHOLESTEROL   COMBIGAN 0.2-0.5 % ophthalmic solution Apply 1 drop to eye 2 (two) times daily. Left eye   Ferrous Gluconate (IRON 27 PO) Take 27 mg by mouth daily.    Ferrous Gluconate (IRON 27 PO) Take 27 mg by mouth daily.   folic acid (FOLVITE) 1 MG tablet TAKE 2 TABLETS BY MOUTH  DAILY   glimepiride (AMARYL) 4 MG tablet TAKE  ONE-HALF TABLET BY  MOUTH WITH BREAKFAST AND  ONE-HALF TABLET BY MOUTH  WITH DINNER   glucose blood (ONETOUCH VERIO) test strip Use as directed  For twice a day diag e11.65   Lancets (ONETOUCH DELICA PLUS [id number]) MISC USE AS DIRECTED TWICE DAILY   losartan (COZAAR) 100 MG tablet Take 1 tablet (100 mg total) by mouth daily.   methotrexate 2.5 MG tablet Take 15 mg by mouth once a week.    pantoprazole (PROTONIX) 40 MG tablet Take 1 tablet (40 mg total) by mouth daily. TAKE 1 TABLET DAILY FOR    REFLUX   Vitamin D, Ergocalciferol, (DRISDOL) 1.25 MG (50000 UNIT) CAPS capsule Take 1 capsule (50,000 Units total) by mouth once a week.   No facility-administered encounter medications on file as of 11/17/2021.    Surgical History: Past Surgical History:  Procedure Laterality Date   ABDOMINAL HYSTERECTOMY     ANTERIOR VITRECTOMY Left 03/10/2020   Procedure: ANTERIOR VITRECTOMY;  Surgeon: Leandrew Koyanagi, MD;  Location: [place];  Service: Ophthalmology;  Laterality: Left;   CATARACT EXTRACTION     CATARACT EXTRACTION W/PHACO Left 03/10/2020   Procedure: CATARACT EXTRACTION PHACO  (IOC) LEFT  DIABETIC 16.27  02:09.7  12.5%;  Surgeon: Leandrew Koyanagi, MD;  Location: [place];  Service: Ophthalmology;  Laterality: Left;  Diabetic - oral meds   COLONOSCOPY WITH PROPOFOL N/A 10/25/2017   Procedure: COLONOSCOPY WITH  PROPOFOL;  Surgeon: Lollie Sails, MD;  Location: [facility] ENDOSCOPY;  Service: Endoscopy;  Laterality: N/A;   ESOPHAGOGASTRODUODENOSCOPY (EGD) WITH PROPOFOL N/A 10/25/2017   Procedure: ESOPHAGOGASTRODUODENOSCOPY (EGD) WITH PROPOFOL;  Surgeon: Lollie Sails, MD;  [facility]COPY;  Service: Endoscopy;  Laterality: N/A;   ESOPHAGOGASTRODUODENOSCOPY (EGD) WITH PROPOFOL N/A 03/04/2019   Procedure: ESOPHAGOGASTRODUODENOSCOPY (EGD) WITH PROPOFOL;  Surgeon: Lollie Sails, MD;  Location: [facility] ENDOSCOPY;  Service: Endoscopy;  Laterality: N/A;   EUS N/A  01/17/2018   Procedure: ESOPHAGEAL ENDOSCOPIC ULTRASOUND (EUS) RADIAL;  Surgeon: Reita Cliche, MD;  Location: [facility] ENDOSCOPY;  Service: Gastroenterology;  Laterality: N/A;   right side had fatty tissue taken off     TONSILLECTOMY      Medical History: Past Medical History:  Diagnosis Date   Anemia    Arthritis    Diabetes mellitus without complication (HCC)    GERD (gastroesophageal reflux disease)    Heart murmur    Hyperlipidemia    Hypertension    Leaky heart valve    Rheumatoid arthritis (HCC)    Wears dentures     Family History: Family History  Problem Relation Age of Onset   Hypertension Mother    Diabetes Father    Heart disease Brother 5       Open heart surgery (details unknown)   Breast cancer Neg Hx     Social History   Socioeconomic History   Marital status: Widowed    Spouse name: Not on file   Number of children: Not on file   Years of education: Not on file   Highest education level: Not on file  Occupational History   Not on file  Tobacco Use   Smoking status: Never   Smokeless tobacco: Never  Vaping Use   Vaping Use: Never used  Substance and Sexual Activity   Alcohol use: No   Drug use: No   Sexual activity: Not on file  Other Topics Concern   Not on file  Social History Narrative   Not on file   Social Determinants of Health   Financial Resource Strain: Not on file  Food Insecurity: Not on file  Transportation Needs: Not on file  Physical Activity: Not on file  Stress: Not on file  Social Connections: Not on file  Intimate Partner Violence: Not on file      Review of Systems  Constitutional:  Negative for activity change, appetite change, chills, fatigue, fever and unexpected weight change.  HENT: Negative.  Negative for congestion, ear pain, rhinorrhea, sore throat and trouble swallowing.   Eyes: Negative.   Respiratory: Negative.  Negative for cough, chest tightness, shortness of breath and wheezing.   Cardiovascular:  Negative.  Negative for chest pain.  Gastrointestinal: Negative.  Negative for abdominal pain, blood in stool, constipation, diarrhea, nausea and vomiting.  Endocrine: Negative.   Genitourinary: Negative.  Negative for difficulty urinating, dysuria, frequency, hematuria and urgency.  Musculoskeletal: Negative.  Negative for arthralgias, back pain, joint swelling, myalgias and neck pain.  Skin: Negative.  Negative for rash and wound.  Allergic/Immunologic: Negative.  Negative for immunocompromised state.  Neurological: Negative.  Negative for dizziness, seizures, numbness and headaches.  Hematological: Negative.   Psychiatric/Behavioral: Negative.  Negative for behavioral problems, self-injury and suicidal ideas. The patient is not nervous/anxious.     Vital Signs: BP (!) 192/90   Pulse (!) 56   Temp 98.2 F (36.8 C)   Resp 16   Ht 5' 2" (1.575 m)  Wt 136 lb 3.2 oz (61.8 kg)   SpO2 98%   BMI 24.91 kg/m    Physical Exam Vitals reviewed.  Constitutional:      General: She is not in acute distress.    Appearance: Normal appearance. She is well-developed and normal weight. She is not ill-appearing or diaphoretic.  HENT:     Head: Normocephalic and atraumatic.     Right Ear: Tympanic membrane, ear canal and external ear normal.     Left Ear: Tympanic membrane, ear canal and external ear normal.     Nose: Nose normal.     Mouth/Throat:     Pharynx: No oropharyngeal exudate.  Eyes:     General: No scleral icterus.       Right eye: No discharge.        Left eye: No discharge.     Conjunctiva/sclera: Conjunctivae normal.     Pupils: Pupils are equal, round, and reactive to light.  Neck:     Thyroid: No thyromegaly.     Vascular: No JVD.     Trachea: No tracheal deviation.  Cardiovascular:     Rate and Rhythm: Normal rate and regular rhythm.     Pulses:          Dorsalis pedis pulses are 2+ on the right side and 2+ on the left side.       Posterior tibial pulses are 1+ on the  right side and 1+ on the left side.     Heart sounds: Murmur heard.     No friction rub. No gallop.  Pulmonary:     Effort: Pulmonary effort is normal. No respiratory distress.     Breath sounds: Normal breath sounds. No stridor. No wheezing or rales.  Chest:     Chest wall: No tenderness.  Abdominal:     General: Bowel sounds are normal. There is no distension.     Palpations: Abdomen is soft. There is no mass.     Tenderness: There is no abdominal tenderness. There is no guarding or rebound.  Musculoskeletal:        General: No tenderness or deformity. Normal range of motion.     Cervical back: Normal range of motion and neck supple.  Feet:     Right foot:     Skin integrity: Dry skin present. No ulcer, blister, skin breakdown, erythema, warmth, callus or fissure.     Toenail Condition: Right toenails are abnormally thick and long. Fungal disease present.    Left foot:     Skin integrity: Dry skin present.     Toenail Condition: Left toenails are abnormally thick and long. Fungal disease present. Lymphadenopathy:     Cervical: No cervical adenopathy.  Skin:    General: Skin is warm and dry.     Coloration: Skin is not pale.     Findings: No erythema or rash.  Neurological:     Mental Status: She is alert.     Cranial Nerves: No cranial nerve deficit.     Motor: No abnormal muscle tone.     Coordination: Coordination normal.     Deep Tendon Reflexes: Reflexes are normal and symmetric.  Psychiatric:        Behavior: Behavior normal.        Thought Content: Thought content normal.        Judgment: Judgment normal.        Assessment/Plan: 1. Type 2 diabetes mellitus with hyperglycemia, without long-term current use of insulin (HCC) -  POCT HgB A1C - CMP14+EGFR - Lipid Profile  2. Dysuria - UA/M w/rflx Culture, Routine  3. Essential hypertension  4. Gastroesophageal reflux disease without esophagitis  5. Encounter for general adult medical examination with  abnormal findings  6. Valvular heart disease  7. Rheumatoid arthritis involving multiple sites with positive rheumatoid factor ([place])  8. Vitamin D deficiency - Vitamin D (25 hydroxy)  9. History of syncope  10. Murmur, cardiac  11. B12 deficiency - CBC with Differential/Platelet - Iron, TIBC and Ferritin Panel - B12 and Folate Panel  12. Iron deficiency - CBC with Differential/Platelet - Iron, TIBC and Ferritin Panel - B12 and Folate Panel     General Counseling: [name] verbalizes understanding of the findings of todays visit and agrees with plan of treatment. I have discussed any further diagnostic evaluation that may be needed or ordered today. We also reviewed her medications today. she has been encouraged to call the office with any questions or concerns that should arise related to todays visit.    Orders Placed This Encounter  Procedures   UA/M w/rflx Culture, Routine   CBC with Differential/Platelet   CMP14+EGFR   Lipid Profile   Iron, TIBC and Ferritin Panel   B12 and Folate Panel   Vitamin D (25 hydroxy)   POCT HgB A1C    No orders of the defined types were placed in this encounter.   Return in about 3 months (around 02/17/2022) for F/U, Recheck A1C,  PCP.   Total time spent:*** Minutes Time spent includes review of chart, medications, test results, and follow up plan with the patient.   [place] Controlled Substance Database was reviewed by me.  This patient was seen by Jonetta Osgood, FNP-C in collaboration with Dr. Clayborn Bigness as a part of collaborative care agreement.   R. Valetta Fuller, MSN, FNP-C Internal medicine

## 2021-11-18 ENCOUNTER — Encounter: Payer: Self-pay | Admitting: Nurse Practitioner

## 2021-11-18 LAB — UA/M W/RFLX CULTURE, ROUTINE
Bilirubin, UA: NEGATIVE
Glucose, UA: NEGATIVE
Ketones, UA: NEGATIVE
Leukocytes,UA: NEGATIVE
Nitrite, UA: NEGATIVE
RBC, UA: NEGATIVE
Specific Gravity, UA: 1.013 (ref 1.005–1.030)
Urobilinogen, Ur: 0.2 mg/dL (ref 0.2–1.0)
pH, UA: 8 — ABNORMAL HIGH (ref 5.0–7.5)

## 2021-11-18 LAB — MICROSCOPIC EXAMINATION
Bacteria, UA: NONE SEEN
Casts: NONE SEEN /lpf
Epithelial Cells (non renal): NONE SEEN /hpf (ref 0–10)
RBC, Urine: NONE SEEN /hpf (ref 0–2)
WBC, UA: NONE SEEN /hpf (ref 0–5)

## 2021-12-06 DIAGNOSIS — H35352 Cystoid macular degeneration, left eye: Secondary | ICD-10-CM | POA: Diagnosis not present

## 2021-12-28 ENCOUNTER — Emergency Department: Payer: Medicare Other

## 2021-12-28 ENCOUNTER — Observation Stay: Payer: Medicare Other

## 2021-12-28 ENCOUNTER — Observation Stay
Admission: EM | Admit: 2021-12-28 | Discharge: 2021-12-30 | Disposition: A | Discharge status: Skilled Nursing Facility | Payer: Medicare Other | Attending: Internal Medicine | Admitting: Internal Medicine

## 2021-12-28 DIAGNOSIS — I160 Hypertensive urgency: Secondary | ICD-10-CM | POA: Diagnosis not present

## 2021-12-28 DIAGNOSIS — E1165 Type 2 diabetes mellitus with hyperglycemia: Secondary | ICD-10-CM | POA: Diagnosis not present

## 2021-12-28 DIAGNOSIS — R2689 Other abnormalities of gait and mobility: Secondary | ICD-10-CM | POA: Insufficient documentation

## 2021-12-28 DIAGNOSIS — R000 Tachycardia, unspecified: Secondary | ICD-10-CM | POA: Diagnosis not present

## 2021-12-28 DIAGNOSIS — N1831 Chronic kidney disease, stage 3a: Secondary | ICD-10-CM | POA: Diagnosis not present

## 2021-12-28 DIAGNOSIS — I471 Supraventricular tachycardia: Secondary | ICD-10-CM

## 2021-12-28 DIAGNOSIS — J069 Acute upper respiratory infection, unspecified: Secondary | ICD-10-CM | POA: Diagnosis not present

## 2021-12-28 DIAGNOSIS — M0579 Rheumatoid arthritis with rheumatoid factor of multiple sites without organ or systems involvement: Secondary | ICD-10-CM | POA: Diagnosis present

## 2021-12-28 DIAGNOSIS — E1122 Type 2 diabetes mellitus with diabetic chronic kidney disease: Secondary | ICD-10-CM | POA: Insufficient documentation

## 2021-12-28 DIAGNOSIS — Z7984 Long term (current) use of oral hypoglycemic drugs: Secondary | ICD-10-CM | POA: Insufficient documentation

## 2021-12-28 DIAGNOSIS — Z79899 Other long term (current) drug therapy: Secondary | ICD-10-CM | POA: Insufficient documentation

## 2021-12-28 DIAGNOSIS — I499 Cardiac arrhythmia, unspecified: Secondary | ICD-10-CM | POA: Diagnosis not present

## 2021-12-28 DIAGNOSIS — R404 Transient alteration of awareness: Secondary | ICD-10-CM | POA: Diagnosis not present

## 2021-12-28 DIAGNOSIS — R1110 Vomiting, unspecified: Secondary | ICD-10-CM | POA: Diagnosis not present

## 2021-12-28 DIAGNOSIS — Z7982 Long term (current) use of aspirin: Secondary | ICD-10-CM | POA: Insufficient documentation

## 2021-12-28 DIAGNOSIS — R6889 Other general symptoms and signs: Secondary | ICD-10-CM | POA: Diagnosis not present

## 2021-12-28 DIAGNOSIS — R531 Weakness: Secondary | ICD-10-CM | POA: Diagnosis not present

## 2021-12-28 DIAGNOSIS — I129 Hypertensive chronic kidney disease with stage 1 through stage 4 chronic kidney disease, or unspecified chronic kidney disease: Secondary | ICD-10-CM | POA: Insufficient documentation

## 2021-12-28 DIAGNOSIS — Z743 Need for continuous supervision: Secondary | ICD-10-CM | POA: Diagnosis not present

## 2021-12-28 DIAGNOSIS — R29818 Other symptoms and signs involving the nervous system: Secondary | ICD-10-CM | POA: Diagnosis not present

## 2021-12-28 DIAGNOSIS — Z20822 Contact with and (suspected) exposure to covid-19: Secondary | ICD-10-CM | POA: Diagnosis not present

## 2021-12-28 DIAGNOSIS — I38 Endocarditis, valve unspecified: Secondary | ICD-10-CM | POA: Diagnosis present

## 2021-12-28 LAB — CBC WITH DIFFERENTIAL/PLATELET
Abs Immature Granulocytes: 0.02 10*3/uL (ref 0.00–0.07)
Basophils Absolute: 0.1 10*3/uL (ref 0.0–0.1)
Basophils Relative: 1 %
Eosinophils Absolute: 0.3 10*3/uL (ref 0.0–0.5)
Eosinophils Relative: 3 %
HCT: 38.9 % (ref 36.0–46.0)
Hemoglobin: 12.3 g/dL (ref 12.0–15.0)
Immature Granulocytes: 0 %
Lymphocytes Relative: 13 %
Lymphs Abs: 1.2 10*3/uL (ref 0.7–4.0)
MCH: 26.6 pg (ref 26.0–34.0)
MCHC: 31.6 g/dL (ref 30.0–36.0)
MCV: 84.2 fL (ref 80.0–100.0)
Monocytes Absolute: 0.6 10*3/uL (ref 0.1–1.0)
Monocytes Relative: 7 %
Neutro Abs: 6.9 10*3/uL (ref 1.7–7.7)
Neutrophils Relative %: 76 %
Platelets: 207 10*3/uL (ref 150–400)
RBC: 4.62 MIL/uL (ref 3.87–5.11)
RDW: 15.6 % — ABNORMAL HIGH (ref 11.5–15.5)
WBC: 9 10*3/uL (ref 4.0–10.5)
nRBC: 0 % (ref 0.0–0.2)

## 2021-12-28 LAB — RESP PANEL BY RT-PCR (FLU A&B, COVID) ARPGX2
Influenza A by PCR: NEGATIVE
Influenza B by PCR: NEGATIVE
SARS Coronavirus 2 by RT PCR: NEGATIVE

## 2021-12-28 LAB — LIPASE, BLOOD: Lipase: 33 U/L (ref 11–51)

## 2021-12-28 LAB — URINALYSIS, ROUTINE W REFLEX MICROSCOPIC
Bacteria, UA: NONE SEEN
Bilirubin Urine: NEGATIVE
Glucose, UA: 50 mg/dL — AB
Hgb urine dipstick: NEGATIVE
Ketones, ur: NEGATIVE mg/dL
Leukocytes,Ua: NEGATIVE
Nitrite: NEGATIVE
Protein, ur: 100 mg/dL — AB
Specific Gravity, Urine: 1.009 (ref 1.005–1.030)
pH: 6 (ref 5.0–8.0)

## 2021-12-28 LAB — COMPREHENSIVE METABOLIC PANEL
ALT: 14 U/L (ref 0–44)
AST: 22 U/L (ref 15–41)
Albumin: 4.1 g/dL (ref 3.5–5.0)
Alkaline Phosphatase: 89 U/L (ref 38–126)
Anion gap: 13 (ref 5–15)
BUN: 25 mg/dL — ABNORMAL HIGH (ref 8–23)
CO2: 24 mmol/L (ref 22–32)
Calcium: 9.3 mg/dL (ref 8.9–10.3)
Chloride: 102 mmol/L (ref 98–111)
Creatinine, Ser: 1.28 mg/dL — ABNORMAL HIGH (ref 0.44–1.00)
GFR, Estimated: 42 mL/min — ABNORMAL LOW (ref >60)
Glucose, Bld: 185 mg/dL — ABNORMAL HIGH (ref 70–99)
Potassium: 3.6 mmol/L (ref 3.5–5.1)
Sodium: 139 mmol/L (ref 135–145)
Total Bilirubin: 0.8 mg/dL (ref 0.3–1.2)
Total Protein: 7.7 g/dL (ref 6.5–8.1)

## 2021-12-28 LAB — CBG MONITORING, ED: Glucose-Capillary: 198 mg/dL — ABNORMAL HIGH (ref 70–99)

## 2021-12-28 LAB — TROPONIN I (HIGH SENSITIVITY): Troponin I (High Sensitivity): 10 ng/L (ref <18)

## 2021-12-28 MED ORDER — ASPIRIN 81 MG PO TBEC
81.0000 mg | DELAYED_RELEASE_TABLET | Freq: Every day | ORAL | Status: DC
  Administered 2021-12-29 – 2021-12-30 (×2): 81 mg via ORAL
  Filled 2021-12-28 (×2): qty 1

## 2021-12-28 MED ORDER — PANTOPRAZOLE SODIUM 40 MG PO TBEC
40.0000 mg | DELAYED_RELEASE_TABLET | Freq: Every day | ORAL | Status: DC
  Administered 2021-12-29 – 2021-12-30 (×2): 40 mg via ORAL
  Filled 2021-12-28 (×2): qty 1

## 2021-12-28 MED ORDER — ACETAMINOPHEN 650 MG RE SUPP: 650.0000 mg | Freq: Four times a day (QID) | RECTAL | Status: DC | PRN

## 2021-12-28 MED ORDER — ACETAMINOPHEN 325 MG PO TABS: 650.0000 mg | ORAL_TABLET | Freq: Four times a day (QID) | ORAL | Status: DC | PRN

## 2021-12-28 MED ORDER — FOLIC ACID 1 MG PO TABS
2.0000 mg | ORAL_TABLET | Freq: Every day | ORAL | Status: DC
  Administered 2021-12-29 – 2021-12-30 (×2): 2 mg via ORAL
  Filled 2021-12-28 (×2): qty 2

## 2021-12-28 MED ORDER — ENOXAPARIN SODIUM 30 MG/0.3ML IJ SOSY
30.0000 mg | PREFILLED_SYRINGE | INTRAMUSCULAR | Status: DC
  Administered 2021-12-29 (×2): 30 mg via SUBCUTANEOUS
  Filled 2021-12-28 (×2): qty 0.3

## 2021-12-28 MED ORDER — SODIUM CHLORIDE 0.9% FLUSH
3.0000 mL | Freq: Two times a day (BID) | INTRAVENOUS | Status: DC
  Administered 2021-12-29 (×3): 3 mL via INTRAVENOUS

## 2021-12-28 MED ORDER — SODIUM CHLORIDE 0.9 % IV BOLUS
500.0000 mL | Freq: Once | INTRAVENOUS | Status: AC
  Administered 2021-12-28: 500 mL via INTRAVENOUS

## 2021-12-28 MED ORDER — METHOTREXATE 2.5 MG PO TABS: 15.0000 mg | ORAL_TABLET | ORAL | Status: DC

## 2021-12-28 MED ORDER — INSULIN ASPART 100 UNIT/ML IJ SOLN
0.0000 [IU] | Freq: Three times a day (TID) | INTRAMUSCULAR | Status: DC
  Administered 2021-12-29 – 2021-12-30 (×4): 2 [IU] via SUBCUTANEOUS
  Administered 2021-12-30: 3 [IU] via SUBCUTANEOUS
  Filled 2021-12-28 (×4): qty 1

## 2021-12-28 MED ORDER — ONDANSETRON HCL 4 MG/2ML IJ SOLN
4.0000 mg | Freq: Once | INTRAMUSCULAR | Status: AC
  Administered 2021-12-28: 4 mg via INTRAVENOUS
  Filled 2021-12-28: qty 2

## 2021-12-28 MED ORDER — BRIMONIDINE TARTRATE-TIMOLOL 0.2-0.5 % OP SOLN
1.0000 [drp] | Freq: Two times a day (BID) | OPHTHALMIC | Status: DC
Start: 2021-12-28 — End: 2021-12-29

## 2021-12-28 MED ORDER — AMLODIPINE BESYLATE 5 MG PO TABS
5.0000 mg | ORAL_TABLET | Freq: Every day | ORAL | Status: DC
Start: 2021-12-28 — End: 2021-12-30
  Administered 2021-12-29 – 2021-12-30 (×3): 5 mg via ORAL
  Filled 2021-12-28 (×3): qty 1

## 2021-12-28 MED ORDER — ACETAMINOPHEN 325 MG PO TABS
650.0000 mg | ORAL_TABLET | Freq: Once | ORAL | Status: AC
  Administered 2021-12-28: 650 mg via ORAL
  Filled 2021-12-28: qty 2

## 2021-12-28 MED ORDER — CLONIDINE HCL 0.1 MG PO TABS
0.1000 mg | ORAL_TABLET | Freq: Once | ORAL | Status: AC
  Administered 2021-12-28: 0.1 mg via ORAL
  Filled 2021-12-28: qty 1

## 2021-12-28 MED ORDER — LOSARTAN POTASSIUM 50 MG PO TABS
100.0000 mg | ORAL_TABLET | Freq: Every day | ORAL | Status: DC
  Administered 2021-12-29 – 2021-12-30 (×2): 100 mg via ORAL
  Filled 2021-12-28 (×2): qty 2

## 2021-12-28 MED ORDER — SODIUM CHLORIDE 0.9 % IV SOLN: INTRAVENOUS | Status: AC

## 2021-12-28 MED ORDER — INSULIN ASPART 100 UNIT/ML IJ SOLN: 0.0000 [IU] | Freq: Every day | INTRAMUSCULAR | Status: DC

## 2021-12-28 MED ORDER — AMLODIPINE BESYLATE 5 MG PO TABS
5.0000 mg | ORAL_TABLET | Freq: Once | ORAL | Status: AC
  Administered 2021-12-28: 5 mg via ORAL
  Filled 2021-12-28: qty 1

## 2021-12-28 NOTE — ED Triage Notes (Signed)
First Nurse Note:  Pt via EMS from home. Pt c/o nausea, vomiting, lightheadedness starting today she was sitting when this happened. Denies any recent injury. 242/92, 53 HR, Pt has taken her medication today. Denies chest pain or discomfort. Pt is A&OX4 and NAD  18 G L AC, EMS gave '4mg'$  of Zofran

## 2021-12-28 NOTE — Assessment & Plan Note (Addendum)
SBP 240 soon after arrival, improving with home antihypertensives Continue to monitor blood pressure We will continue amlodipine and losartan.  Will hold home hydrochlorothiazide Hydralazine as needed to keep systolic under 480.  We will try to avoid clonidine.  Got a dose in the ED

## 2021-12-28 NOTE — Progress Notes (Signed)
Anticoagulation monitoring(Lovenox):  81 yo  female ordered Lovenox 40 mg Q24h    Filed Weights   12/28/21 1856  Weight: 61.7 kg (136 lb)   BMI    Lab Results  Component Value Date   CREATININE 1.28 (H) 12/28/2021   CREATININE 1.19 (H) 08/17/2021   CREATININE 1.16 (H) 05/25/2020   Estimated Creatinine Clearance: 29.3 mL/min (A) (by C-G formula based on SCr of 1.28 mg/dL (H)). Hemoglobin & Hematocrit     Component Value Date/Time   HGB 12.3 12/28/2021 1905   HCT 38.9 12/28/2021 1905     Per Protocol for Patient with estCrcl < 30 ml/min and BMI < 40, will transition to Lovenox 30 mg Q24h.

## 2021-12-28 NOTE — ED Notes (Signed)
Pt taken to room 25 and assisted onto stretcher, gown and monitor on with external catheter in place with new, dry brief.

## 2021-12-28 NOTE — Assessment & Plan Note (Signed)
Blood sugar 185.  Follow A1c Sliding scale insulin coverage

## 2021-12-28 NOTE — Assessment & Plan Note (Addendum)
Near baseline, slightly above

## 2021-12-28 NOTE — Assessment & Plan Note (Signed)
Continuous cardiac monitoring overnight Patient follows with [facility] cardiology.  Last seen in June Has baseline bradycardia and is not currently on a rate control agent

## 2021-12-28 NOTE — Assessment & Plan Note (Signed)
Last cardiology note reviewed.  Patient has intermittently reported stable exertional dyspnea and sporadic twitches of randomly occurring chest pain Patient denies complaints at this time

## 2021-12-28 NOTE — H&P (Signed)
History and Physical    Patient: [name] UMP:[id number] DOB: 1940-12-03 DOA: 12/28/2021 DOS: the patient was seen and examined on 12/28/2021 PCP: Jonetta Osgood, NP  Patient coming from: Home  Chief Complaint:  Chief Complaint  Patient presents with   Emesis   Hypertension   Dizziness    HPI: [name] is a 81 y.o. female with medical history significant for HTN, DM, OA, GAD, PSVT, valvulopathy (mild to moderate MR, AI, TR and mild AS), history of vasovagal syncope, CKD 3A, independent at baseline, who presents to the ED with sudden onset severe weakness, with inability getting up out of her recliner requiring 2 person assist.  She states for the past two weeks she has had a cough productive of clear phlegm and then the day prior she vomited twice and had no appetite for the next full day and had little oral intake along with intermittent lightheadedness and headache. She denies fever or chills, palpitations, chest pain or shortness of breath and denies visual disturbance, one-sided numbness weakness or tingling.  Denies abdominal pain, vomiting, diarrhea or dysuria. ED course and data review: BP as high as 244/80 in the ED, pulse mostly in the mid 50s with otherwise normal vitals. Labs: CBC and CMP mostly unremarkable with creatinine of 1.28 which is about her baseline.  COVID and flu negative and urinalysis unremarkable.  Troponin of 10 EKG, personally viewed and interpreted: Sinus bradycardia at 59 with no acute ST-T wave changes CT head showed chronic small vessel disease without acute intracranial abnormality Chest x-ray not done  Patient treated with her home amlodipine and clonidine with improved movement and systolic to 400 by admission.  Observation requested.     Past Medical History:  Diagnosis Date   Anemia    Arthritis    Diabetes mellitus without complication (HCC)    GERD (gastroesophageal reflux disease)    Heart murmur    Hyperlipidemia     Hypertension    Leaky heart valve    Rheumatoid arthritis ([place])    Wears dentures    Past Surgical History:  Procedure Laterality Date   ABDOMINAL HYSTERECTOMY     ANTERIOR VITRECTOMY Left 03/10/2020   Procedure: ANTERIOR VITRECTOMY;  Surgeon: Leandrew Koyanagi, MD;  Location: [facility];  Service: Ophthalmology;  Laterality: Left;   CATARACT EXTRACTION     CATARACT EXTRACTION W/PHACO Left 03/10/2020   Procedure: CATARACT EXTRACTION PHACO  (IOC) LEFT  DIABETIC 16.27  02:09.7  12.5%;  Surgeon: Leandrew Koyanagi, MD;  Location: [facility];  Service: Ophthalmology;  Laterality: Left;  Diabetic - oral meds   COLONOSCOPY WITH PROPOFOL N/A 10/25/2017   Procedure: COLONOSCOPY WITH PROPOFOL;  Surgeon: Lollie Sails, MD;  Location: [facility] ENDOSCOPY;  Service: Endoscopy;  Laterality: N/A;   ESOPHAGOGASTRODUODENOSCOPY (EGD) WITH PROPOFOL N/A 10/25/2017   Procedure: ESOPHAGOGASTRODUODENOSCOPY (EGD) WITH PROPOFOL;  Surgeon: Lollie Sails, MD;  Location: [facility] ENDOSCOPY;  Service: Endoscopy;  Laterality: N/A;   ESOPHAGOGASTRODUODENOSCOPY (EGD) WITH PROPOFOL N/A 03/04/2019   Procedure: ESOPHAGOGASTRODUODENOSCOPY (EGD) WITH PROPOFOL;  Surgeon: Lollie Sails, MD;  Location: [facility] ENDOSCOPY;  Service: Endoscopy;  Laterality: N/A;   EUS N/A 01/17/2018   Procedure: ESOPHAGEAL ENDOSCOPIC ULTRASOUND (EUS) RADIAL;  Surgeon: Reita Cliche, MD;  Location: [facility] ENDOSCOPY;  Service: Gastroenterology;  Laterality: N/A;   right side had fatty tissue taken off     TONSILLECTOMY     Social History:  reports that she has never smoked. She has never used smokeless tobacco. She  reports that she does not drink alcohol and does not use drugs.  No Known Allergies  Family History  Problem Relation Age of Onset   Hypertension Mother    Diabetes Father    Heart disease Brother 42       Open heart surgery (details unknown)   Breast cancer Neg Hx     Prior to Admission medications    Medication Sig Start Date End Date Taking? Authorizing Provider  acetaminophen (TYLENOL) 500 MG tablet Take 500-1,000 mg by mouth every 6 (six) hours as needed.    [provider]  Alcohol Swabs (ALCOHOL PREP) PADS     [provider]  amLODipine (NORVASC) 5 MG tablet TAKE 1 TABLET (5 mg)  BY MOUTH IN  THE EVENING 08/17/21   Dunn, Areta Haber, PA-C  aspirin EC 81 MG tablet Take 1 tablet (81 mg total) by mouth daily. 01/26/20   Dunn, Areta Haber, PA-C  atorvastatin (LIPITOR) 10 MG tablet TAKE 1 TABLET BY MOUTH AT  BEDTIME FOR HIGH  CHOLESTEROL 06/08/21   Nicholes Rough N, PA-C  COMBIGAN 0.2-0.5 % ophthalmic solution Apply 1 drop to eye 2 (two) times daily. Left eye 07/19/21   [provider]  Ferrous Gluconate (IRON 27 PO) Take 27 mg by mouth daily.     [provider]  Ferrous Gluconate (IRON 27 PO) Take 27 mg by mouth daily.    [provider]  folic acid (FOLVITE) 1 MG tablet TAKE 2 TABLETS BY MOUTH  DAILY 03/25/21   Abernathy, Yetta Flock, NP  glimepiride (AMARYL) 4 MG tablet TAKE ONE-HALF TABLET BY  MOUTH WITH BREAKFAST AND  ONE-HALF TABLET BY MOUTH  WITH DINNER 09/23/21   Jonetta Osgood, NP  glucose blood (ONETOUCH VERIO) test strip Use as directed  For twice a day diag e11.65 07/22/21   Jonetta Osgood, NP  Lancets (ONETOUCH DELICA PLUS [id number]) MISC USE AS DIRECTED TWICE DAILY 03/25/21   Jonetta Osgood, NP  losartan (COZAAR) 100 MG tablet Take 1 tablet (100 mg total) by mouth daily. 06/08/21   Elgie Collard, PA-C  methotrexate 2.5 MG tablet Take 15 mg by mouth once a week.     [provider]  pantoprazole (PROTONIX) 40 MG tablet Take 1 tablet (40 mg total) by mouth daily. TAKE 1 TABLET DAILY FOR    REFLUX 07/22/21   Jonetta Osgood, NP  Vitamin D, Ergocalciferol, (DRISDOL) 1.25 MG (50000 UNIT) CAPS capsule Take 1 capsule (50,000 Units total) by mouth once a week. 05/17/21   Jonetta Osgood, NP    Physical Exam: Vitals:   12/28/21 2117  12/28/21 2130 12/28/21 2200 12/28/21 2226  BP: (!) 225/76 (!) 217/79 (!) 192/70 (!) 189/77  Pulse:  (!) 56 (!) 55 66  Resp:  (!) 21 (!) 25 19  Temp:      TempSrc:      SpO2:  96% 96% 98%  Weight:      Height:       Physical Exam Vitals and nursing note reviewed.  Constitutional:      General: She is not in acute distress.    Comments: Frail appearing elderly female  HENT:     Head: Normocephalic and atraumatic.  Cardiovascular:     Rate and Rhythm: Normal rate and regular rhythm.     Heart sounds: Normal heart sounds.  Pulmonary:     Effort: Pulmonary effort is normal.     Breath sounds: Normal breath sounds.  Abdominal:     Palpations:  Abdomen is soft.     Tenderness: There is no abdominal tenderness.  Neurological:     Mental Status: Mental status is at baseline.     Labs on Admission: I have personally reviewed following labs and imaging studies  CBC: Recent Labs  Lab 12/28/21 1905  WBC 9.0  NEUTROABS 6.9  HGB 12.3  HCT 38.9  MCV 84.2  PLT 017   Basic Metabolic Panel: Recent Labs  Lab 12/28/21 1905  NA 139  K 3.6  CL 102  CO2 24  GLUCOSE 185*  BUN 25*  CREATININE 1.28*  CALCIUM 9.3   GFR: Estimated Creatinine Clearance: 29.3 mL/min (A) (by C-G formula based on SCr of 1.28 mg/dL (H)). Liver Function Tests: Recent Labs  Lab 12/28/21 1905  AST 22  ALT 14  ALKPHOS 89  BILITOT 0.8  PROT 7.7  ALBUMIN 4.1   Recent Labs  Lab 12/28/21 1905  LIPASE 33   No results for input(s): "AMMONIA" in the last 168 hours. Coagulation Profile: No results for input(s): "INR", "PROTIME" in the last 168 hours. Cardiac Enzymes: No results for input(s): "CKTOTAL", "CKMB", "CKMBINDEX", "TROPONINI" in the last 168 hours. BNP (last 3 results) No results for input(s): "PROBNP" in the last 8760 hours. HbA1C: No results for input(s): "HGBA1C" in the last 72 hours. CBG: No results for input(s): "GLUCAP" in the last 168 hours. Lipid Profile: No results for  input(s): "CHOL", "HDL", "LDLCALC", "TRIG", "CHOLHDL", "LDLDIRECT" in the last 72 hours. Thyroid Function Tests: No results for input(s): "TSH", "T4TOTAL", "FREET4", "T3FREE", "THYROIDAB" in the last 72 hours. Anemia Panel: No results for input(s): "VITAMINB12", "FOLATE", "FERRITIN", "TIBC", "IRON", "RETICCTPCT" in the last 72 hours. Urine analysis:    Component Value Date/Time   COLORURINE STRAW (A) 12/28/2021 1938   APPEARANCEUR CLEAR (A) 12/28/2021 1938   APPEARANCEUR Clear 11/17/2021 0901   LABSPEC 1.009 12/28/2021 1938   PHURINE 6.0 12/28/2021 1938   GLUCOSEU 50 (A) 12/28/2021 1938   HGBUR NEGATIVE 12/28/2021 1938   BILIRUBINUR NEGATIVE 12/28/2021 1938   BILIRUBINUR Negative 11/17/2021 0901   KETONESUR NEGATIVE 12/28/2021 1938   PROTEINUR 100 (A) 12/28/2021 1938   NITRITE NEGATIVE 12/28/2021 1938   LEUKOCYTESUR NEGATIVE 12/28/2021 1938    Radiological Exams on Admission: CT HEAD WO CONTRAST (5MM)  Result Date: 12/28/2021 CLINICAL DATA:  Acute neurologic deficit EXAM: CT HEAD WITHOUT CONTRAST TECHNIQUE: Contiguous axial images were obtained from the base of the skull through the vertex without intravenous contrast. RADIATION DOSE REDUCTION: This exam was performed according to the departmental dose-optimization program which includes automated exposure control, adjustment of the mA and/or kV according to patient size and/or use of iterative reconstruction technique. COMPARISON:  None Available. FINDINGS: Brain: There is no mass, hemorrhage or extra-axial collection. The size and configuration of the ventricles and extra-axial CSF spaces are normal. There is hypoattenuation of the white matter, most commonly indicating chronic small vessel disease. Old left thalamic small vessel infarct. Vascular: Atherosclerotic calcification of the internal carotid arteries at the skull base. No abnormal hyperdensity of the major intracranial arteries or dural venous sinuses. Skull: The visualized skull  base, calvarium and extracranial soft tissues are normal. Sinuses/Orbits: No fluid levels or advanced mucosal thickening of the visualized paranasal sinuses. No mastoid or middle ear effusion. The orbits are normal. IMPRESSION: Chronic small vessel disease without acute intracranial abnormality. Electronically Signed   By: Ulyses Jarred M.D.   On: 12/28/2021 21:53     Data Reviewed: Relevant notes from primary care and specialist  visits, past discharge summaries as available in EHR, including Care Everywhere. Prior diagnostic testing as pertinent to current admission diagnoses Updated medications and problem lists for reconciliation ED course, including vitals, labs, imaging, treatment and response to treatment Triage notes, nursing and pharmacy notes and ED provider's notes Notable results as noted in HPI   Assessment and Plan: * Generalized weakness Etiology uncertain.  No focal neurologic finding.  No stigmata of infection We will monitor overnight with continuous cardiac monitoring and neurologic checks CT head was negative Follow up CXR, Covid Patient received an IV fluid bolus in the ED. Will continue IV hydration given decreased oral intake We will get an echocardiogram PT evaluation  Hypertensive urgency SBP 240 soon after arrival, improving with home antihypertensives Continue to monitor blood pressure We will continue amlodipine and losartan.  Will hold home hydrochlorothiazide Hydralazine as needed to keep systolic under 759.  We will try to avoid clonidine.  Got a dose in the ED  Vomiting Appears resolved. Will get CT abd to eval for intrabdominal pathology as other workup unremarkable Clear liquids Nutritionist eval  Uncontrolled type 2 diabetes mellitus with hyperglycemia, without long-term current use of insulin (HCC) Blood sugar 185.  Follow A1c Sliding scale insulin coverage  PSVT (paroxysmal supraventricular tachycardia) (HCC) Continuous cardiac monitoring  overnight Patient [facility] cardiology.  Last seen in June Has baseline bradycardia and is not currently on a rate control agent  Stage 3a chronic kidney disease ([place]) Near baseline, slightly above  Valvular heart disease Last cardiology note reviewed.  Patient has intermittently reported stable exertional dyspnea and sporadic twitches of randomly occurring chest pain Patient denies complaints at this time  Rheumatoid arthritis involving multiple sites with positive rheumatoid factor (HCC) On methotrexate weekly and folic acid        DVT prophylaxis: Lovenox  Consults: none  Advance Care Planning:   Code Status: Not on file full code  Family Communication: none  Disposition Plan: Back to previous home environment  Severity of Illness: The appropriate patient status for this patient is OBSERVATION. Observation status is judged to be reasonable and necessary in order to provide the required intensity of service to ensure the patient's safety. The patient's presenting symptoms, physical exam findings, and initial radiographic and laboratory data in the context of their medical condition is felt to place them at decreased risk for further clinical deterioration. Furthermore, it is anticipated that the patient will be medically stable for discharge from the hospital within 2 midnights of admission.   Author: Athena Masse, MD 12/28/2021 11:07 PM  For on call review [email or web address].

## 2021-12-28 NOTE — ED Provider Triage Note (Signed)
Emergency Medicine Provider Triage Evaluation Note  CAPTOLA TESCHNER , a 81 y.o. female  was evaluated in triage.  Pt complains of NV, and dizziness.  She presents to the ED via EMS from home where she had sudden onset of symptoms while at rest today.  She denies any recent injury, trauma, fall.  Patient was taken home medication but she denies any chest pain or shortness of breath.  Review of Systems  Positive: NV, dizzness Negative: FCS  Physical Exam  There were no vitals taken for this visit. Gen:   Awake, no distress  NAD Resp:  Normal effort CTA MSK:   Moves extremities without difficulty  Other:  Soft, nontender  Medical Decision Making  Medically screening exam initiated at 6:55 PM.  Appropriate orders placed.  Thereasa Parkin was informed that the remainder of the evaluation will be completed by another provider, this initial triage assessment does not replace that evaluation, and the importance of remaining in the ED until their evaluation is complete.  Geriatric female to the ED from home via EMS.  Patient complains of sudden onset of nausea and vomiting with lightheadedness today.  He denies any abdominal discomfort, chest pain, or shortness of breath.   Melvenia Needles, PA-C 12/28/21 1857

## 2021-12-28 NOTE — Assessment & Plan Note (Signed)
On methotrexate weekly and folic acid

## 2021-12-28 NOTE — ED Triage Notes (Signed)
See First RN note.  Pt c/o lightheadedness and n/v starting today.  Pt noted to be hypertensive.  Sts she isn't sure if she takes her medication correctly.

## 2021-12-28 NOTE — Assessment & Plan Note (Addendum)
Etiology uncertain.  No focal neurologic finding.  No stigmata of infection We will monitor overnight with continuous cardiac monitoring and neurologic checks CT head was negative Patient received an IV fluid bolus in the ED We will get an echocardiogram PT evaluation

## 2021-12-29 ENCOUNTER — Encounter: Payer: Self-pay | Admitting: Internal Medicine

## 2021-12-29 ENCOUNTER — Observation Stay (HOSPITAL_BASED_OUTPATIENT_CLINIC_OR_DEPARTMENT_OTHER)
Admit: 2021-12-29 | Discharge: 2021-12-29 | Disposition: A | Discharge status: Home / Self Care | Payer: Medicare Other | Attending: Internal Medicine | Admitting: Internal Medicine

## 2021-12-29 ENCOUNTER — Observation Stay: Payer: Medicare Other

## 2021-12-29 ENCOUNTER — Other Ambulatory Visit: Payer: Self-pay

## 2021-12-29 ENCOUNTER — Observation Stay: Admit: 2021-12-29 | Payer: Medicare Other

## 2021-12-29 DIAGNOSIS — R1110 Vomiting, unspecified: Secondary | ICD-10-CM

## 2021-12-29 DIAGNOSIS — K8020 Calculus of gallbladder without cholecystitis without obstruction: Secondary | ICD-10-CM | POA: Diagnosis not present

## 2021-12-29 DIAGNOSIS — R112 Nausea with vomiting, unspecified: Secondary | ICD-10-CM | POA: Diagnosis not present

## 2021-12-29 DIAGNOSIS — M0579 Rheumatoid arthritis with rheumatoid factor of multiple sites without organ or systems involvement: Secondary | ICD-10-CM | POA: Diagnosis not present

## 2021-12-29 DIAGNOSIS — R42 Dizziness and giddiness: Secondary | ICD-10-CM | POA: Diagnosis not present

## 2021-12-29 DIAGNOSIS — I351 Nonrheumatic aortic (valve) insufficiency: Secondary | ICD-10-CM

## 2021-12-29 DIAGNOSIS — K5730 Diverticulosis of large intestine without perforation or abscess without bleeding: Secondary | ICD-10-CM | POA: Diagnosis not present

## 2021-12-29 DIAGNOSIS — R531 Weakness: Secondary | ICD-10-CM | POA: Diagnosis not present

## 2021-12-29 DIAGNOSIS — I160 Hypertensive urgency: Secondary | ICD-10-CM | POA: Diagnosis not present

## 2021-12-29 DIAGNOSIS — N1831 Chronic kidney disease, stage 3a: Secondary | ICD-10-CM | POA: Diagnosis not present

## 2021-12-29 DIAGNOSIS — J069 Acute upper respiratory infection, unspecified: Secondary | ICD-10-CM | POA: Diagnosis not present

## 2021-12-29 DIAGNOSIS — J9811 Atelectasis: Secondary | ICD-10-CM | POA: Diagnosis not present

## 2021-12-29 LAB — IRON AND TIBC
Iron: 49 ug/dL (ref 28–170)
Saturation Ratios: 20 % (ref 10.4–31.8)
TIBC: 249 ug/dL — ABNORMAL LOW (ref 250–450)
UIBC: 200 ug/dL

## 2021-12-29 LAB — TSH: TSH: 1.041 u[IU]/mL (ref 0.350–4.500)

## 2021-12-29 LAB — FERRITIN: Ferritin: 141 ng/mL (ref 11–307)

## 2021-12-29 LAB — CBC
HCT: 40.6 % (ref 36.0–46.0)
Hemoglobin: 12.9 g/dL (ref 12.0–15.0)
MCH: 26.6 pg (ref 26.0–34.0)
MCHC: 31.8 g/dL (ref 30.0–36.0)
MCV: 83.7 fL (ref 80.0–100.0)
Platelets: 225 10*3/uL (ref 150–400)
RBC: 4.85 MIL/uL (ref 3.87–5.11)
RDW: 15.5 % (ref 11.5–15.5)
WBC: 7.8 10*3/uL (ref 4.0–10.5)
nRBC: 0 % (ref 0.0–0.2)

## 2021-12-29 LAB — ECHOCARDIOGRAM COMPLETE
AR max vel: 4.2 cm2
AV Area VTI: 2.68 cm2
AV Area mean vel: 2.53 cm2
AV Mean grad: 10.3 mmHg
AV Peak grad: 18.8 mmHg
Ao pk vel: 2.17 m/s
Area-P 1/2: 1.98 cm2
Height: 62 in
MV VTI: 2.09 cm2
S' Lateral: 2.1 cm
Weight: 2088.2 oz

## 2021-12-29 LAB — VITAMIN D 25 HYDROXY (VIT D DEFICIENCY, FRACTURES): Vit D, 25-Hydroxy: 87.8 ng/mL (ref 30–100)

## 2021-12-29 LAB — CREATININE, SERUM
Creatinine, Ser: 1.17 mg/dL — ABNORMAL HIGH (ref 0.44–1.00)
GFR, Estimated: 47 mL/min — ABNORMAL LOW (ref >60)

## 2021-12-29 LAB — VITAMIN B12: Vitamin B-12: 231 pg/mL (ref 180–914)

## 2021-12-29 LAB — GLUCOSE, CAPILLARY
Glucose-Capillary: 165 mg/dL — ABNORMAL HIGH (ref 70–99)
Glucose-Capillary: 139 mg/dL — ABNORMAL HIGH (ref 70–99)
Glucose-Capillary: 146 mg/dL — ABNORMAL HIGH (ref 70–99)
Glucose-Capillary: 197 mg/dL — ABNORMAL HIGH (ref 70–99)
Glucose-Capillary: 151 mg/dL — ABNORMAL HIGH (ref 70–99)

## 2021-12-29 LAB — TROPONIN I (HIGH SENSITIVITY): Troponin I (High Sensitivity): 12 ng/L (ref <18)

## 2021-12-29 MED ORDER — HYDRALAZINE HCL 20 MG/ML IJ SOLN
5.0000 mg | INTRAMUSCULAR | Status: DC | PRN
  Administered 2021-12-29: 5 mg via INTRAVENOUS
  Filled 2021-12-29: qty 1

## 2021-12-29 MED ORDER — METHOTREXATE 2.5 MG PO TABS: 15.0000 mg | ORAL_TABLET | ORAL | Status: DC

## 2021-12-29 MED ORDER — ONDANSETRON HCL 4 MG/2ML IJ SOLN
4.0000 mg | Freq: Four times a day (QID) | INTRAMUSCULAR | Status: DC | PRN
  Administered 2021-12-29: 4 mg via INTRAVENOUS
  Filled 2021-12-29: qty 2

## 2021-12-29 MED ORDER — TIMOLOL MALEATE 0.5 % OP SOLN
1.0000 [drp] | Freq: Two times a day (BID) | OPHTHALMIC | Status: DC
Start: 2021-12-29 — End: 2021-12-29
  Filled 2021-12-29: qty 5

## 2021-12-29 MED ORDER — HYDROCHLOROTHIAZIDE 25 MG PO TABS
25.0000 mg | ORAL_TABLET | Freq: Every day | ORAL | Status: DC
  Administered 2021-12-29 – 2021-12-30 (×2): 25 mg via ORAL
  Filled 2021-12-29 (×2): qty 1

## 2021-12-29 MED ORDER — HYDROCORTISONE 1 % EX CREA
TOPICAL_CREAM | Freq: Four times a day (QID) | CUTANEOUS | Status: DC
  Filled 2021-12-29: qty 28

## 2021-12-29 MED ORDER — BRIMONIDINE TARTRATE 0.2 % OP SOLN
1.0000 [drp] | Freq: Two times a day (BID) | OPHTHALMIC | Status: DC
Start: 2021-12-29 — End: 2021-12-29
  Administered 2021-12-29: 1 [drp] via OPHTHALMIC
  Filled 2021-12-29: qty 5

## 2021-12-29 MED ORDER — LORAZEPAM 2 MG/ML IJ SOLN
0.5000 mg | Freq: Once | INTRAMUSCULAR | Status: AC | PRN
Start: 2021-12-29 — End: 2021-12-29
  Administered 2021-12-29: 0.5 mg via INTRAVENOUS
  Filled 2021-12-29: qty 1

## 2021-12-29 MED ORDER — BRIMONIDINE TARTRATE 0.2 % OP SOLN
1.0000 [drp] | Freq: Two times a day (BID) | OPHTHALMIC | Status: DC
  Administered 2021-12-29: 1 [drp] via OPHTHALMIC
  Filled 2021-12-29: qty 5

## 2021-12-29 MED ORDER — TIMOLOL MALEATE 0.5 % OP SOLN
1.0000 [drp] | Freq: Two times a day (BID) | OPHTHALMIC | Status: DC
  Administered 2021-12-29: 1 [drp] via OPHTHALMIC
  Filled 2021-12-29: qty 5

## 2021-12-29 MED ORDER — SODIUM CHLORIDE 0.9 % IV BOLUS
500.0000 mL | Freq: Once | INTRAVENOUS | Status: AC
  Administered 2021-12-29: 500 mL via INTRAVENOUS

## 2021-12-29 NOTE — Progress Notes (Signed)
Notified Dr Damita Dunnings of elevated blood pressure 205/73

## 2021-12-29 NOTE — ED Notes (Signed)
Duncan MD at bedside 

## 2021-12-29 NOTE — Plan of Care (Signed)

## 2021-12-29 NOTE — Progress Notes (Signed)
Received new order for elevated blood pressure

## 2021-12-29 NOTE — NC FL2 (Signed)
[place] LEVEL OF CARE SCREENING TOOL     IDENTIFICATION  Patient Name: [name] Birthdate: April 21, 1941 Sex: female Admission Date (Current Location): 12/28/2021  [facility] and [place] Number:  Engineering geologist and Address:  [facility], [address] [place], [place] [address]      Provider Number: [id number]  Attending Physician Name and Address:  Jennye Boroughs, MD  Relative Name and Phone Number:  Lennette Bihari [phone]    Current Level of Care: Hospital Recommended Level of Care: [place] Prior Approval Number:    Date Approved/Denied:   PASRR Number: [id number]  Discharge Plan: SNF    Current Diagnoses: Patient Active Problem List   Diagnosis Date Noted   Vomiting 12/29/2021   Generalized weakness 12/28/2021   Hypertensive urgency 12/28/2021   Stage 3a chronic kidney disease ([place]) 12/28/2021   Vasovagal syncope 09/09/2020   PSVT (paroxysmal supraventricular tachycardia) ([place]) 09/09/2020   Needs flu shot 05/09/2020   Encounter for general adult medical examination with abnormal findings 10/26/2019   Need for vaccination against Streptococcus pneumoniae using pneumococcal conjugate vaccine 13 10/26/2019   Dysuria 10/26/2019   DOE (dyspnea on exertion) 10/18/2019   Chest pain of uncertain etiology 38/18/2993   Unintentional weight loss 02/25/2019   Valvular heart disease 02/14/2019   Hyperdynamic circulation 02/14/2019   Type 2 diabetes mellitus with hyperglycemia ([place]) 01/20/2019   Murmur, cardiac 01/20/2019   Need for shingles vaccine 01/20/2019   Screening for breast cancer 10/22/2018   Hematoma 08/23/2018   Cellulitis 08/23/2018   Hyperlipidemia LDL goal <100 08/01/2018   Vitamin D deficiency 08/01/2018   Impingement syndrome of shoulder region 07/25/2018   Other fatigue 07/25/2018   Generalized anxiety disorder 04/23/2018   Uncontrolled type 2 diabetes mellitus with hyperglycemia ([place])  07/25/2017   Osteoarthritis of knee 07/17/2017   Shoulder pain 07/17/2017   Essential hypertension 07/17/2017   Uncontrolled type 2 diabetes mellitus with hyperglycemia, without long-term current use of insulin ([place]) 09/06/2016   Rheumatoid arthritis involving multiple sites with positive rheumatoid factor ([place]) 09/06/2016    Orientation RESPIRATION BLADDER Height & Weight     Self, Time, Situation, Place  Normal Continent Weight: 59.2 kg Height:  '5\' 2"'$  (157.5 cm)  BEHAVIORAL SYMPTOMS/MOOD NEUROLOGICAL BOWEL NUTRITION STATUS      Continent Diet (see dc summary)  AMBULATORY STATUS COMMUNICATION OF NEEDS Skin   Extensive Assist Verbally Normal                       Personal Care Assistance Level of Assistance  Bathing, Feeding, Dressing Bathing Assistance: Limited assistance Feeding assistance: Independent Dressing Assistance: Limited assistance     Functional Limitations Info             SPECIAL CARE FACTORS FREQUENCY  PT (By licensed PT)     PT Frequency: 5 times per week              Contractures Contractures Info: Not present    Additional Factors Info  Code Status, Allergies Code Status Info: full code Allergies Info: NKDA           Current Medications (12/29/2021):  This is the current hospital active medication list Current Facility-Administered Medications  Medication Dose Route Frequency Provider Last Rate Last Admin   acetaminophen (TYLENOL) tablet 650 mg  650 mg Oral Q6H PRN Athena Masse, MD       Or   acetaminophen (TYLENOL) suppository 650 mg  650 mg Rectal Q6H PRN Athena Masse, MD       amLODipine (NORVASC) tablet 5 mg  5 mg Oral Daily Athena Masse, MD   5 mg at 12/29/21 0093   aspirin EC tablet 81 mg  81 mg Oral Daily Judd Gaudier V, MD   81 mg at 12/29/21 0940   brimonidine (ALPHAGAN) 0.2 % ophthalmic solution 1 drop  1 drop Left Eye BID Athena Masse, MD   1 drop at 12/29/21 0940   And   timolol (TIMOPTIC) 0.5 % ophthalmic  solution 1 drop  1 drop Left Eye BID Athena Masse, MD   1 drop at 12/29/21 0939   enoxaparin (LOVENOX) injection 30 mg  30 mg Subcutaneous Q24H Judd Gaudier V, MD   30 mg at 81/82/99 3716   folic acid (FOLVITE) tablet 2 mg  2 mg Oral Daily Athena Masse, MD   2 mg at 12/29/21 9678   hydrALAZINE (APRESOLINE) injection 5 mg  5 mg Intravenous Q4H PRN Athena Masse, MD   5 mg at 12/29/21 9381   hydrochlorothiazide (HYDRODIURIL) tablet 25 mg  25 mg Oral Daily Jennye Boroughs, MD   25 mg at 12/29/21 0939   insulin aspart (novoLOG) injection 0-15 Units  0-15 Units Subcutaneous TID WC Athena Masse, MD   2 Units at 12/29/21 0175   insulin aspart (novoLOG) injection 0-5 Units  0-5 Units Subcutaneous QHS Athena Masse, MD       losartan (COZAAR) tablet 100 mg  100 mg Oral Daily Judd Gaudier V, MD   100 mg at 12/29/21 0938   [START ON 12/31/2021] methotrexate (RHEUMATREX) tablet 15 mg  15 mg Oral Q Sat Judd Gaudier V, MD       ondansetron [facility]) injection 4 mg  4 mg Intravenous Q6H PRN Sharion Settler, NP   4 mg at 12/29/21 0744   pantoprazole (PROTONIX) EC tablet 40 mg  40 mg Oral Daily Judd Gaudier V, MD   40 mg at 12/29/21 1025   sodium chloride flush (NS) 0.9 % injection 3 mL  3 mL Intravenous Q12H Athena Masse, MD   3 mL at 12/29/21 0940     Discharge Medications: Please see discharge summary for a list of discharge medications.  Relevant Imaging Results:  Relevant Lab Results:   Additional Information SS# [id number]  Conception Oms, RN

## 2021-12-29 NOTE — Care Management Obs Status (Signed)
[place] NOTIFICATION   Patient Details  Name: [name] MRN: [id number] Date of Birth: 06-05-1940   Medicare Observation Status Notification Given:  Yes    Conception Oms, RN 12/29/2021, 8:45 AM

## 2021-12-29 NOTE — TOC Progression Note (Signed)
Transition of Care [facility]) - Progression Note    Patient Details  Name: [name] MRN: [id number] Date of Birth: March 29, 1941  Transition of Care [facility]) CM/SW [place], RN Phone Number: 12/29/2021, 12:10 PM  Clinical Narrative:     Spoke with the patient and the son in the room, they are agreeable to a bedsearch to go to [facility]       Expected Discharge Plan and Services                                                 Social Determinants of Health (SDOH) Interventions    Readmission Risk Interventions     No data to display

## 2021-12-29 NOTE — Progress Notes (Signed)
*  PRELIMINARY RESULTS* Echocardiogram 2D Echocardiogram has been performed.  [name] 12/29/2021, 2:40 PM

## 2021-12-29 NOTE — Progress Notes (Addendum)
Progress Note    [name]  NUU:[id number] DOB: 11-22-1940  DOA: 12/28/2021 PCP: Jonetta Osgood, NP      Brief Narrative:    Medical records reviewed and are as summarized below:  Thereasa Parkin is a 81 y.o. female with medical history significant for HTN, DM, OA, GAD, PSVT, valvulopathy (mild to moderate MR, AI, TR and mild AS), history of vasovagal syncope, CKD 3A, independent at baseline, who presented to the hospital because of generalized weakness, headache and dizziness/lightheadedness.  She said she felt so weak she could not even get out of her recliner.  She reported a cough that has been going on for about 2 weeks.  Cough is productive of clear phlegm.  She vomited about a day prior to admission from coughing.  Her blood pressure was 244/80 in the emergency room.  She said she is medically adherent with her medications.  She was admitted to the hospital for generalized weakness and hypertensive urgency.  She probably has upper respiratory tract infection as well.   Assessment/Plan:   Principal Problem:   Generalized weakness Active Problems:   Hypertensive urgency   Uncontrolled type 2 diabetes mellitus with hyperglycemia, without long-term current use of insulin (HCC)   Vomiting   PSVT (paroxysmal supraventricular tachycardia) (HCC)   Rheumatoid arthritis involving multiple sites with positive rheumatoid factor (HCC)   Valvular heart disease   Stage 3a chronic kidney disease (HCC)    Body mass index is 23.87 kg/m.  Hypertensive urgency: BP is better.  Continue amlodipine, HCTZ and losartan.  Generalized weakness, dizziness: MRI brain ordered today did not show any acute stroke.  Check iron, vitamin B12 and vitamin D levels, PT and OT recommended discharge to SNF for further rehabilitation.  Probable upper respiratory infection: Robitussin as needed for cough.  Recent vomiting: Improved  Type II DM with hyperglycemia: NovoLog as needed for  hyperglycemia  Other comorbidities include valvular heart disease, CKD stage IIIa, Rheumatoid arthritis   Diet Order             Diet heart healthy/carb modified Room service appropriate? Yes; Fluid consistency: Thin  Diet effective now                            Consultants: None   Procedures: None    Medications:    amLODipine  5 mg Oral Daily   aspirin EC  81 mg Oral Daily   brimonidine  1 drop Left Eye BID   And   timolol  1 drop Left Eye BID   enoxaparin (LOVENOX) injection  30 mg Subcutaneous H47Q   folic acid  2 mg Oral Daily   hydrochlorothiazide  25 mg Oral Daily   insulin aspart  0-15 Units Subcutaneous TID WC   insulin aspart  0-5 Units Subcutaneous QHS   losartan  100 mg Oral Daily   [START ON 12/31/2021] methotrexate  15 mg Oral Q Sat   pantoprazole  40 mg Oral Daily   sodium chloride flush  3 mL Intravenous Q12H   Continuous Infusions:   Anti-infectives (From admission, onward)    None              Family Communication/Anticipated D/C date and plan/Code Status   DVT prophylaxis: enoxaparin (LOVENOX) injection 30 mg Start: 12/28/21 2330     Code Status: Full Code  Family Communication: Plan of care was discussed with her son, Lennette Bihari, at the  bedside Disposition Plan: Plan to discharge to SNF in 2 to 3 days   Status is: Observation The patient will require care spanning > 2 midnights and should be moved to inpatient because: Generalized weakness requiring placement to SNF       Subjective:    Interval events noted.  She complains of generalized weakness and lightheadedness.  She feels a little better today.  Objective:    Vitals:   12/29/21 0447 12/29/21 0500 12/29/21 0749 12/29/21 1140  BP: (!) 149/76  (!) 146/66 (!) 130/57  Pulse: 65  69 (!) 54  Resp: 14     Temp: 97.8 F (36.6 C)  98.8 F (37.1 C) 98.8 F (37.1 C)  TempSrc:      SpO2: 99%  100% 99%  Weight:  59.2 kg    Height:       Orthostatic  VS for the past 24 hrs:  BP- Lying Pulse- Lying BP- Sitting Pulse- Sitting  12/29/21 0057 (!) 205/73 55 194/80 53     Intake/Output Summary (Last 24 hours) at 12/29/2021 1325 Last data filed at 12/29/2021 0447 Gross per 24 hour  Intake --  Output 0 ml  Net 0 ml   Filed Weights   12/28/21 1856 12/29/21 0500  Weight: 61.7 kg 59.2 kg    Exam:  GEN: NAD SKIN: No rash EYES: EOMI ENT: MMM CV: RRR PULM: CTA B ABD: soft, ND, NT, +BS CNS: AAO x 3, non focal EXT: No edema or tenderness        Data Reviewed:   I have personally reviewed following labs and imaging studies:  Labs: Labs show the following:   Basic Metabolic Panel: Recent Labs  Lab 12/28/21 1905 12/29/21 0010  NA 139  --   K 3.6  --   CL 102  --   CO2 24  --   GLUCOSE 185*  --   BUN 25*  --   CREATININE 1.28* 1.17*  CALCIUM 9.3  --    GFR Estimated Creatinine Clearance: 29.3 mL/min (A) (by C-G formula based on SCr of 1.17 mg/dL (H)). Liver Function Tests: Recent Labs  Lab 12/28/21 1905  AST 22  ALT 14  ALKPHOS 89  BILITOT 0.8  PROT 7.7  ALBUMIN 4.1   Recent Labs  Lab 12/28/21 1905  LIPASE 33   No results for input(s): "AMMONIA" in the last 168 hours. Coagulation profile No results for input(s): "INR", "PROTIME" in the last 168 hours.  CBC: Recent Labs  Lab 12/28/21 1905 12/29/21 0010  WBC 9.0 7.8  NEUTROABS 6.9  --   HGB 12.3 12.9  HCT 38.9 40.6  MCV 84.2 83.7  PLT 207 225   Cardiac Enzymes: No results for input(s): "CKTOTAL", "CKMB", "CKMBINDEX", "TROPONINI" in the last 168 hours. BNP (last 3 results) No results for input(s): "PROBNP" in the last 8760 hours. CBG: Recent Labs  Lab 12/28/21 2346 12/29/21 0605 12/29/21 0750 12/29/21 1151  GLUCAP 198* 165* 139* 146*   D-Dimer: No results for input(s): "DDIMER" in the last 72 hours. Hgb A1c: No results for input(s): "HGBA1C" in the last 72 hours. Lipid Profile: No results for input(s): "CHOL", "HDL", "LDLCALC",  "TRIG", "CHOLHDL", "LDLDIRECT" in the last 72 hours. Thyroid function studies: Recent Labs    12/29/21 0010  TSH 1.041   Anemia work up: No results for input(s): "VITAMINB12", "FOLATE", "FERRITIN", "TIBC", "IRON", "RETICCTPCT" in the last 72 hours. Sepsis Labs: Recent Labs  Lab 12/28/21 1905 12/29/21 0010  WBC 9.0 7.8  Microbiology Recent Results (from the past 240 hour(s))  Resp Panel by RT-PCR (Flu A&B, Covid) Anterior Nasal Swab     Status: None   Collection Time: 12/28/21  7:05 PM   Specimen: Anterior Nasal Swab  Result Value Ref Range Status   SARS Coronavirus 2 by RT PCR NEGATIVE NEGATIVE Final    Comment: (NOTE) SARS-CoV-2 target nucleic acids are NOT DETECTED.  The SARS-CoV-2 RNA is generally detectable in upper respiratory specimens during the acute phase of infection. The lowest concentration of SARS-CoV-2 viral copies this assay can detect is 138 copies/mL. A negative result does not preclude SARS-Cov-2 infection and should not be used as the sole basis for treatment or other patient management decisions. A negative result may occur with  improper specimen collection/handling, submission of specimen other than nasopharyngeal swab, presence of viral mutation(s) within the areas targeted by this assay, and inadequate number of viral copies(<138 copies/mL). A negative result must be combined with clinical observations, patient history, and epidemiological information. The expected result is Negative.  Fact Sheet for Patients:  EntrepreneurPulse.com.au  Fact Sheet for Healthcare Providers:  IncredibleEmployment.be  This test is no t yet approved or cleared by the [place] FDA and  has been authorized for detection and/or diagnosis of SARS-CoV-2 by FDA under an Emergency Use Authorization (EUA). This EUA will remain  in effect (meaning this test can be used) for the duration of the COVID-19 declaration under Section  564(b)(1) of the Act, 21 U.S.C.section 360bbb-3(b)(1), unless the authorization is terminated  or revoked sooner.       Influenza A by PCR NEGATIVE NEGATIVE Final   Influenza B by PCR NEGATIVE NEGATIVE Final    Comment: (NOTE) The Xpert Xpress SARS-CoV-2/FLU/RSV plus assay is intended as an aid in the diagnosis of influenza from Nasopharyngeal swab specimens and should not be used as a sole basis for treatment. Nasal washings and aspirates are unacceptable for Xpert Xpress SARS-CoV-2/FLU/RSV testing.  Fact Sheet for Patients: EntrepreneurPulse.com.au  Fact Sheet for Healthcare Providers: IncredibleEmployment.be  This test is not yet approved or cleared by the [place] FDA and has been authorized for detection and/or diagnosis of SARS-CoV-2 by FDA under an Emergency Use Authorization (EUA). This EUA will remain in effect (meaning this test can be used) for the duration of the COVID-19 declaration under Section 564(b)(1) of the Act, 21 U.S.C. section 360bbb-3(b)(1), unless the authorization is terminated or revoked.  Performed at [facility], [address], [place], [place] [address]     Procedures and diagnostic studies:  MR BRAIN WO CONTRAST  Result Date: 12/29/2021 CLINICAL DATA:  Dizziness. EXAM: MRI HEAD WITHOUT CONTRAST TECHNIQUE: Multiplanar, multiecho pulse sequences of the brain and surrounding structures were obtained without intravenous contrast. COMPARISON:  CT head 12/28/2021 FINDINGS: Brain: Ventricle size and cerebral volume normal for age. Negative for acute infarct. Mild to moderate chronic microvascular ischemic change in the white matter. Chronic infarct left thalamus and body of corpus callosum. Brainstem and cerebellum intact. Negative for hemorrhage or mass. Vascular: Normal arterial flow voids at the skull base. Skull and upper cervical spine: Negative Sinuses/Orbits: Paranasal sinus clear. Bilateral  cataract extraction Other: None IMPRESSION: No acute abnormality and no cause for dizziness Mild to moderate chronic microvascular ischemic change. Electronically Signed   By: Franchot Gallo M.D.   On: 12/29/2021 13:00   CT ABDOMEN PELVIS WO CONTRAST  Result Date: 12/29/2021 CLINICAL DATA:  Nausea and vomiting EXAM: CT ABDOMEN AND PELVIS WITHOUT CONTRAST TECHNIQUE: Multidetector CT  imaging of the abdomen and pelvis was performed following the standard protocol without IV contrast. RADIATION DOSE REDUCTION: This exam was performed according to the departmental dose-optimization program which includes automated exposure control, adjustment of the mA and/or kV according to patient size and/or use of iterative reconstruction technique. COMPARISON:  12/05/2005 FINDINGS: Lower chest: No acute abnormality.  Mild scarring is noted. Hepatobiliary: Liver is within normal limits. Gallbladder is well distended with multiple dependent gallstones. No biliary ductal abnormality is noted. Pancreas: Unremarkable. No pancreatic ductal dilatation or surrounding inflammatory changes. Spleen: Normal in size without focal abnormality. Adrenals/Urinary Tract: Adrenal glands are within normal limits. Kidneys are well visualized bilaterally without renal calculi. Stable right angiomyolipoma is noted. No follow-up is recommended. Bladder is within normal limits. Stomach/Bowel: Scattered diverticular change of the colon is noted without evidence of diverticulitis. No obstructive changes are seen. The appendix is within normal limits. Small bowel and stomach are unremarkable. Vascular/Lymphatic: Aortic atherosclerosis. No enlarged abdominal or pelvic lymph nodes. Reproductive: Status post hysterectomy. No adnexal masses. Other: No abdominal wall hernia or abnormality. No abdominopelvic ascites. Musculoskeletal: No acute or significant osseous findings. IMPRESSION: Cholelithiasis without complicating factors. Diverticulosis without  diverticulitis. No other focal abnormality is noted. Electronically Signed   By: Inez Catalina M.D.   On: 12/29/2021 00:49   DG Chest Port 1 View  Result Date: 12/29/2021 CLINICAL DATA:  Lightheadedness.  Nausea and vomiting. EXAM: PORTABLE CHEST 1 VIEW COMPARISON:  None Available. FINDINGS: Normal heart size. Aortic atherosclerosis. Bibasilar atelectasis without confluent airspace disease. No pulmonary edema, large pleural effusion or pneumothorax. No acute osseous findings IMPRESSION: Bibasilar atelectasis. Electronically Signed   By: Keith Rake M.D.   On: 12/29/2021 00:25   CT HEAD WO CONTRAST (5MM)  Result Date: 12/28/2021 CLINICAL DATA:  Acute neurologic deficit EXAM: CT HEAD WITHOUT CONTRAST TECHNIQUE: Contiguous axial images were obtained from the base of the skull through the vertex without intravenous contrast. RADIATION DOSE REDUCTION: This exam was performed according to the departmental dose-optimization program which includes automated exposure control, adjustment of the mA and/or kV according to patient size and/or use of iterative reconstruction technique. COMPARISON:  None Available. FINDINGS: Brain: There is no mass, hemorrhage or extra-axial collection. The size and configuration of the ventricles and extra-axial CSF spaces are normal. There is hypoattenuation of the white matter, most commonly indicating chronic small vessel disease. Old left thalamic small vessel infarct. Vascular: Atherosclerotic calcification of the internal carotid arteries at the skull base. No abnormal hyperdensity of the major intracranial arteries or dural venous sinuses. Skull: The visualized skull base, calvarium and extracranial soft tissues are normal. Sinuses/Orbits: No fluid levels or advanced mucosal thickening of the visualized paranasal sinuses. No mastoid or middle ear effusion. The orbits are normal. IMPRESSION: Chronic small vessel disease without acute intracranial abnormality. Electronically Signed    By: Ulyses Jarred M.D.   On: 12/28/2021 21:53               LOS: 0 days   Rudransh Bellanca  [facility]   Pager on [email or web address]. If 7PM-7AM, please contact night-coverage at [email or web address]     12/29/2021, 1:25 PM

## 2021-12-29 NOTE — Assessment & Plan Note (Signed)
Appears resolved. Will get CT abd to eval for intrabdominal pathology as other workup unremarkable Clear liquids Nutritionist eval

## 2021-12-29 NOTE — Evaluation (Signed)
Physical Therapy Evaluation Patient Details Name: [name] MRN: [id number] DOB: 02-13-1941 Today's Date: 12/29/2021  History of Present Illness  [name] is a 81 y.o. female with medical history significant for HTN, DM, OA, GAD, PSVT, valvulopathy (mild to moderate MR, AI, TR and mild AS), history of vasovagal syncope, CKD 3A, independent at baseline, who presents to the ED with sudden onset severe weakness, with inability getting up out of her recliner requiring 2 person assist.   Clinical Impression  Pt admitted with above diagnosis. Pt received supine in bed agreeable to PT. Pt endorsing living with family who provides intermittent support. Pt at baseline indep  with mobility sometimes using SPC in community PRN. Indep with ADL's/IADL's. Pt endorsing low energy levels and still having nausea and feels like she needs to vomit limiting her participation. Pt mod-I transferring from supine to sitting EOB. Initial unsteadiness noted with R/L drifting but self corrects after a few seconds. Pt deferring standing as pt reports worsening of nausea and reports dizziness. Pt unable to elaborate on quality of dizziness (I.e. vertigo, visual changes, light headedness). Pt deferring standing attempts and minimally able to laterally scoot to the L towards HOB. Pt does require minA at LE's to return to supine. All needs in reach. HR: 75 BPM and BP: 144/54 mm Hg and MAP 81. Anticipate based of of PLOF and ease of bed mobility, if medical issues resolve pt will progress quickly in ability to perform transfers and gait. At this time pt unable to justify safe return home especially with limited family support due to work schedules at this time.  Pt currently with functional limitations due to the deficits listed below (see PT Problem List). Pt will benefit from skilled PT to increase their independence and safety with mobility to allow discharge to the venue listed below.     Recommendations for follow up  therapy are one component of a multi-disciplinary discharge planning process, led by the attending physician.  Recommendations may be updated based on patient status, additional functional criteria and insurance authorization.  Follow Up Recommendations Skilled nursing-short term rehab (<3 hours/day) Can patient physically be transported by private vehicle: No    Assistance Recommended at Discharge Intermittent Supervision/Assistance  Patient can return home with the following  A lot of help with walking and/or transfers;A lot of help with bathing/dressing/bathroom;Help with stairs or ramp for entrance;Assist for transportation;Assistance with cooking/housework    Equipment Recommendations Other (comment) (tbd by next venue of care)  Recommendations for Other Services       Functional Status Assessment Patient has had a recent decline in their functional status and demonstrates the ability to make significant improvements in function in a reasonable and predictable amount of time.     Precautions / Restrictions Precautions Precautions: Fall Restrictions Weight Bearing Restrictions: No      Mobility  Bed Mobility Overal bed mobility: Modified Independent             General bed mobility comments: bed features Patient Response: Cooperative  Transfers                   General transfer comment: deferring to stand at this time due to N/V    Ambulation/Gait               General Gait Details: deferred  Stairs            Wheelchair Mobility    Modified Rankin (Stroke Patients Only)  Balance Overall balance assessment: Needs assistance Sitting-balance support: Bilateral upper extremity supported, Feet supported Sitting balance-Leahy Scale: Fair Sitting balance - Comments: Initially unsteady with swaying when sitting up. Self corrects.       Standing balance comment: pt deferring due to dizzines and N/V                              Pertinent Vitals/Pain Pain Assessment Pain Assessment: No/denies pain    Home Living Family/patient expects to be discharged to:: Private residence Living Arrangements: Children;Other relatives (granddaughter) Available Help at Discharge: Family;Available PRN/intermittently Type of Home: House Home Access: Stairs to enter Entrance Stairs-Rails: Right Entrance Stairs-Number of Steps: 1-2   Home Layout: One level Home Equipment: Cane - single point      Prior Function Prior Level of Function : Independent/Modified Independent                     Hand Dominance        Extremity/Trunk Assessment   Upper Extremity Assessment Upper Extremity Assessment: Generalized weakness    Lower Extremity Assessment Lower Extremity Assessment: Generalized weakness    Cervical / Trunk Assessment Cervical / Trunk Assessment: Normal  Communication   Communication: No difficulties  Cognition Arousal/Alertness: Awake/alert Behavior During Therapy: WFL for tasks assessed/performed Overall Cognitive Status: Within Functional Limits for tasks assessed                                          General Comments      Exercises Other Exercises Other Exercises: Role of PT in acute setting, d/c recs   Assessment/Plan    PT Assessment Patient needs continued PT services  PT Problem List Decreased strength;Decreased mobility;Decreased activity tolerance;Decreased balance       PT Treatment Interventions DME instruction;Therapeutic exercise;Gait training;Balance training;Neuromuscular re-education;Stair training;Functional mobility training;Therapeutic activities;Patient/family education    PT Goals (Current goals can be found in the Care Plan section)  Acute Rehab PT Goals Patient Stated Goal: to feel better and go home PT Goal Formulation: With patient Time For Goal Achievement: 01/12/22 Potential to Achieve Goals: Good    Frequency Min 2X/week      Co-evaluation               AM-PAC PT "6 Clicks" Mobility  Outcome Measure Help needed turning from your back to your side while in a flat bed without using bedrails?: None Help needed moving from lying on your back to sitting on the side of a flat bed without using bedrails?: A Little Help needed moving to and from a bed to a chair (including a wheelchair)?: Total Help needed standing up from a chair using your arms (e.g., wheelchair or bedside chair)?: Total Help needed to walk in hospital room?: Total Help needed climbing 3-5 steps with a railing? : Total 6 Click Score: 11    End of Session   Activity Tolerance: Treatment limited secondary to medical complications (Comment) (N/v and dizziness) Patient left: in bed;with bed alarm set Nurse Communication: Mobility status PT Visit Diagnosis: Muscle weakness (generalized) (M62.81);Other abnormalities of gait and mobility (R26.89)    Time: 4696-2952 PT Time Calculation (min) (ACUTE ONLY): 21 min   Charges:   PT Evaluation $PT Eval Moderate Complexity: 1 Mod PT Treatments $Therapeutic Activity: 8-22 mins       Olga Millers  Rocco Pauls, PT, DPT Physical Therapist- [facility]  12/29/2021, 10:13 AM

## 2021-12-30 DIAGNOSIS — I471 Supraventricular tachycardia: Secondary | ICD-10-CM | POA: Diagnosis not present

## 2021-12-30 DIAGNOSIS — M1990 Unspecified osteoarthritis, unspecified site: Secondary | ICD-10-CM | POA: Diagnosis not present

## 2021-12-30 DIAGNOSIS — E7800 Pure hypercholesterolemia, unspecified: Secondary | ICD-10-CM | POA: Diagnosis not present

## 2021-12-30 DIAGNOSIS — J069 Acute upper respiratory infection, unspecified: Secondary | ICD-10-CM | POA: Diagnosis not present

## 2021-12-30 DIAGNOSIS — E782 Mixed hyperlipidemia: Secondary | ICD-10-CM | POA: Diagnosis not present

## 2021-12-30 DIAGNOSIS — R1110 Vomiting, unspecified: Secondary | ICD-10-CM | POA: Diagnosis not present

## 2021-12-30 DIAGNOSIS — Z7984 Long term (current) use of oral hypoglycemic drugs: Secondary | ICD-10-CM | POA: Diagnosis not present

## 2021-12-30 DIAGNOSIS — Z7982 Long term (current) use of aspirin: Secondary | ICD-10-CM | POA: Diagnosis not present

## 2021-12-30 DIAGNOSIS — M6281 Muscle weakness (generalized): Secondary | ICD-10-CM | POA: Diagnosis not present

## 2021-12-30 DIAGNOSIS — I160 Hypertensive urgency: Secondary | ICD-10-CM | POA: Diagnosis not present

## 2021-12-30 DIAGNOSIS — N1832 Chronic kidney disease, stage 3b: Secondary | ICD-10-CM | POA: Diagnosis not present

## 2021-12-30 DIAGNOSIS — R109 Unspecified abdominal pain: Secondary | ICD-10-CM | POA: Diagnosis not present

## 2021-12-30 DIAGNOSIS — Z7401 Bed confinement status: Secondary | ICD-10-CM | POA: Diagnosis not present

## 2021-12-30 DIAGNOSIS — I129 Hypertensive chronic kidney disease with stage 1 through stage 4 chronic kidney disease, or unspecified chronic kidney disease: Secondary | ICD-10-CM | POA: Diagnosis not present

## 2021-12-30 DIAGNOSIS — N1831 Chronic kidney disease, stage 3a: Secondary | ICD-10-CM | POA: Diagnosis not present

## 2021-12-30 DIAGNOSIS — K5900 Constipation, unspecified: Secondary | ICD-10-CM | POA: Diagnosis not present

## 2021-12-30 DIAGNOSIS — D508 Other iron deficiency anemias: Secondary | ICD-10-CM | POA: Diagnosis not present

## 2021-12-30 DIAGNOSIS — I0989 Other specified rheumatic heart diseases: Secondary | ICD-10-CM | POA: Diagnosis not present

## 2021-12-30 DIAGNOSIS — Z79899 Other long term (current) drug therapy: Secondary | ICD-10-CM | POA: Diagnosis not present

## 2021-12-30 DIAGNOSIS — R2689 Other abnormalities of gait and mobility: Secondary | ICD-10-CM | POA: Diagnosis not present

## 2021-12-30 DIAGNOSIS — Z20822 Contact with and (suspected) exposure to covid-19: Secondary | ICD-10-CM | POA: Diagnosis not present

## 2021-12-30 DIAGNOSIS — K219 Gastro-esophageal reflux disease without esophagitis: Secondary | ICD-10-CM | POA: Diagnosis not present

## 2021-12-30 DIAGNOSIS — M0579 Rheumatoid arthritis with rheumatoid factor of multiple sites without organ or systems involvement: Secondary | ICD-10-CM | POA: Diagnosis not present

## 2021-12-30 DIAGNOSIS — E1165 Type 2 diabetes mellitus with hyperglycemia: Secondary | ICD-10-CM | POA: Diagnosis not present

## 2021-12-30 DIAGNOSIS — R531 Weakness: Secondary | ICD-10-CM | POA: Diagnosis not present

## 2021-12-30 DIAGNOSIS — E559 Vitamin D deficiency, unspecified: Secondary | ICD-10-CM | POA: Diagnosis not present

## 2021-12-30 DIAGNOSIS — R55 Syncope and collapse: Secondary | ICD-10-CM | POA: Diagnosis not present

## 2021-12-30 DIAGNOSIS — M069 Rheumatoid arthritis, unspecified: Secondary | ICD-10-CM | POA: Diagnosis not present

## 2021-12-30 DIAGNOSIS — E1122 Type 2 diabetes mellitus with diabetic chronic kidney disease: Secondary | ICD-10-CM | POA: Diagnosis not present

## 2021-12-30 LAB — GLUCOSE, CAPILLARY
Glucose-Capillary: 123 mg/dL — ABNORMAL HIGH (ref 70–99)
Glucose-Capillary: 200 mg/dL — ABNORMAL HIGH (ref 70–99)

## 2021-12-30 MED ORDER — ENOXAPARIN SODIUM 40 MG/0.4ML IJ SOSY: 40.0000 mg | PREFILLED_SYRINGE | Freq: Every day | INTRAMUSCULAR | Status: DC

## 2021-12-30 MED ORDER — SENNOSIDES-DOCUSATE SODIUM 8.6-50 MG PO TABS: 1.0000 | ORAL_TABLET | Freq: Every evening | ORAL | Status: DC | PRN

## 2021-12-30 MED ORDER — POLYETHYLENE GLYCOL 3350 17 G PO PACK
17.0000 g | PACK | Freq: Every day | ORAL | Status: DC
  Administered 2021-12-30: 17 g via ORAL
  Filled 2021-12-30: qty 1

## 2021-12-30 NOTE — Progress Notes (Signed)
Physical Therapy Treatment Patient Details Name: [name] MRN: [id number] DOB: [birth year] Today's Date: 12/30/2021   History of Present Illness [name] is a 81 y.o. female with medical history significant for HTN, DM, OA, GAD, PSVT, valvulopathy (mild to moderate MR, AI, TR and mild AS), history of vasovagal syncope, CKD 3A, independent at baseline, who presents to the ED with sudden onset severe weakness, with inability getting up out of her recliner requiring 2 person assist.    PT Comments    Pt received upright in bed agreeable to PT services. Pt still endorsing mild dizziness but able to progress in mobility. Mod-I to EoB and standing to RW with minguard and poor hand placement despite VC's. Able to progress to gait ~50' but very slow and cautious gait noted with step to pattern due to reports of mild dizziness still. Pt required mod VC's for RW proximity throughout to improve stability and walking tolerance. Pt returning to sitting in recliner with all needs in reach. Pt progressing in mobility but still demonstrates LE weakness and limited safety awareness thus PT still to rec STR at discharge.   Recommendations for follow up therapy are one component of a multi-disciplinary discharge planning process, led by the attending physician.  Recommendations may be updated based on patient status, additional functional criteria and insurance authorization.  Follow Up Recommendations  Skilled nursing-short term rehab (<3 hours/day) Can patient physically be transported by private vehicle: Yes   Assistance Recommended at Discharge Intermittent Supervision/Assistance  Patient can return home with the following A little help with walking and/or transfers;Assistance with cooking/housework;Assist for transportation;Help with stairs or ramp for entrance   Equipment Recommendations  Other (comment) (tbd by next venue of care)    Recommendations for Other Services       Precautions /  Restrictions Precautions Precautions: Fall Restrictions Weight Bearing Restrictions: No     Mobility  Bed Mobility Overal bed mobility: Modified Independent             General bed mobility comments: bed features Patient Response: Cooperative  Transfers Overall transfer level: Needs assistance Equipment used: Rolling walker (2 wheels) Transfers: Sit to/from Stand Sit to Stand: Min guard           General transfer comment: VC's for hand placement    Ambulation/Gait Ambulation/Gait assistance: Supervision Gait Distance (Feet): 50 Feet Assistive device: Rolling walker (2 wheels) Gait Pattern/deviations: Step-through pattern, Decreased step length - right, Decreased step length - left       General Gait Details: requiring mod VC's for use of RW to improve stability and decrease falls risk   Stairs             Wheelchair Mobility    Modified Rankin (Stroke Patients Only)       Balance Overall balance assessment: Needs assistance Sitting-balance support: Bilateral upper extremity supported, Feet supported Sitting balance-Leahy Scale: Fair Sitting balance - Comments: improved sitting tolerance without any sway.   Standing balance support: During functional activity Standing balance-Leahy Scale: Fair Standing balance comment: gentle use of UE's on RW                            Cognition Arousal/Alertness: Awake/alert Behavior During Therapy: WFL for tasks assessed/performed Overall Cognitive Status: Within Functional Limits for tasks assessed  Exercises General Exercises - Lower Extremity Long Arc Quad: AROM, Strengthening, Both, 10 reps, Seated Straight Leg Raises: AROM, Strengthening, Both, 10 reps, Supine Hip Flexion/Marching: AROM, Strengthening, Seated, Both, 10 reps    General Comments        Pertinent Vitals/Pain Pain Assessment Pain Assessment: No/denies pain     Home Living                          Prior Function            PT Goals (current goals can now be found in the care plan section) Acute Rehab PT Goals Patient Stated Goal: to feel better and go home PT Goal Formulation: With patient Time For Goal Achievement: 01/12/22 Potential to Achieve Goals: Good Progress towards PT goals: Progressing toward goals    Frequency    Min 2X/week      PT Plan Current plan remains appropriate    Co-evaluation              AM-PAC PT "6 Clicks" Mobility   Outcome Measure  Help needed turning from your back to your side while in a flat bed without using bedrails?: None Help needed moving from lying on your back to sitting on the side of a flat bed without using bedrails?: A Little Help needed moving to and from a bed to a chair (including a wheelchair)?: A Little Help needed standing up from a chair using your arms (e.g., wheelchair or bedside chair)?: A Little Help needed to walk in hospital room?: A Little Help needed climbing 3-5 steps with a railing? : A Lot 6 Click Score: 18    End of Session Equipment Utilized During Treatment: Gait belt Activity Tolerance: Patient tolerated treatment well Patient left: in chair;with call bell/phone within reach;with chair alarm set Nurse Communication: Mobility status PT Visit Diagnosis: Muscle weakness (generalized) (M62.81);Other abnormalities of gait and mobility (R26.89)     Time: 1027-2536 PT Time Calculation (min) (ACUTE ONLY): 26 min  Charges:  $Therapeutic Exercise: 23-37 mins                     [name] IV, PT, DPT Physical Therapist- [facility]  12/30/2021, 11:11 AM

## 2021-12-30 NOTE — Progress Notes (Signed)
Report called to [place], RN @ [employer].

## 2021-12-30 NOTE — Plan of Care (Signed)
  Problem: Nutritional: Goal: Maintenance of adequate nutrition will improve Outcome: Progressing   Problem: Skin Integrity: Goal: Risk for impaired skin integrity will decrease Outcome: Progressing   Problem: Nutrition: Goal: Adequate nutrition will be maintained Outcome: Progressing   Problem: Pain Managment: Goal: General experience of comfort will improve Outcome: Progressing

## 2021-12-30 NOTE — TOC Progression Note (Signed)
Transition of Care [facility]) - Progression Note    Patient Details  Name: [name] MRN: [id number] Date of Birth: 1940-07-19  [facility] CM/SW [place], RN Phone Number: 12/30/2021, 11:18 AM  Clinical Narrative:    Ins approved, spoke to [place] the patients son Notified him of the room number 22 at [employer]   Expected Discharge Plan: [place]    Expected Discharge Plan and Services Expected Discharge Plan: [place]         Expected Discharge Date: 12/30/21                                     Social Determinants of Health (SDOH) Interventions    Readmission Risk Interventions     No data to display

## 2021-12-30 NOTE — Discharge Summary (Signed)
Physician Discharge Summary   Patient: [name] MRN: [id number] DOB: Apr 18, 1941  Admit date:     12/28/2021  Discharge date: 12/30/21  Discharge Physician: Jennye Boroughs   PCP: Jonetta Osgood, NP   Recommendations at discharge:   Follow-up with oxygen at the nursing home within 3 days of discharge. Follow-up with PCP in 2 weeks  Discharge Diagnoses: Principal Problem:   Generalized weakness Active Problems:   Hypertensive urgency   Uncontrolled type 2 diabetes mellitus with hyperglycemia, without long-term current use of insulin (HCC)   Vomiting   PSVT (paroxysmal supraventricular tachycardia) (HCC)   Rheumatoid arthritis involving multiple sites with positive rheumatoid factor (HCC)   Valvular heart disease   Stage 3a chronic kidney disease (HCC)   URI (upper respiratory infection)  Resolved Problems:   * No resolved hospital problems. [facility] Course:  [name] is a 81 y.o. female with medical history significant for HTN, DM, OA, GAD, PSVT, valvulopathy (mild to moderate MR, AI, TR and mild AS), history of vasovagal syncope, CKD 3A, independent at baseline, who presented to the hospital because of generalized weakness, headache and dizziness/lightheadedness.  She said she felt so weak she could not even get out of her recliner.  She reported a cough that has been going on for about 2 weeks.  Cough is productive of clear phlegm.  She vomited about a day prior to admission from coughing.  Her blood pressure was 244/80 in the emergency room.  She said she is medically adherent with her medications.   She was admitted to the hospital for generalized weakness and hypertensive urgency.  She probably has upper respiratory tract infection as well.  MRI brain did not show any acute stroke or abnormality.  She was treated with antihypertensives and BP has improved.  The importance of medical adherence was emphasized.  She was evaluated by PT and OT who recommended  further rehabilitation at the skilled nursing facility.  Her condition has improved and she is deemed stable for discharge to home today.        Consultants: None Procedures performed: None  Disposition: Skilled nursing facility Diet recommendation:  Discharge Diet Orders (From admission, onward)     Start     Ordered   12/30/21 0000  Diet - low sodium heart healthy        12/30/21 1116   12/30/21 0000  Diet Carb Modified        12/30/21 1116           Cardiac and Carb modified diet DISCHARGE MEDICATION: Allergies as of 12/30/2021   No Known Allergies      Medication List     TAKE these medications    acetaminophen 500 MG tablet Commonly known as: TYLENOL Take 500-1,000 mg by mouth every 6 (six) hours as needed.   Alcohol Prep Pads   amLODipine 5 MG tablet Commonly known as: NORVASC TAKE 1 TABLET (5 mg)  BY MOUTH IN  THE EVENING   aspirin EC 81 MG tablet Take 1 tablet (81 mg total) by mouth daily.   atorvastatin 10 MG tablet Commonly known as: LIPITOR TAKE 1 TABLET BY MOUTH AT  BEDTIME FOR HIGH  CHOLESTEROL   Combigan 0.2-0.5 % ophthalmic solution Generic drug: brimonidine-timolol Apply 1 drop to eye 2 (two) times daily. Left eye   folic acid 1 MG tablet Commonly known as: FOLVITE TAKE 2 TABLETS BY MOUTH  DAILY   glimepiride 4 MG tablet Commonly known as:  AMARYL TAKE ONE-HALF TABLET BY  MOUTH WITH BREAKFAST AND  ONE-HALF TABLET BY MOUTH  WITH DINNER   hydrochlorothiazide 25 MG tablet Commonly known as: HYDRODIURIL Take 25 mg by mouth daily.   IRON 27 PO Take 27 mg by mouth daily.   losartan 100 MG tablet Commonly known as: COZAAR Take 1 tablet (100 mg total) by mouth daily.   methotrexate 2.5 MG tablet Take 15 mg by mouth once a week.   pantoprazole 40 MG tablet Commonly known as: PROTONIX Take 1 tablet (40 mg total) by mouth daily. TAKE 1 TABLET DAILY FOR    REFLUX   Vitamin D (Ergocalciferol) 1.25 MG (50000 UNIT) Caps capsule Commonly  known as: DRISDOL Take 1 capsule (50,000 Units total) by mouth once a week.        Contact information for after-discharge care     [facility] .   Service: Skilled Nursing Contact information: [place] [phone]                    Discharge Exam: Danley Danker Weights   12/28/21 1856 12/29/21 0500 12/30/21 0500  Weight: 61.7 kg 59.2 kg 62.4 kg   GEN: NAD SKIN: Warm and dry EYES: No pallor or icterus ENT: MMM CV: RRR PULM: CTA B ABD: soft, ND, NT, +BS CNS: AAO x 3, non focal EXT: No edema or tenderness   Condition at discharge: good  The results of significant diagnostics from this hospitalization (including imaging, microbiology, ancillary and laboratory) are listed below for reference.   Imaging Studies: ECHOCARDIOGRAM COMPLETE  Result Date: 12/29/2021    ECHOCARDIOGRAM REPORT   Patient Name:   [name] Date of Exam: 12/29/2021 Medical Rec #:  [id number]        Height:       62.0 in Accession #:    [id number]       Weight:       130.5 lb Date of Birth:  June 30, 1940         BSA:          1.595 m Patient Age:    81 years         BP:           146/66 mmHg Patient Gender: F                HR:           69 bpm. Exam Location:  [facility] Procedure: 2D Echo, Cardiac Doppler and Color Doppler Indications:     Aortic valve disorder I35.9  History:         Patient has prior history of Echocardiogram examinations, most                  recent 03/04/2020. Signs/Symptoms:Murmur; Risk                  Factors:Hypertension and Diabetes.  Sonographer:     Sherrie Sport Referring Phys:  [id number] Athena Masse Diagnosing Phys: Ida Rogue MD  Sonographer Comments: Suboptimal apical window and no subcostal window. IMPRESSIONS  1. Left ventricular ejection fraction, by estimation, is 60 to 65%. The left ventricle has normal function. The left  ventricle has no regional wall motion abnormalities. Left ventricular diastolic parameters are consistent with Grade I diastolic dysfunction (impaired relaxation).  2. Right ventricular systolic function is normal. The  right ventricular size is normal. There is mildly elevated pulmonary artery systolic pressure. The estimated right ventricular systolic pressure is 95.2 mmHg.  3. Left atrial size was moderately dilated.  4. The mitral valve is normal in structure. Mild mitral valve regurgitation. No evidence of mitral stenosis. Moderate mitral annular calcification.  5. The aortic valve is normal in structure. Aortic valve regurgitation is mild. No aortic stenosis is present.  6. The inferior vena cava is normal in size with greater than 50% respiratory variability, suggesting right atrial pressure of 3 mmHg. FINDINGS  Left Ventricle: Left ventricular ejection fraction, by estimation, is 60 to 65%. The left ventricle has normal function. The left ventricle has no regional wall motion abnormalities. The left ventricular internal cavity size was normal in size. There is  no left ventricular hypertrophy. Left ventricular diastolic parameters are consistent with Grade I diastolic dysfunction (impaired relaxation). Right Ventricle: The right ventricular size is normal. No increase in right ventricular wall thickness. Right ventricular systolic function is normal. There is mildly elevated pulmonary artery systolic pressure. The tricuspid regurgitant velocity is 3.08  m/s, and with an assumed right atrial pressure of 5 mmHg, the estimated right ventricular systolic pressure is 84.1 mmHg. Left Atrium: Left atrial size was moderately dilated. Right Atrium: Right atrial size was normal in size. Pericardium: There is no evidence of pericardial effusion. Mitral Valve: The mitral valve is normal in structure. Moderate mitral annular calcification. Mild mitral valve regurgitation. No evidence of mitral valve stenosis. MV peak  gradient, 12.2 mmHg. The mean mitral valve gradient is 4.0 mmHg. Tricuspid Valve: The tricuspid valve is normal in structure. Tricuspid valve regurgitation is mild . No evidence of tricuspid stenosis. Aortic Valve: The aortic valve is normal in structure. Aortic valve regurgitation is mild. No aortic stenosis is present. Aortic valve mean gradient measures 10.3 mmHg. Aortic valve peak gradient measures 18.8 mmHg. Aortic valve area, by VTI measures 2.68 cm. Pulmonic Valve: The pulmonic valve was normal in structure. Pulmonic valve regurgitation is not visualized. No evidence of pulmonic stenosis. Aorta: The aortic root is normal in size and structure. Venous: The inferior vena cava is normal in size with greater than 50% respiratory variability, suggesting right atrial pressure of 3 mmHg. IAS/Shunts: No atrial level shunt detected by color flow Doppler.  LEFT VENTRICLE PLAX 2D LVIDd:         3.10 cm   Diastology LVIDs:         2.10 cm   LV e' medial:    3.92 cm/s LV PW:         1.40 cm   LV E/e' medial:  31.6 LV IVS:        1.00 cm   LV e' lateral:   4.90 cm/s LVOT diam:     2.00 cm   LV E/e' lateral: 25.3 LV SV:         122 LV SV Index:   76 LVOT Area:     3.14 cm  RIGHT VENTRICLE RV Basal diam:  3.50 cm RV S prime:     17.40 cm/s TAPSE (M-mode): 2.0 cm LEFT ATRIUM             Index        RIGHT ATRIUM           Index LA diam:        2.70 cm 1.69 cm/m   RA Area:     10.60 cm LA Vol (A2C):   98.7 ml 61.90  ml/m  RA Volume:   20.30 ml  12.73 ml/m LA Vol (A4C):   80.5 ml 50.49 ml/m LA Biplane Vol: 93.4 ml 58.58 ml/m  AORTIC VALVE AV Area (Vmax):    4.20 cm AV Area (Vmean):   2.53 cm AV Area (VTI):     2.68 cm AV Vmax:           216.67 cm/s AV Vmean:          147.667 cm/s AV VTI:            0.455 m AV Peak Grad:      18.8 mmHg AV Mean Grad:      10.3 mmHg LVOT Vmax:         290.00 cm/s LVOT Vmean:        119.000 cm/s LVOT VTI:          0.388 m LVOT/AV VTI ratio: 0.85  AORTA Ao Root diam: 2.80 cm MITRAL VALVE                 TRICUSPID VALVE MV Area (PHT): 1.98 cm     TR Peak grad:   37.9 mmHg MV Area VTI:   2.09 cm     TR Vmax:        308.00 cm/s MV Peak grad:  12.2 mmHg MV Mean grad:  4.0 mmHg     SHUNTS MV Vmax:       1.75 m/s     Systemic VTI:  0.39 m MV Vmean:      87.7 cm/s    Systemic Diam: 2.00 cm MV Decel Time: 384 msec MV E velocity: 124.00 cm/s MV A velocity: 156.00 cm/s MV E/A ratio:  0.79 Ida Rogue MD Electronically signed by Ida Rogue MD Signature Date/Time: 12/29/2021/3:43:11 PM    Final    MR BRAIN WO CONTRAST  Result Date: 12/29/2021 CLINICAL DATA:  Dizziness. EXAM: MRI HEAD WITHOUT CONTRAST TECHNIQUE: Multiplanar, multiecho pulse sequences of the brain and surrounding structures were obtained without intravenous contrast. COMPARISON:  CT head 12/28/2021 FINDINGS: Brain: Ventricle size and cerebral volume normal for age. Negative for acute infarct. Mild to moderate chronic microvascular ischemic change in the white matter. Chronic infarct left thalamus and body of corpus callosum. Brainstem and cerebellum intact. Negative for hemorrhage or mass. Vascular: Normal arterial flow voids at the skull base. Skull and upper cervical spine: Negative Sinuses/Orbits: Paranasal sinus clear. Bilateral cataract extraction Other: None IMPRESSION: No acute abnormality and no cause for dizziness Mild to moderate chronic microvascular ischemic change. Electronically Signed   By: Franchot Gallo M.D.   On: 12/29/2021 13:00   CT ABDOMEN PELVIS WO CONTRAST  Result Date: 12/29/2021 CLINICAL DATA:  Nausea and vomiting EXAM: CT ABDOMEN AND PELVIS WITHOUT CONTRAST TECHNIQUE: Multidetector CT imaging of the abdomen and pelvis was performed following the standard protocol without IV contrast. RADIATION DOSE REDUCTION: This exam was performed according to the departmental dose-optimization program which includes automated exposure control, adjustment of the mA and/or kV according to patient size and/or use of iterative  reconstruction technique. COMPARISON:  12/05/2005 FINDINGS: Lower chest: No acute abnormality.  Mild scarring is noted. Hepatobiliary: Liver is within normal limits. Gallbladder is well distended with multiple dependent gallstones. No biliary ductal abnormality is noted. Pancreas: Unremarkable. No pancreatic ductal dilatation or surrounding inflammatory changes. Spleen: Normal in size without focal abnormality. Adrenals/Urinary Tract: Adrenal glands are within normal limits. Kidneys are well visualized bilaterally without renal calculi. Stable right angiomyolipoma is noted. No follow-up is  recommended. Bladder is within normal limits. Stomach/Bowel: Scattered diverticular change of the colon is noted without evidence of diverticulitis. No obstructive changes are seen. The appendix is within normal limits. Small bowel and stomach are unremarkable. Vascular/Lymphatic: Aortic atherosclerosis. No enlarged abdominal or pelvic lymph nodes. Reproductive: Status post hysterectomy. No adnexal masses. Other: No abdominal wall hernia or abnormality. No abdominopelvic ascites. Musculoskeletal: No acute or significant osseous findings. IMPRESSION: Cholelithiasis without complicating factors. Diverticulosis without diverticulitis. No other focal abnormality is noted. Electronically Signed   By: Inez Catalina M.D.   On: 12/29/2021 00:49   DG Chest Port 1 View  Result Date: 12/29/2021 CLINICAL DATA:  Lightheadedness.  Nausea and vomiting. EXAM: PORTABLE CHEST 1 VIEW COMPARISON:  None Available. FINDINGS: Normal heart size. Aortic atherosclerosis. Bibasilar atelectasis without confluent airspace disease. No pulmonary edema, large pleural effusion or pneumothorax. No acute osseous findings IMPRESSION: Bibasilar atelectasis. Electronically Signed   By: Keith Rake M.D.   On: 12/29/2021 00:25   CT HEAD WO CONTRAST (5MM)  Result Date: 12/28/2021 CLINICAL DATA:  Acute neurologic deficit EXAM: CT HEAD WITHOUT CONTRAST TECHNIQUE:  Contiguous axial images were obtained from the base of the skull through the vertex without intravenous contrast. RADIATION DOSE REDUCTION: This exam was performed according to the departmental dose-optimization program which includes automated exposure control, adjustment of the mA and/or kV according to patient size and/or use of iterative reconstruction technique. COMPARISON:  None Available. FINDINGS: Brain: There is no mass, hemorrhage or extra-axial collection. The size and configuration of the ventricles and extra-axial CSF spaces are normal. There is hypoattenuation of the white matter, most commonly indicating chronic small vessel disease. Old left thalamic small vessel infarct. Vascular: Atherosclerotic calcification of the internal carotid arteries at the skull base. No abnormal hyperdensity of the major intracranial arteries or dural venous sinuses. Skull: The visualized skull base, calvarium and extracranial soft tissues are normal. Sinuses/Orbits: No fluid levels or advanced mucosal thickening of the visualized paranasal sinuses. No mastoid or middle ear effusion. The orbits are normal. IMPRESSION: Chronic small vessel disease without acute intracranial abnormality. Electronically Signed   By: Ulyses Jarred M.D.   On: 12/28/2021 21:53    Microbiology: Results for orders placed or performed during the hospital encounter of 12/28/21  Resp Panel by RT-PCR (Flu A&B, Covid) Anterior Nasal Swab     Status: None   Collection Time: 12/28/21  7:05 PM   Specimen: Anterior Nasal Swab  Result Value Ref Range Status   SARS Coronavirus 2 by RT PCR NEGATIVE NEGATIVE Final    Comment: (NOTE) SARS-CoV-2 target nucleic acids are NOT DETECTED.  The SARS-CoV-2 RNA is generally detectable in upper respiratory specimens during the acute phase of infection. The lowest concentration of SARS-CoV-2 viral copies this assay can detect is 138 copies/mL. A negative result does not preclude SARS-Cov-2 infection and  should not be used as the sole basis for treatment or other patient management decisions. A negative result may occur with  improper specimen collection/handling, submission of specimen other than nasopharyngeal swab, presence of viral mutation(s) within the areas targeted by this assay, and inadequate number of viral copies(<138 copies/mL). A negative result must be combined with clinical observations, patient history, and epidemiological information. The expected result is Negative.  Fact Sheet for Patients:  EntrepreneurPulse.com.au  Fact Sheet for Healthcare Providers:  IncredibleEmployment.be  This test is no t yet approved or cleared by the [place] FDA and  has been authorized for detection and/or diagnosis of SARS-CoV-2 by FDA  under an Emergency Use Authorization (EUA). This EUA will remain  in effect (meaning this test can be used) for the duration of the COVID-19 declaration under Section 564(b)(1) of the Act, 21 U.S.C.section 360bbb-3(b)(1), unless the authorization is terminated  or revoked sooner.       Influenza A by PCR NEGATIVE NEGATIVE Final   Influenza B by PCR NEGATIVE NEGATIVE Final    Comment: (NOTE) The Xpert Xpress SARS-CoV-2/FLU/RSV plus assay is intended as an aid in the diagnosis of influenza from Nasopharyngeal swab specimens and should not be used as a sole basis for treatment. Nasal washings and aspirates are unacceptable for Xpert Xpress SARS-CoV-2/FLU/RSV testing.  Fact Sheet for Patients: EntrepreneurPulse.com.au  Fact Sheet for Healthcare Providers: IncredibleEmployment.be  This test is not yet approved or cleared by the [place] FDA and has been authorized for detection and/or diagnosis of SARS-CoV-2 by FDA under an Emergency Use Authorization (EUA). This EUA will remain in effect (meaning this test can be used) for the duration of the COVID-19 declaration  under Section 564(b)(1) of the Act, 21 U.S.C. section 360bbb-3(b)(1), unless the authorization is terminated or revoked.  Performed at [facility], [address] [place], [place] [address]     Labs: CBC: Recent Labs  Lab 12/28/21 1905 12/29/21 0010  WBC 9.0 7.8  NEUTROABS 6.9  --   HGB 12.3 12.9  HCT 38.9 40.6  MCV 84.2 83.7  PLT 207 426   Basic Metabolic Panel: Recent Labs  Lab 12/28/21 1905 12/29/21 0010  NA 139  --   K 3.6  --   CL 102  --   CO2 24  --   GLUCOSE 185*  --   BUN 25*  --   CREATININE 1.28* 1.17*  CALCIUM 9.3  --    Liver Function Tests: Recent Labs  Lab 12/28/21 1905  AST 22  ALT 14  ALKPHOS 89  BILITOT 0.8  PROT 7.7  ALBUMIN 4.1   CBG: Recent Labs  Lab 12/29/21 0750 12/29/21 1151 12/29/21 1744 12/29/21 2112 12/30/21 0740  GLUCAP 139* 146* 197* 151* 123*    Discharge time spent: greater than 30 minutes.  Signed: Jennye Boroughs, MD [facility] 12/30/2021

## 2021-12-30 NOTE — Plan of Care (Signed)

## 2021-12-30 NOTE — TOC Progression Note (Signed)
Transition of Care [facility]) - Progression Note    Patient Details  Name: [name] MRN: [id number] Date of Birth: 1941/02/02  Transition of Care [facility]) CM/SW [place], RN Phone Number: 12/30/2021, 11:27 AM  Clinical Narrative:     EMS called to transport   Expected Discharge Plan: [place]    Expected Discharge Plan and Services Expected Discharge Plan: [place]         Expected Discharge Date: 12/30/21                                     Social Determinants of Health (SDOH) Interventions    Readmission Risk Interventions     No data to display

## 2021-12-30 NOTE — TOC Progression Note (Signed)
Transition of Care [facility]) - Progression Note    Patient Details  Name: [name] MRN: [id number] Date of Birth: 09-16-1940  Transition of Care [facility]) CM/SW [place], RN Phone Number: 12/30/2021, 11:09 AM  Clinical Narrative:      Ins auth approved to go to [employer] today      Expected Discharge Plan and Services                                                 Social Determinants of Health (SDOH) Interventions    Readmission Risk Interventions     No data to display

## 2021-12-30 NOTE — TOC Progression Note (Signed)
Transition of Care [facility]) - Progression Note    Patient Details  Name: [name] MRN: [id number] Date of Birth: 01-17-1941  Transition of Care [facility]) CM/SW [place], RN Phone Number: 12/30/2021, 8:49 AM  Clinical Narrative:     Spoke to the patient and her son Lennette Bihari and reviewed the bed offers, They chose [facility], I notified Magda Paganini at [facility] I started the [employer] process and it is pending ref number [id number]       Expected Discharge Plan and Services                                                 Social Determinants of Health (SDOH) Interventions    Readmission Risk Interventions     No data to display

## 2022-01-02 DIAGNOSIS — N1832 Chronic kidney disease, stage 3b: Secondary | ICD-10-CM | POA: Diagnosis not present

## 2022-01-02 DIAGNOSIS — D508 Other iron deficiency anemias: Secondary | ICD-10-CM | POA: Diagnosis not present

## 2022-01-02 DIAGNOSIS — E782 Mixed hyperlipidemia: Secondary | ICD-10-CM | POA: Diagnosis not present

## 2022-01-02 DIAGNOSIS — I471 Supraventricular tachycardia: Secondary | ICD-10-CM | POA: Diagnosis not present

## 2022-01-02 DIAGNOSIS — M6281 Muscle weakness (generalized): Secondary | ICD-10-CM | POA: Diagnosis not present

## 2022-01-02 DIAGNOSIS — I160 Hypertensive urgency: Secondary | ICD-10-CM | POA: Diagnosis not present

## 2022-01-02 DIAGNOSIS — I0989 Other specified rheumatic heart diseases: Secondary | ICD-10-CM | POA: Diagnosis not present

## 2022-01-02 DIAGNOSIS — M069 Rheumatoid arthritis, unspecified: Secondary | ICD-10-CM | POA: Diagnosis not present

## 2022-01-02 DIAGNOSIS — K219 Gastro-esophageal reflux disease without esophagitis: Secondary | ICD-10-CM | POA: Diagnosis not present

## 2022-01-02 DIAGNOSIS — E1122 Type 2 diabetes mellitus with diabetic chronic kidney disease: Secondary | ICD-10-CM | POA: Diagnosis not present

## 2022-01-03 DIAGNOSIS — E782 Mixed hyperlipidemia: Secondary | ICD-10-CM | POA: Diagnosis not present

## 2022-01-03 DIAGNOSIS — K219 Gastro-esophageal reflux disease without esophagitis: Secondary | ICD-10-CM | POA: Diagnosis not present

## 2022-01-03 DIAGNOSIS — I160 Hypertensive urgency: Secondary | ICD-10-CM | POA: Diagnosis not present

## 2022-01-03 DIAGNOSIS — D508 Other iron deficiency anemias: Secondary | ICD-10-CM | POA: Diagnosis not present

## 2022-01-03 DIAGNOSIS — N1832 Chronic kidney disease, stage 3b: Secondary | ICD-10-CM | POA: Diagnosis not present

## 2022-01-03 DIAGNOSIS — M069 Rheumatoid arthritis, unspecified: Secondary | ICD-10-CM | POA: Diagnosis not present

## 2022-01-03 DIAGNOSIS — I471 Supraventricular tachycardia: Secondary | ICD-10-CM | POA: Diagnosis not present

## 2022-01-03 DIAGNOSIS — I0989 Other specified rheumatic heart diseases: Secondary | ICD-10-CM | POA: Diagnosis not present

## 2022-01-03 DIAGNOSIS — M6281 Muscle weakness (generalized): Secondary | ICD-10-CM | POA: Diagnosis not present

## 2022-01-03 DIAGNOSIS — E1122 Type 2 diabetes mellitus with diabetic chronic kidney disease: Secondary | ICD-10-CM | POA: Diagnosis not present

## 2022-01-05 ENCOUNTER — Telehealth: Payer: Self-pay

## 2022-01-06 ENCOUNTER — Telehealth: Payer: Self-pay

## 2022-01-06 DIAGNOSIS — N1832 Chronic kidney disease, stage 3b: Secondary | ICD-10-CM | POA: Diagnosis not present

## 2022-01-06 DIAGNOSIS — I471 Supraventricular tachycardia: Secondary | ICD-10-CM | POA: Diagnosis not present

## 2022-01-06 DIAGNOSIS — E1122 Type 2 diabetes mellitus with diabetic chronic kidney disease: Secondary | ICD-10-CM | POA: Diagnosis not present

## 2022-01-06 DIAGNOSIS — K219 Gastro-esophageal reflux disease without esophagitis: Secondary | ICD-10-CM | POA: Diagnosis not present

## 2022-01-06 DIAGNOSIS — E782 Mixed hyperlipidemia: Secondary | ICD-10-CM | POA: Diagnosis not present

## 2022-01-06 DIAGNOSIS — M6281 Muscle weakness (generalized): Secondary | ICD-10-CM | POA: Diagnosis not present

## 2022-01-06 DIAGNOSIS — M069 Rheumatoid arthritis, unspecified: Secondary | ICD-10-CM | POA: Diagnosis not present

## 2022-01-06 DIAGNOSIS — I160 Hypertensive urgency: Secondary | ICD-10-CM | POA: Diagnosis not present

## 2022-01-06 DIAGNOSIS — D508 Other iron deficiency anemias: Secondary | ICD-10-CM | POA: Diagnosis not present

## 2022-01-06 DIAGNOSIS — I0989 Other specified rheumatic heart diseases: Secondary | ICD-10-CM | POA: Diagnosis not present

## 2022-01-06 NOTE — Telephone Encounter (Signed)
[name] [phone]) with [facility] called requesting verbal orders for Nursing, PT, OT and a Aide.  Pt will be getting discharged from [facility] on Saturday.  gave approval for verbal orders to be done.

## 2022-01-06 NOTE — Telephone Encounter (Signed)
error 

## 2022-01-07 ENCOUNTER — Encounter: Payer: Self-pay | Admitting: Physician Assistant

## 2022-01-07 ENCOUNTER — Emergency Department
Admission: EM | Admit: 2022-01-07 | Discharge: 2022-01-07 | Disposition: A | Discharge status: Home / Self Care | Payer: Medicare Other | Attending: Emergency Medicine | Admitting: Emergency Medicine

## 2022-01-07 ENCOUNTER — Emergency Department: Payer: Medicare Other

## 2022-01-07 ENCOUNTER — Other Ambulatory Visit: Payer: Self-pay

## 2022-01-07 DIAGNOSIS — K5900 Constipation, unspecified: Secondary | ICD-10-CM | POA: Diagnosis not present

## 2022-01-07 DIAGNOSIS — R109 Unspecified abdominal pain: Secondary | ICD-10-CM | POA: Diagnosis not present

## 2022-01-07 LAB — COMPREHENSIVE METABOLIC PANEL
ALT: 20 U/L (ref 0–44)
AST: 23 U/L (ref 15–41)
Albumin: 3.8 g/dL (ref 3.5–5.0)
Alkaline Phosphatase: 79 U/L (ref 38–126)
Anion gap: 6 (ref 5–15)
BUN: 28 mg/dL — ABNORMAL HIGH (ref 8–23)
CO2: 31 mmol/L (ref 22–32)
Calcium: 9.4 mg/dL (ref 8.9–10.3)
Chloride: 102 mmol/L (ref 98–111)
Creatinine, Ser: 1.05 mg/dL — ABNORMAL HIGH (ref 0.44–1.00)
GFR, Estimated: 53 mL/min — ABNORMAL LOW (ref >60)
Glucose, Bld: 93 mg/dL (ref 70–99)
Potassium: 4 mmol/L (ref 3.5–5.1)
Sodium: 139 mmol/L (ref 135–145)
Total Bilirubin: 0.6 mg/dL (ref 0.3–1.2)
Total Protein: 7.8 g/dL (ref 6.5–8.1)

## 2022-01-07 LAB — URINALYSIS, ROUTINE W REFLEX MICROSCOPIC
Bilirubin Urine: NEGATIVE
Glucose, UA: NEGATIVE mg/dL
Hgb urine dipstick: NEGATIVE
Ketones, ur: NEGATIVE mg/dL
Nitrite: NEGATIVE
Protein, ur: 100 mg/dL — AB
Specific Gravity, Urine: 1.015 (ref 1.005–1.030)
pH: 6 (ref 5.0–8.0)

## 2022-01-07 LAB — CBC WITH DIFFERENTIAL/PLATELET
Abs Immature Granulocytes: 0.02 10*3/uL (ref 0.00–0.07)
Basophils Absolute: 0 10*3/uL (ref 0.0–0.1)
Basophils Relative: 0 %
Eosinophils Absolute: 0.3 10*3/uL (ref 0.0–0.5)
Eosinophils Relative: 5 %
HCT: 37.9 % (ref 36.0–46.0)
Hemoglobin: 11.9 g/dL — ABNORMAL LOW (ref 12.0–15.0)
Immature Granulocytes: 0 %
Lymphocytes Relative: 24 %
Lymphs Abs: 1.5 10*3/uL (ref 0.7–4.0)
MCH: 26.7 pg (ref 26.0–34.0)
MCHC: 31.4 g/dL (ref 30.0–36.0)
MCV: 85 fL (ref 80.0–100.0)
Monocytes Absolute: 0.8 10*3/uL (ref 0.1–1.0)
Monocytes Relative: 13 %
Neutro Abs: 3.5 10*3/uL (ref 1.7–7.7)
Neutrophils Relative %: 58 %
Platelets: 235 10*3/uL (ref 150–400)
RBC: 4.46 MIL/uL (ref 3.87–5.11)
RDW: 15.3 % (ref 11.5–15.5)
WBC: 6.2 10*3/uL (ref 4.0–10.5)
nRBC: 0 % (ref 0.0–0.2)

## 2022-01-07 LAB — LIPASE, BLOOD: Lipase: 49 U/L (ref 11–51)

## 2022-01-07 MED ORDER — KETOROLAC TROMETHAMINE 30 MG/ML IJ SOLN: 30.0000 mg | Freq: Once | INTRAMUSCULAR | Status: DC

## 2022-01-07 MED ORDER — POLYETHYLENE GLYCOL 3350 17 G PO PACK
17.0000 g | PACK | Freq: Every day | ORAL | Status: DC
Start: 2022-01-08 — End: 2022-01-08
  Administered 2022-01-07: 17 g via ORAL

## 2022-01-07 MED ORDER — ONDANSETRON HCL 4 MG/2ML IJ SOLN: 4.0000 mg | Freq: Once | INTRAMUSCULAR | Status: DC

## 2022-01-07 MED ORDER — SODIUM CHLORIDE 0.9 % IV BOLUS: 1000.0000 mL | Freq: Once | INTRAVENOUS | Status: DC

## 2022-01-07 NOTE — ED Provider Notes (Signed)
[facility] Emergency Department Provider Note     Event Date/Time   First MD Initiated Contact with Patient 01/07/22 2142     (approximate)   History   Flank Pain (left)   HPI  [name] is a 81 y.o. female complains of constipation.  Patient presents after she had a x-ray taken at her facility, and was confirmed to have a moderate stool burden as well as some questionable metallic foreign bodies.  She does admit that they took the x-rays over her clothing which include 2 eye & hook clasps at the waistband.  Patient presents to the ED at the advice of her provider.  Patient denies any nausea, vomiting, or diarrhea patient notes her last bowel movement was 3 days prior.    Physical Exam   Triage Vital Signs: ED Triage Vitals  Enc Vitals Group     BP 01/07/22 1529 (!) 166/76     Pulse Rate 01/07/22 1529 62     Resp 01/07/22 1529 16     Temp 01/07/22 1529 98.8 F (37.1 C)     Temp Source 01/07/22 1529 Oral     SpO2 01/07/22 1529 97 %     Weight 01/07/22 1531 137 lb (62.1 kg)     Height 01/07/22 1531 '5\' 2"'$  (1.575 m)     Head Circumference --      Peak Flow --      Pain Score 01/07/22 1529 2     Pain Loc --      Pain Edu? --      Excl. in [place]? --     Most recent vital signs: Vitals:   01/07/22 1529  BP: (!) 166/76  Pulse: 62  Resp: 16  Temp: 98.8 F (37.1 C)  SpO2: 97%    General Awake, no distress. NAD HEENT NCAT. PERRL. EOMI. No rhinorrhea. Mucous membranes are moist.  CV:  Good peripheral perfusion.  RESP:  Normal effort.  ABD:  No distention.  Nontender.  Normal bowel sounds noted.  No rebound, guarding, or rigidity.  No flank tenderness elicited.  ED Results / Procedures / Treatments   Labs (all labs ordered are listed, but only abnormal results are displayed) Labs Reviewed  CBC WITH DIFFERENTIAL/PLATELET - Abnormal; Notable for the following components:      Result Value   Hemoglobin 11.9 (*)    All other components  within normal limits  COMPREHENSIVE METABOLIC PANEL - Abnormal; Notable for the following components:   BUN 28 (*)    Creatinine, Ser 1.05 (*)    GFR, Estimated 53 (*)    All other components within normal limits  URINALYSIS, ROUTINE W REFLEX MICROSCOPIC - Abnormal; Notable for the following components:   Color, Urine YELLOW (*)    APPearance CLEAR (*)    Protein, ur 100 (*)    Leukocytes,Ua TRACE (*)    Bacteria, UA RARE (*)    All other components within normal limits  LIPASE, BLOOD     EKG   RADIOLOGY  I personally viewed and evaluated these images as part of my medical decision making, as well as reviewing the written report by the radiologist.  ED Provider Interpretation: moderated bowel stool. No obstruction   DG Abd 2 Views  Result Date: 01/07/2022 CLINICAL DATA:  Abdominal pain and constipation for 3 days. EXAM: ABDOMEN - 2 VIEW COMPARISON:  12/29/2021 CT FINDINGS: Scattered gas and stool in the colon and rectum noted. Nondistended gas-filled loops of  small bowel are present. There is no evidence of dilated bowel loops or definite pneumoperitoneum. No suspicious calcifications are present. No acute bony abnormalities are present. IMPRESSION: Nonspecific nonobstructive bowel gas pattern. Electronically Signed   By: Margarette Canada M.D.   On: 01/07/2022 16:30     PROCEDURES:  Critical Care performed: No  Procedures   MEDICATIONS ORDERED IN ED:  Medications  polyethylene glycol (MIRALAX / GLYCOLAX) packet 17 g (17 g Oral Given 01/07/22 2221)     IMPRESSION / MDM / ASSESSMENT AND PLAN / ED COURSE  I reviewed the triage vital signs and the nursing notes.                              Differential diagnosis includes, but is not limited to, constipation, abd pain, SBO, UTI  Patient's presentation is most consistent with acute complicated illness / injury requiring diagnostic workup.  Geriatric patient to the ED for evaluation of 3 days of constipation.  Patient  denies any significant abdominal discomfort.  Patient is evaluated for complaints in the ED with lab work is reassuring without any signs of acute leukocytosis, critical anemia, or electrolyte abnormality.  Plain film of the abdomen reveals moderate stool burden without evidence of obstruction.  No questionable foreign bodies are appreciated.  Patient's diagnosis is consistent with constipation. Patient will be discharged home with directions to take OTC MiraLAX up to 3 times daily to help promote normal soft stools.. Patient is to follow up with primary provider as needed or otherwise directed. Patient is given ED precautions to return to the ED for any worsening or new symptoms.     FINAL CLINICAL IMPRESSION(S) / ED DIAGNOSES   Final diagnoses:  Constipation, unspecified constipation type     Rx / DC Orders   ED Discharge Orders     None        Note:  This document was prepared using Dragon voice recognition software and may include unintentional dictation errors.    Melvenia Needles, PA-C 01/07/22 2241    Harvest Dark, MD 01/07/22 2244

## 2022-01-07 NOTE — ED Triage Notes (Addendum)
Patient c/o left flank pain. Denies N/V/D. Reports last BM X3 days.  Patient had xrays taken today and told to come to ER for questionable metal in abdomen.

## 2022-01-07 NOTE — Discharge Instructions (Addendum)
Your exam, labs, and x-ray are all normal and reassuring at this time.  You have been treated for constipation, and should dose MiraLAX 3 times a day for the next few days to promote normal soft stools.  No signs of a serious infection.  You have indication of some constipation.  No x-ray findings of concern, including strange metallic foreign bodies reported on previous outside x-rays.

## 2022-01-07 NOTE — ED Provider Triage Note (Signed)
Emergency Medicine Provider Triage Evaluation Note  [name], a 81 y.o. female  was evaluated in triage.  Pt complains of constipation.  Patient presents after she had a x-ray taken at her facility, and was confirmed to have a moderate stool burden as well as some questionable metallic foreign bodies.  Patient presents to the ED at the advice of her provider.  Patient denies any nausea, vomiting, or diarrhea patient notes her last bowel movement was 3 days prior.  Review of Systems  Positive: constipation Negative: FCS  Physical Exam  BP (!) 166/76 (BP Location: Left Arm)   Pulse 62   Temp 98.8 F (37.1 C) (Oral)   Resp 16   Ht '5\' 2"'$  (1.575 m)   Wt 62.1 kg   SpO2 97%   BMI 25.06 kg/m  Gen:   Awake, no distress  NAD Resp:  Normal effort CTA MSK:   Moves extremities without difficulty  ABD:  Soft, nontender  Medical Decision Making  Medically screening exam initiated at 3:51 PM.  Appropriate orders placed.  Thereasa Parkin was informed that the remainder of the evaluation will be completed by another provider, this initial triage assessment does not replace that evaluation, and the importance of remaining in the ED until their evaluation is complete.  Geriatric patient to the ED for evaluation of constipation with outside x-rays confirming stool burden as well as some questionable metallic foreign bodies.  Sent to the ED for evaluation of the metallic foreign body seen on plain films.   Melvenia Needles, PA-C 01/07/22 1554

## 2022-01-12 ENCOUNTER — Encounter: Payer: Self-pay | Admitting: Nurse Practitioner

## 2022-01-12 ENCOUNTER — Ambulatory Visit (INDEPENDENT_AMBULATORY_CARE_PROVIDER_SITE_OTHER): Payer: Medicare Other | Admitting: Nurse Practitioner

## 2022-01-12 VITALS — BP 150/66 | HR 56 | Temp 98.4°F | Resp 16 | Ht 62.0 in | Wt 131.0 lb

## 2022-01-12 DIAGNOSIS — M545 Low back pain, unspecified: Secondary | ICD-10-CM | POA: Diagnosis not present

## 2022-01-12 DIAGNOSIS — E1165 Type 2 diabetes mellitus with hyperglycemia: Secondary | ICD-10-CM

## 2022-01-12 DIAGNOSIS — K5909 Other constipation: Secondary | ICD-10-CM

## 2022-01-12 DIAGNOSIS — I10 Essential (primary) hypertension: Secondary | ICD-10-CM | POA: Diagnosis not present

## 2022-01-12 NOTE — Progress Notes (Signed)
[facility] [place], [place] [address]  Internal MEDICINE  Office Visit Note  Patient Name: [name]  [id number]  [id number]  Date of Service: 01/12/2022  Chief Complaint  Patient presents with   Follow-up    ED for constipation   Hyperlipidemia   Hypertension    HPI [name] presents for a follow up visit for constipation and abdominal pain ED visit for constipation, starting to improve Loose stools now, some gas, no abd pain Having some backache/back pain and Soreness, improves once she gets up all the way, hurts when moving from sitting to standing.  Thinks it is from the constipation, denies any significant falls or injuries.    Current Medication: Outpatient Encounter Medications as of 01/12/2022  Medication Sig   acetaminophen (TYLENOL) 500 MG tablet Take 500-1,000 mg by mouth every 6 (six) hours as needed.   Alcohol Swabs (ALCOHOL PREP) PADS    amLODipine (NORVASC) 5 MG tablet TAKE 1 TABLET (5 mg)  BY MOUTH IN  THE EVENING   aspirin EC 81 MG tablet Take 1 tablet (81 mg total) by mouth daily.   atorvastatin (LIPITOR) 10 MG tablet TAKE 1 TABLET BY MOUTH AT  BEDTIME FOR HIGH  CHOLESTEROL   COMBIGAN 0.2-0.5 % ophthalmic solution Apply 1 drop to eye 2 (two) times daily. Left eye   Ferrous Gluconate (IRON 27 PO) Take 27 mg by mouth daily.    folic acid (FOLVITE) 1 MG tablet TAKE 2 TABLETS BY MOUTH  DAILY   glimepiride (AMARYL) 4 MG tablet TAKE ONE-HALF TABLET BY  MOUTH WITH BREAKFAST AND  ONE-HALF TABLET BY MOUTH  WITH DINNER   hydrochlorothiazide (HYDRODIURIL) 25 MG tablet Take 25 mg by mouth daily.   losartan (COZAAR) 100 MG tablet Take 1 tablet (100 mg total) by mouth daily.   methotrexate 2.5 MG tablet Take 15 mg by mouth once a week.    pantoprazole (PROTONIX) 40 MG tablet Take 1 tablet (40 mg total) by mouth daily. TAKE 1 TABLET DAILY FOR    REFLUX   Vitamin D, Ergocalciferol, (DRISDOL) 1.25 MG (50000 UNIT) CAPS capsule Take 1 capsule  (50,000 Units total) by mouth once a week.   No facility-administered encounter medications on file as of 01/12/2022.    Surgical History: Past Surgical History:  Procedure Laterality Date   ABDOMINAL HYSTERECTOMY     ANTERIOR VITRECTOMY Left 03/10/2020   Procedure: ANTERIOR VITRECTOMY;  Surgeon: Leandrew Koyanagi, MD;  Location: [place];  Service: Ophthalmology;  Laterality: Left;   CATARACT EXTRACTION     CATARACT EXTRACTION W/PHACO Left 03/10/2020   Procedure: CATARACT EXTRACTION PHACO  (IOC) LEFT  DIABETIC 16.27  02:09.7  12.5%;  Surgeon: Leandrew Koyanagi, MD;  Location: [place];  Service: Ophthalmology;  Laterality: Left;  Diabetic - oral meds   COLONOSCOPY WITH PROPOFOL N/A 10/25/2017   Procedure: COLONOSCOPY WITH PROPOFOL;  Surgeon: Lollie Sails, MD;  Location: [facility] ENDOSCOPY;  Service: Endoscopy;  Laterality: N/A;   ESOPHAGOGASTRODUODENOSCOPY (EGD) WITH PROPOFOL N/A 10/25/2017   Procedure: ESOPHAGOGASTRODUODENOSCOPY (EGD) WITH PROPOFOL;  Surgeon: Lollie Sails, MD;  Location: [facility] ENDOSCOPY;  Service: Endoscopy;  Laterality: N/A;   ESOPHAGOGASTRODUODENOSCOPY (EGD) WITH PROPOFOL N/A 03/04/2019   Procedure: ESOPHAGOGASTRODUODENOSCOPY (EGD) WITH PROPOFOL;  Surgeon: Lollie Sails, MD;  Location: [facility] ENDOSCOPY;  Service: Endoscopy;  Laterality: N/A;   EUS N/A 01/17/2018   Procedure: ESOPHAGEAL ENDOSCOPIC ULTRASOUND (EUS) RADIAL;  Surgeon: Reita Cliche, MD;  Location: [facility] ENDOSCOPY;  Service: Gastroenterology;  Laterality: N/A;   right side had fatty tissue taken off     TONSILLECTOMY      Medical History: Past Medical History:  Diagnosis Date   Anemia    Arthritis    Diabetes mellitus without complication (HCC)    GERD (gastroesophageal reflux disease)    Heart murmur    Hyperlipidemia    Hypertension    Leaky heart valve    Rheumatoid arthritis (HCC)    Wears dentures     Family History: Family History  Problem Relation  Age of Onset   Hypertension Mother    Diabetes Father    Heart disease Brother 80       Open heart surgery (details unknown)   Breast cancer Neg Hx     Social History   Socioeconomic History   Marital status: Widowed    Spouse name: Not on file   Number of children: Not on file   Years of education: Not on file   Highest education level: Not on file  Occupational History   Not on file  Tobacco Use   Smoking status: Never   Smokeless tobacco: Never  Vaping Use   Vaping Use: Never used  Substance and Sexual Activity   Alcohol use: No   Drug use: No   Sexual activity: Not on file  Other Topics Concern   Not on file  Social History Narrative   Not on file   Social Determinants of Health   Financial Resource Strain: Not on file  Food Insecurity: Not on file  Transportation Needs: Not on file  Physical Activity: Not on file  Stress: Not on file  Social Connections: Not on file  Intimate Partner Violence: Not on file      Review of Systems  Constitutional:  Positive for fever.  HENT: Negative.    Respiratory: Negative.  Negative for cough, chest tightness, shortness of breath and wheezing.   Cardiovascular:  Negative for chest pain and palpitations.  Gastrointestinal:  Positive for abdominal pain, constipation and diarrhea.  Musculoskeletal:  Positive for arthralgias and back pain.  Psychiatric/Behavioral: Negative.      Vital Signs: BP (!) 150/66   Pulse (!) 56   Temp 98.4 F (36.9 C)   Resp 16   Ht '5\' 2"'$  (1.575 m)   Wt 131 lb (59.4 kg)   SpO2 98%   BMI 23.96 kg/m    Physical Exam Vitals reviewed.  Constitutional:      General: She is not in acute distress.    Appearance: Normal appearance. She is normal weight. She is not ill-appearing.  HENT:     Head: Normocephalic and atraumatic.  Eyes:     Pupils: Pupils are equal, round, and reactive to light.  Cardiovascular:     Rate and Rhythm: Normal rate and regular rhythm.  Pulmonary:     Effort:  Pulmonary effort is normal. No respiratory distress.  Neurological:     Mental Status: She is alert and oriented to person, place, and time.  Psychiatric:        Mood and Affect: Mood normal.        Behavior: Behavior normal.        Assessment/Plan: 1. Other constipation Discussed OTC interventions and medications to help prevent constipation  2. Right-sided low back pain without sciatica, unspecified chronicity Back is hurting on right side, might be from constipation but informed to call the clinic if the pain does not improve or gets worse after another week or  2.  3. Essential hypertension Elevated BP, she is in pain which may be increasing her BP further. Recheck BP at next visit in 5 weeks   General Counseling: Kristina verbalizes understanding of the findings of todays visit and agrees with plan of treatment. I have discussed any further diagnostic evaluation that may be needed or ordered today. We also reviewed her medications today. she has been encouraged to call the office with any questions or concerns that should arise related to todays visit.    No orders of the defined types were placed in this encounter.   No orders of the defined types were placed in this encounter.   Return in 5 weeks (on 02/16/2022) for previously scheduled, F/U, Recheck A1C, Alylah Blakney PCP.   Total time spent:30 Minutes Time spent includes review of chart, medications, test results, and follow up plan with the patient.   [place] Controlled Substance Database was reviewed by me.  This patient was seen by Jonetta Osgood, FNP-C in collaboration with Dr. Clayborn Bigness as a part of collaborative care agreement.   Yides Saidi R. Valetta Fuller, MSN, FNP-C Internal medicine

## 2022-01-17 DIAGNOSIS — H35352 Cystoid macular degeneration, left eye: Secondary | ICD-10-CM | POA: Diagnosis not present

## 2022-01-24 DIAGNOSIS — M069 Rheumatoid arthritis, unspecified: Secondary | ICD-10-CM | POA: Diagnosis not present

## 2022-01-24 DIAGNOSIS — M6281 Muscle weakness (generalized): Secondary | ICD-10-CM | POA: Diagnosis not present

## 2022-01-24 DIAGNOSIS — M1990 Unspecified osteoarthritis, unspecified site: Secondary | ICD-10-CM | POA: Diagnosis not present

## 2022-01-24 DIAGNOSIS — E782 Mixed hyperlipidemia: Secondary | ICD-10-CM | POA: Diagnosis not present

## 2022-01-24 DIAGNOSIS — I0989 Other specified rheumatic heart diseases: Secondary | ICD-10-CM | POA: Diagnosis not present

## 2022-01-24 DIAGNOSIS — N1832 Chronic kidney disease, stage 3b: Secondary | ICD-10-CM | POA: Diagnosis not present

## 2022-01-24 DIAGNOSIS — K219 Gastro-esophageal reflux disease without esophagitis: Secondary | ICD-10-CM | POA: Diagnosis not present

## 2022-01-24 DIAGNOSIS — I129 Hypertensive chronic kidney disease with stage 1 through stage 4 chronic kidney disease, or unspecified chronic kidney disease: Secondary | ICD-10-CM | POA: Diagnosis not present

## 2022-01-24 DIAGNOSIS — I471 Supraventricular tachycardia: Secondary | ICD-10-CM | POA: Diagnosis not present

## 2022-01-24 DIAGNOSIS — Z9181 History of falling: Secondary | ICD-10-CM | POA: Diagnosis not present

## 2022-01-24 DIAGNOSIS — Z7984 Long term (current) use of oral hypoglycemic drugs: Secondary | ICD-10-CM | POA: Diagnosis not present

## 2022-01-24 DIAGNOSIS — D508 Other iron deficiency anemias: Secondary | ICD-10-CM | POA: Diagnosis not present

## 2022-01-24 DIAGNOSIS — E1122 Type 2 diabetes mellitus with diabetic chronic kidney disease: Secondary | ICD-10-CM | POA: Diagnosis not present

## 2022-01-25 ENCOUNTER — Ambulatory Visit (INDEPENDENT_AMBULATORY_CARE_PROVIDER_SITE_OTHER): Payer: Medicare Other | Admitting: Nurse Practitioner

## 2022-01-25 VITALS — BP 133/60 | HR 61 | Temp 97.8°F | Resp 16 | Ht 62.0 in | Wt 127.0 lb

## 2022-01-25 DIAGNOSIS — E538 Deficiency of other specified B group vitamins: Secondary | ICD-10-CM

## 2022-01-25 DIAGNOSIS — R10A2 Flank pain, left side: Secondary | ICD-10-CM

## 2022-01-25 DIAGNOSIS — R109 Unspecified abdominal pain: Secondary | ICD-10-CM

## 2022-01-25 DIAGNOSIS — R300 Dysuria: Secondary | ICD-10-CM

## 2022-01-25 DIAGNOSIS — R1012 Left upper quadrant pain: Secondary | ICD-10-CM

## 2022-01-25 LAB — POCT URINALYSIS DIPSTICK
Blood, UA: NEGATIVE
Glucose, UA: NEGATIVE
Ketones, UA: NEGATIVE
Leukocytes, UA: NEGATIVE
Nitrite, UA: NEGATIVE
Protein, UA: POSITIVE — AB
Spec Grav, UA: 1.025 (ref 1.010–1.025)
Urobilinogen, UA: 0.2 E.U./dL
pH, UA: 5 (ref 5.0–8.0)

## 2022-01-25 MED ORDER — CYANOCOBALAMIN 1000 MCG/ML IJ SOLN
1000.0000 ug | Freq: Once | INTRAMUSCULAR | Status: AC
  Administered 2022-01-25: 1000 ug via INTRAMUSCULAR

## 2022-01-25 MED ORDER — CYANOCOBALAMIN 1000 MCG/ML IJ SOLN: 1000.0000 ug | Freq: Once | INTRAMUSCULAR | Status: DC

## 2022-01-25 NOTE — Progress Notes (Signed)
[facility] [place], [place] [address]  Internal MEDICINE  Office Visit Note  Patient Name: [name]  [id number]  [id number]  Date of Service: 01/25/2022  Chief Complaint  Patient presents with   Acute Visit   Back Pain    Left side      HPI Leighton presents for an acute sick visit for Left sided back pain Was having gas pains, constipation, went to hosp 2x Still having pain in left side back but no stomach pain. Some gas. Diverticulosis noted on CT abd 8/3 Normal abd xray on 8/12 Constipation resolved, normal bowel pattern at this time.   Current Medication:  Outpatient Encounter Medications as of 01/25/2022  Medication Sig   acetaminophen (TYLENOL) 500 MG tablet Take 500-1,000 mg by mouth every 6 (six) hours as needed.   Alcohol Swabs (ALCOHOL PREP) PADS    amLODipine (NORVASC) 5 MG tablet TAKE 1 TABLET (5 mg)  BY MOUTH IN  THE EVENING   aspirin EC 81 MG tablet Take 1 tablet (81 mg total) by mouth daily.   atorvastatin (LIPITOR) 10 MG tablet TAKE 1 TABLET BY MOUTH AT  BEDTIME FOR HIGH  CHOLESTEROL   COMBIGAN 0.2-0.5 % ophthalmic solution Apply 1 drop to eye 2 (two) times daily. Left eye   Ferrous Gluconate (IRON 27 PO) Take 27 mg by mouth daily.    folic acid (FOLVITE) 1 MG tablet TAKE 2 TABLETS BY MOUTH  DAILY   glimepiride (AMARYL) 4 MG tablet TAKE ONE-HALF TABLET BY  MOUTH WITH BREAKFAST AND  ONE-HALF TABLET BY MOUTH  WITH DINNER   hydrochlorothiazide (HYDRODIURIL) 25 MG tablet Take 25 mg by mouth daily.   losartan (COZAAR) 100 MG tablet Take 1 tablet (100 mg total) by mouth daily.   methotrexate 2.5 MG tablet Take 15 mg by mouth once a week.    pantoprazole (PROTONIX) 40 MG tablet Take 1 tablet (40 mg total) by mouth daily. TAKE 1 TABLET DAILY FOR    REFLUX   Vitamin D, Ergocalciferol, (DRISDOL) 1.25 MG (50000 UNIT) CAPS capsule Take 1 capsule (50,000 Units total) by mouth once a week.   Facility-Administered Encounter Medications as  of 01/25/2022  Medication   cyanocobalamin (VITAMIN B12) injection 1,000 mcg      Medical History: Past Medical History:  Diagnosis Date   Anemia    Arthritis    Diabetes mellitus without complication (HCC)    GERD (gastroesophageal reflux disease)    Heart murmur    Hyperlipidemia    Hypertension    Leaky heart valve    Rheumatoid arthritis (HCC)    Wears dentures      Vital Signs: BP 133/60   Pulse 61   Temp 97.8 F (36.6 C)   Resp 16   Ht '5\' 2"'$  (1.575 m)   Wt 127 lb (57.6 kg)   SpO2 99%   BMI 23.23 kg/m    Review of Systems  Constitutional:  Positive for fever.  HENT: Negative.    Respiratory: Negative.  Negative for cough, chest tightness, shortness of breath and wheezing.   Cardiovascular:  Negative for chest pain and palpitations.  Gastrointestinal:  Positive for abdominal pain, constipation and diarrhea.  Musculoskeletal:  Positive for arthralgias and back pain.  Psychiatric/Behavioral: Negative.      Physical Exam Vitals reviewed.  Constitutional:      General: She is not in acute distress.    Appearance: Normal appearance. She is normal weight. She is not ill-appearing.  HENT:     Head: Normocephalic and atraumatic.  Eyes:     Pupils: Pupils are equal, round, and reactive to light.  Cardiovascular:     Rate and Rhythm: Normal rate and regular rhythm.  Pulmonary:     Effort: Pulmonary effort is normal. No respiratory distress.  Neurological:     Mental Status: She is alert and oriented to person, place, and time.  Psychiatric:        Mood and Affect: Mood normal.        Behavior: Behavior normal.       Assessment/Plan: 1. Left flank pain CT scan ordered for further evaluation of left flank and back pain - CT Abdomen Pelvis Wo Contrast; Future  2. Left upper quadrant abdominal pain CT scan ordered for further evaluation of LUQ pain - CT Abdomen Pelvis Wo Contrast; Future  3. B12 deficiency B12 injection administered in office  today - cyanocobalamin (VITAMIN B12) injection 1,000 mcg  4. Dysuria Rule out UTI, urinalysis negative - POCT Urinalysis Dipstick   General Counseling: Anjalee verbalizes understanding of the findings of todays visit and agrees with plan of treatment. I have discussed any further diagnostic evaluation that may be needed or ordered today. We also reviewed her medications today. she has been encouraged to call the office with any questions or concerns that should arise related to todays visit.    Counseling:    Orders Placed This Encounter  Procedures   CT Abdomen Pelvis Wo Contrast   POCT Urinalysis Dipstick    Meds ordered this encounter  Medications   cyanocobalamin (VITAMIN B12) injection 1,000 mcg    Return for F/U to discuss CT scan , Daymian Lill PCP.  [place] Controlled Substance Database was reviewed by me for overdose risk score (ORS)  Time spent:30 Minutes Time spent with patient included reviewing progress notes, labs, imaging studies, and discussing plan for follow up.   This patient was seen by Jonetta Osgood, FNP-C in collaboration with Dr. Clayborn Bigness as a part of collaborative care agreement.  Donny Heffern R. Valetta Fuller, MSN, FNP-C Internal Medicine

## 2022-01-27 DIAGNOSIS — N1832 Chronic kidney disease, stage 3b: Secondary | ICD-10-CM | POA: Diagnosis not present

## 2022-01-27 DIAGNOSIS — M6281 Muscle weakness (generalized): Secondary | ICD-10-CM | POA: Diagnosis not present

## 2022-01-27 DIAGNOSIS — Z7984 Long term (current) use of oral hypoglycemic drugs: Secondary | ICD-10-CM | POA: Diagnosis not present

## 2022-01-27 DIAGNOSIS — M069 Rheumatoid arthritis, unspecified: Secondary | ICD-10-CM | POA: Diagnosis not present

## 2022-01-27 DIAGNOSIS — M1990 Unspecified osteoarthritis, unspecified site: Secondary | ICD-10-CM | POA: Diagnosis not present

## 2022-01-27 DIAGNOSIS — E782 Mixed hyperlipidemia: Secondary | ICD-10-CM | POA: Diagnosis not present

## 2022-01-27 DIAGNOSIS — I129 Hypertensive chronic kidney disease with stage 1 through stage 4 chronic kidney disease, or unspecified chronic kidney disease: Secondary | ICD-10-CM | POA: Diagnosis not present

## 2022-01-27 DIAGNOSIS — D508 Other iron deficiency anemias: Secondary | ICD-10-CM | POA: Diagnosis not present

## 2022-01-27 DIAGNOSIS — I0989 Other specified rheumatic heart diseases: Secondary | ICD-10-CM | POA: Diagnosis not present

## 2022-01-27 DIAGNOSIS — Z9181 History of falling: Secondary | ICD-10-CM | POA: Diagnosis not present

## 2022-01-27 DIAGNOSIS — E1122 Type 2 diabetes mellitus with diabetic chronic kidney disease: Secondary | ICD-10-CM | POA: Diagnosis not present

## 2022-01-27 DIAGNOSIS — K219 Gastro-esophageal reflux disease without esophagitis: Secondary | ICD-10-CM | POA: Diagnosis not present

## 2022-01-27 DIAGNOSIS — I471 Supraventricular tachycardia: Secondary | ICD-10-CM | POA: Diagnosis not present

## 2022-02-01 DIAGNOSIS — M6281 Muscle weakness (generalized): Secondary | ICD-10-CM | POA: Diagnosis not present

## 2022-02-01 DIAGNOSIS — K219 Gastro-esophageal reflux disease without esophagitis: Secondary | ICD-10-CM | POA: Diagnosis not present

## 2022-02-01 DIAGNOSIS — I129 Hypertensive chronic kidney disease with stage 1 through stage 4 chronic kidney disease, or unspecified chronic kidney disease: Secondary | ICD-10-CM | POA: Diagnosis not present

## 2022-02-01 DIAGNOSIS — D508 Other iron deficiency anemias: Secondary | ICD-10-CM | POA: Diagnosis not present

## 2022-02-01 DIAGNOSIS — E1122 Type 2 diabetes mellitus with diabetic chronic kidney disease: Secondary | ICD-10-CM | POA: Diagnosis not present

## 2022-02-01 DIAGNOSIS — M1990 Unspecified osteoarthritis, unspecified site: Secondary | ICD-10-CM | POA: Diagnosis not present

## 2022-02-01 DIAGNOSIS — Z9181 History of falling: Secondary | ICD-10-CM | POA: Diagnosis not present

## 2022-02-01 DIAGNOSIS — I471 Supraventricular tachycardia: Secondary | ICD-10-CM | POA: Diagnosis not present

## 2022-02-01 DIAGNOSIS — Z7984 Long term (current) use of oral hypoglycemic drugs: Secondary | ICD-10-CM | POA: Diagnosis not present

## 2022-02-01 DIAGNOSIS — M069 Rheumatoid arthritis, unspecified: Secondary | ICD-10-CM | POA: Diagnosis not present

## 2022-02-01 DIAGNOSIS — I0989 Other specified rheumatic heart diseases: Secondary | ICD-10-CM | POA: Diagnosis not present

## 2022-02-01 DIAGNOSIS — E782 Mixed hyperlipidemia: Secondary | ICD-10-CM | POA: Diagnosis not present

## 2022-02-01 DIAGNOSIS — N1832 Chronic kidney disease, stage 3b: Secondary | ICD-10-CM | POA: Diagnosis not present

## 2022-02-02 DIAGNOSIS — I0989 Other specified rheumatic heart diseases: Secondary | ICD-10-CM | POA: Diagnosis not present

## 2022-02-02 DIAGNOSIS — D508 Other iron deficiency anemias: Secondary | ICD-10-CM | POA: Diagnosis not present

## 2022-02-02 DIAGNOSIS — E1122 Type 2 diabetes mellitus with diabetic chronic kidney disease: Secondary | ICD-10-CM | POA: Diagnosis not present

## 2022-02-02 DIAGNOSIS — Z9181 History of falling: Secondary | ICD-10-CM | POA: Diagnosis not present

## 2022-02-02 DIAGNOSIS — E782 Mixed hyperlipidemia: Secondary | ICD-10-CM | POA: Diagnosis not present

## 2022-02-02 DIAGNOSIS — K219 Gastro-esophageal reflux disease without esophagitis: Secondary | ICD-10-CM | POA: Diagnosis not present

## 2022-02-02 DIAGNOSIS — N1832 Chronic kidney disease, stage 3b: Secondary | ICD-10-CM | POA: Diagnosis not present

## 2022-02-02 DIAGNOSIS — Z7984 Long term (current) use of oral hypoglycemic drugs: Secondary | ICD-10-CM | POA: Diagnosis not present

## 2022-02-02 DIAGNOSIS — M6281 Muscle weakness (generalized): Secondary | ICD-10-CM | POA: Diagnosis not present

## 2022-02-02 DIAGNOSIS — M069 Rheumatoid arthritis, unspecified: Secondary | ICD-10-CM | POA: Diagnosis not present

## 2022-02-02 DIAGNOSIS — I129 Hypertensive chronic kidney disease with stage 1 through stage 4 chronic kidney disease, or unspecified chronic kidney disease: Secondary | ICD-10-CM | POA: Diagnosis not present

## 2022-02-02 DIAGNOSIS — I471 Supraventricular tachycardia: Secondary | ICD-10-CM | POA: Diagnosis not present

## 2022-02-02 DIAGNOSIS — M1990 Unspecified osteoarthritis, unspecified site: Secondary | ICD-10-CM | POA: Diagnosis not present

## 2022-02-03 ENCOUNTER — Ambulatory Visit
Admission: RE | Admit: 2022-02-03 | Discharge: 2022-02-03 | Disposition: A | Discharge status: Home / Self Care | Payer: Medicare Other | Source: Ambulatory Visit | Attending: Nurse Practitioner | Admitting: Nurse Practitioner

## 2022-02-03 DIAGNOSIS — R109 Unspecified abdominal pain: Secondary | ICD-10-CM | POA: Diagnosis not present

## 2022-02-03 DIAGNOSIS — I700 Atherosclerosis of aorta: Secondary | ICD-10-CM | POA: Diagnosis not present

## 2022-02-03 DIAGNOSIS — R1012 Left upper quadrant pain: Secondary | ICD-10-CM | POA: Diagnosis not present

## 2022-02-09 ENCOUNTER — Ambulatory Visit (INDEPENDENT_AMBULATORY_CARE_PROVIDER_SITE_OTHER): Payer: Medicare Other | Admitting: Nurse Practitioner

## 2022-02-09 ENCOUNTER — Encounter: Payer: Self-pay | Admitting: Nurse Practitioner

## 2022-02-09 VITALS — BP 150/60 | HR 60 | Temp 98.2°F | Resp 16 | Ht 62.0 in | Wt 130.0 lb

## 2022-02-09 DIAGNOSIS — Z23 Encounter for immunization: Secondary | ICD-10-CM | POA: Diagnosis not present

## 2022-02-09 DIAGNOSIS — S32010A Wedge compression fracture of first lumbar vertebra, initial encounter for closed fracture: Secondary | ICD-10-CM

## 2022-02-09 DIAGNOSIS — R109 Unspecified abdominal pain: Secondary | ICD-10-CM | POA: Diagnosis not present

## 2022-02-09 MED ORDER — TRAMADOL HCL 50 MG PO TABS: 25.0000 mg | ORAL_TABLET | Freq: Four times a day (QID) | ORAL | 2 refills | Status: DC | PRN

## 2022-02-09 NOTE — Patient Instructions (Signed)
You have an L1 lumbar vertebral compression fracture which can cause back pain that can radiate around the abdomen, and down the legs.   You have been referred to [place] with Dr. Leim Fabry at [facility] in [place]. They will call you to make an appointment.   Your new prescription for pain medication is at [facility] on [address].

## 2022-02-09 NOTE — Progress Notes (Signed)
[facility] [place], [place] [address]  Internal MEDICINE  Office Visit Note  Patient Name: [name]  [id number]  [id number]  Date of Service: 02/09/2022  Chief Complaint  Patient presents with   Follow-up    Ct scan   Diabetes   Hypertension    HPI Lichelle presents for a follow up to review her CT scan Results of CT scan showed L1 compression fracture Wants something for the pain Requests flu shot.    Current Medication: Outpatient Encounter Medications as of 02/09/2022  Medication Sig   acetaminophen (TYLENOL) 500 MG tablet Take 500-1,000 mg by mouth every 6 (six) hours as needed.   Alcohol Swabs (ALCOHOL PREP) PADS    amLODipine (NORVASC) 5 MG tablet TAKE 1 TABLET (5 mg)  BY MOUTH IN  THE EVENING   aspirin EC 81 MG tablet Take 1 tablet (81 mg total) by mouth daily.   atorvastatin (LIPITOR) 10 MG tablet TAKE 1 TABLET BY MOUTH AT  BEDTIME FOR HIGH  CHOLESTEROL   COMBIGAN 0.2-0.5 % ophthalmic solution Apply 1 drop to eye 2 (two) times daily. Left eye   Ferrous Gluconate (IRON 27 PO) Take 27 mg by mouth daily.    folic acid (FOLVITE) 1 MG tablet TAKE 2 TABLETS BY MOUTH  DAILY   glimepiride (AMARYL) 4 MG tablet TAKE ONE-HALF TABLET BY  MOUTH WITH BREAKFAST AND  ONE-HALF TABLET BY MOUTH  WITH DINNER   hydrochlorothiazide (HYDRODIURIL) 25 MG tablet Take 25 mg by mouth daily.   losartan (COZAAR) 100 MG tablet Take 1 tablet (100 mg total) by mouth daily.   methotrexate 2.5 MG tablet Take 15 mg by mouth once a week.    pantoprazole (PROTONIX) 40 MG tablet Take 1 tablet (40 mg total) by mouth daily. TAKE 1 TABLET DAILY FOR    REFLUX   traMADol (ULTRAM) 50 MG tablet Take 0.5-1 tablets (25-50 mg total) by mouth every 6 (six) hours as needed for severe pain.   Vitamin D, Ergocalciferol, (DRISDOL) 1.25 MG (50000 UNIT) CAPS capsule Take 1 capsule (50,000 Units total) by mouth once a week.   No facility-administered encounter medications on file as of  02/09/2022.    Surgical History: Past Surgical History:  Procedure Laterality Date   ABDOMINAL HYSTERECTOMY     ANTERIOR VITRECTOMY Left 03/10/2020   Procedure: ANTERIOR VITRECTOMY;  Surgeon: Leandrew Koyanagi, MD;  Location: [place];  Service: Ophthalmology;  Laterality: Left;   CATARACT EXTRACTION     CATARACT EXTRACTION W/PHACO Left 03/10/2020   Procedure: CATARACT EXTRACTION PHACO  (IOC) LEFT  DIABETIC 16.27  02:09.7  12.5%;  Surgeon: Leandrew Koyanagi, MD;  Location: [place];  Service: Ophthalmology;  Laterality: Left;  Diabetic - oral meds   COLONOSCOPY WITH PROPOFOL N/A 10/25/2017   Procedure: COLONOSCOPY WITH PROPOFOL;  Surgeon: Lollie Sails, MD;  Location: [facility] ENDOSCOPY;  Service: Endoscopy;  Laterality: N/A;   ESOPHAGOGASTRODUODENOSCOPY (EGD) WITH PROPOFOL N/A 10/25/2017   Procedure: ESOPHAGOGASTRODUODENOSCOPY (EGD) WITH PROPOFOL;  Surgeon: Lollie Sails, MD;  Location: [facility] ENDOSCOPY;  Service: Endoscopy;  Laterality: N/A;   ESOPHAGOGASTRODUODENOSCOPY (EGD) WITH PROPOFOL N/A 03/04/2019   Procedure: ESOPHAGOGASTRODUODENOSCOPY (EGD) WITH PROPOFOL;  Surgeon: Lollie Sails, MD;  Location: [facility] ENDOSCOPY;  Service: Endoscopy;  Laterality: N/A;   EUS N/A 01/17/2018   Procedure: ESOPHAGEAL ENDOSCOPIC ULTRASOUND (EUS) RADIAL;  Surgeon: Reita Cliche, MD;  Location: [facility] ENDOSCOPY;  Service: Gastroenterology;  Laterality: N/A;   right side had fatty tissue taken  off     TONSILLECTOMY      Medical History: Past Medical History:  Diagnosis Date   Anemia    Arthritis    Diabetes mellitus without complication (HCC)    GERD (gastroesophageal reflux disease)    Heart murmur    Hyperlipidemia    Hypertension    Leaky heart valve    Rheumatoid arthritis (HCC)    Wears dentures     Family History: Family History  Problem Relation Age of Onset   Hypertension Mother    Diabetes Father    Heart disease Brother 27       Open heart surgery  (details unknown)   Breast cancer Neg Hx     Social History   Socioeconomic History   Marital status: Widowed    Spouse name: Not on file   Number of children: Not on file   Years of education: Not on file   Highest education level: Not on file  Occupational History   Not on file  Tobacco Use   Smoking status: Never   Smokeless tobacco: Never  Vaping Use   Vaping Use: Never used  Substance and Sexual Activity   Alcohol use: No   Drug use: No   Sexual activity: Not on file  Other Topics Concern   Not on file  Social History Narrative   Not on file   Social Determinants of Health   Financial Resource Strain: Not on file  Food Insecurity: Not on file  Transportation Needs: Not on file  Physical Activity: Not on file  Stress: Not on file  Social Connections: Not on file  Intimate Partner Violence: Not on file      Review of Systems  Constitutional:  Negative for chills, fatigue and unexpected weight change.  HENT:  Negative for congestion, rhinorrhea, sneezing and sore throat.   Eyes:  Negative for redness.  Respiratory:  Negative for cough, chest tightness and shortness of breath.   Cardiovascular:  Negative for chest pain and palpitations.  Gastrointestinal:  Negative for abdominal pain, constipation, diarrhea, nausea and vomiting.  Genitourinary:  Positive for flank pain. Negative for dysuria and frequency.  Musculoskeletal:  Positive for arthralgias, back pain and myalgias. Negative for joint swelling and neck pain.  Skin:  Negative for rash.  Neurological: Negative.  Negative for tremors and numbness.  Hematological:  Negative for adenopathy. Does not bruise/bleed easily.  Psychiatric/Behavioral:  Negative for behavioral problems (Depression), sleep disturbance and suicidal ideas. The patient is not nervous/anxious.     Vital Signs: BP (!) 150/60 Comment: 172/66 -- pt is in pain  Pulse 60   Temp 98.2 F (36.8 C)   Resp 16   Ht '5\' 2"'$  (1.575 m)   Wt 130  lb (59 kg)   SpO2 97%   BMI 23.78 kg/m    Physical Exam Vitals reviewed.  Constitutional:      General: She is not in acute distress.    Appearance: Normal appearance. She is normal weight. She is not ill-appearing.  HENT:     Head: Normocephalic and atraumatic.  Eyes:     Pupils: Pupils are equal, round, and reactive to light.  Cardiovascular:     Rate and Rhythm: Normal rate and regular rhythm.  Pulmonary:     Effort: Pulmonary effort is normal. No respiratory distress.  Musculoskeletal:     Lumbar back: Spasms, tenderness and bony tenderness present. Decreased range of motion.  Neurological:     Mental Status: She is alert  and oriented to person, place, and time.  Psychiatric:        Mood and Affect: Mood normal.        Behavior: Behavior normal.        Assessment/Plan: 1. Left flank pain Pain from lumbar spine radiating to left flank, pain med prescribed.  - traMADol (ULTRAM) 50 MG tablet; Take 0.5-1 tablets (25-50 mg total) by mouth every 6 (six) hours as needed for severe pain.  Dispense: 20 tablet; Refill: 2  2. Compression fracture of L1 vertebra, initial encounter (HCC) Pain med prescribed, orthopedic referral for L1 compression fracture - Ambulatory referral to Orthopedic Surgery - traMADol (ULTRAM) 50 MG tablet; Take 0.5-1 tablets (25-50 mg total) by mouth every 6 (six) hours as needed for severe pain.  Dispense: 20 tablet; Refill: 2  3. Needs flu shot Administered in office today - Flu Vaccine MDCK QUAD PF     General Counseling: Yolunda verbalizes understanding of the findings of todays visit and agrees with plan of treatment. I have discussed any further diagnostic evaluation that may be needed or ordered today. We also reviewed her medications today. she has been encouraged to call the office with any questions or concerns that should arise related to todays visit.    Orders Placed This Encounter  Procedures   Flu Vaccine MDCK QUAD PF   Ambulatory  referral to Orthopedic Surgery    Meds ordered this encounter  Medications   traMADol (ULTRAM) 50 MG tablet    Sig: Take 0.5-1 tablets (25-50 mg total) by mouth every 6 (six) hours as needed for severe pain.    Dispense:  20 tablet    Refill:  2    Return if symptoms worsen or fail to improve, for pls change 9/21 appointment to mid october for A1c .   Total time spent:30 Minutes Time spent includes review of chart, medications, test results, and follow up plan with the patient.   [place] Controlled Substance Database was reviewed by me.  This patient was seen by Jonetta Osgood, FNP-C in collaboration with Dr. Clayborn Bigness as a part of collaborative care agreement.   Rojelio Uhrich R. Valetta Fuller, MSN, FNP-C Internal medicine

## 2022-02-14 ENCOUNTER — Telehealth: Payer: Self-pay | Admitting: Nurse Practitioner

## 2022-02-14 NOTE — Telephone Encounter (Signed)
Awaiting 02/09/22 office notes for Orthopedic referral-Toni

## 2022-02-15 DIAGNOSIS — M79675 Pain in left toe(s): Secondary | ICD-10-CM | POA: Diagnosis not present

## 2022-02-15 DIAGNOSIS — M79674 Pain in right toe(s): Secondary | ICD-10-CM | POA: Diagnosis not present

## 2022-02-15 DIAGNOSIS — B351 Tinea unguium: Secondary | ICD-10-CM | POA: Diagnosis not present

## 2022-02-16 ENCOUNTER — Ambulatory Visit: Payer: Medicare Other | Admitting: Nurse Practitioner

## 2022-02-16 DIAGNOSIS — Z7984 Long term (current) use of oral hypoglycemic drugs: Secondary | ICD-10-CM | POA: Diagnosis not present

## 2022-02-16 DIAGNOSIS — M1990 Unspecified osteoarthritis, unspecified site: Secondary | ICD-10-CM | POA: Diagnosis not present

## 2022-02-16 DIAGNOSIS — E1122 Type 2 diabetes mellitus with diabetic chronic kidney disease: Secondary | ICD-10-CM | POA: Diagnosis not present

## 2022-02-16 DIAGNOSIS — E782 Mixed hyperlipidemia: Secondary | ICD-10-CM | POA: Diagnosis not present

## 2022-02-16 DIAGNOSIS — I471 Supraventricular tachycardia: Secondary | ICD-10-CM | POA: Diagnosis not present

## 2022-02-16 DIAGNOSIS — I0989 Other specified rheumatic heart diseases: Secondary | ICD-10-CM | POA: Diagnosis not present

## 2022-02-16 DIAGNOSIS — M6281 Muscle weakness (generalized): Secondary | ICD-10-CM | POA: Diagnosis not present

## 2022-02-16 DIAGNOSIS — D508 Other iron deficiency anemias: Secondary | ICD-10-CM | POA: Diagnosis not present

## 2022-02-16 DIAGNOSIS — N1832 Chronic kidney disease, stage 3b: Secondary | ICD-10-CM | POA: Diagnosis not present

## 2022-02-16 DIAGNOSIS — I129 Hypertensive chronic kidney disease with stage 1 through stage 4 chronic kidney disease, or unspecified chronic kidney disease: Secondary | ICD-10-CM | POA: Diagnosis not present

## 2022-02-16 DIAGNOSIS — Z9181 History of falling: Secondary | ICD-10-CM | POA: Diagnosis not present

## 2022-02-16 DIAGNOSIS — M069 Rheumatoid arthritis, unspecified: Secondary | ICD-10-CM | POA: Diagnosis not present

## 2022-02-16 DIAGNOSIS — K219 Gastro-esophageal reflux disease without esophagitis: Secondary | ICD-10-CM | POA: Diagnosis not present

## 2022-02-23 DIAGNOSIS — I129 Hypertensive chronic kidney disease with stage 1 through stage 4 chronic kidney disease, or unspecified chronic kidney disease: Secondary | ICD-10-CM | POA: Diagnosis not present

## 2022-02-23 DIAGNOSIS — Z9181 History of falling: Secondary | ICD-10-CM | POA: Diagnosis not present

## 2022-02-23 DIAGNOSIS — K219 Gastro-esophageal reflux disease without esophagitis: Secondary | ICD-10-CM | POA: Diagnosis not present

## 2022-02-23 DIAGNOSIS — N1832 Chronic kidney disease, stage 3b: Secondary | ICD-10-CM | POA: Diagnosis not present

## 2022-02-23 DIAGNOSIS — E782 Mixed hyperlipidemia: Secondary | ICD-10-CM | POA: Diagnosis not present

## 2022-02-23 DIAGNOSIS — I0989 Other specified rheumatic heart diseases: Secondary | ICD-10-CM | POA: Diagnosis not present

## 2022-02-23 DIAGNOSIS — M1990 Unspecified osteoarthritis, unspecified site: Secondary | ICD-10-CM | POA: Diagnosis not present

## 2022-02-23 DIAGNOSIS — E1122 Type 2 diabetes mellitus with diabetic chronic kidney disease: Secondary | ICD-10-CM | POA: Diagnosis not present

## 2022-02-23 DIAGNOSIS — D508 Other iron deficiency anemias: Secondary | ICD-10-CM | POA: Diagnosis not present

## 2022-02-23 DIAGNOSIS — M6281 Muscle weakness (generalized): Secondary | ICD-10-CM | POA: Diagnosis not present

## 2022-02-23 DIAGNOSIS — I471 Supraventricular tachycardia: Secondary | ICD-10-CM | POA: Diagnosis not present

## 2022-02-23 DIAGNOSIS — M069 Rheumatoid arthritis, unspecified: Secondary | ICD-10-CM | POA: Diagnosis not present

## 2022-02-23 DIAGNOSIS — Z7984 Long term (current) use of oral hypoglycemic drugs: Secondary | ICD-10-CM | POA: Diagnosis not present

## 2022-02-26 ENCOUNTER — Encounter: Payer: Self-pay | Admitting: Nurse Practitioner

## 2022-02-28 DIAGNOSIS — H35352 Cystoid macular degeneration, left eye: Secondary | ICD-10-CM | POA: Diagnosis not present

## 2022-02-28 DIAGNOSIS — H3552 Pigmentary retinal dystrophy: Secondary | ICD-10-CM | POA: Diagnosis not present

## 2022-03-02 ENCOUNTER — Telehealth: Payer: Self-pay | Admitting: Nurse Practitioner

## 2022-03-02 NOTE — Telephone Encounter (Signed)
Orthopedic referral sent via [facility] to Dr. Leim Fabry w/ [facility]-Toni

## 2022-03-07 ENCOUNTER — Telehealth: Payer: Self-pay | Admitting: Nurse Practitioner

## 2022-03-07 DIAGNOSIS — I471 Supraventricular tachycardia, unspecified: Secondary | ICD-10-CM | POA: Diagnosis not present

## 2022-03-07 DIAGNOSIS — Z9181 History of falling: Secondary | ICD-10-CM | POA: Diagnosis not present

## 2022-03-07 DIAGNOSIS — M6281 Muscle weakness (generalized): Secondary | ICD-10-CM | POA: Diagnosis not present

## 2022-03-07 DIAGNOSIS — K219 Gastro-esophageal reflux disease without esophagitis: Secondary | ICD-10-CM | POA: Diagnosis not present

## 2022-03-07 DIAGNOSIS — I129 Hypertensive chronic kidney disease with stage 1 through stage 4 chronic kidney disease, or unspecified chronic kidney disease: Secondary | ICD-10-CM | POA: Diagnosis not present

## 2022-03-07 DIAGNOSIS — E782 Mixed hyperlipidemia: Secondary | ICD-10-CM | POA: Diagnosis not present

## 2022-03-07 DIAGNOSIS — M069 Rheumatoid arthritis, unspecified: Secondary | ICD-10-CM | POA: Diagnosis not present

## 2022-03-07 DIAGNOSIS — I0989 Other specified rheumatic heart diseases: Secondary | ICD-10-CM | POA: Diagnosis not present

## 2022-03-07 DIAGNOSIS — M1990 Unspecified osteoarthritis, unspecified site: Secondary | ICD-10-CM | POA: Diagnosis not present

## 2022-03-07 DIAGNOSIS — N1832 Chronic kidney disease, stage 3b: Secondary | ICD-10-CM | POA: Diagnosis not present

## 2022-03-07 DIAGNOSIS — Z7984 Long term (current) use of oral hypoglycemic drugs: Secondary | ICD-10-CM | POA: Diagnosis not present

## 2022-03-07 DIAGNOSIS — E1122 Type 2 diabetes mellitus with diabetic chronic kidney disease: Secondary | ICD-10-CM | POA: Diagnosis not present

## 2022-03-07 DIAGNOSIS — D508 Other iron deficiency anemias: Secondary | ICD-10-CM | POA: Diagnosis not present

## 2022-03-07 NOTE — Telephone Encounter (Signed)
Received plan of care from [facility]. Gave to Alyssa for signature-Toni

## 2022-03-08 ENCOUNTER — Encounter: Payer: Self-pay | Admitting: Nurse Practitioner

## 2022-03-08 ENCOUNTER — Ambulatory Visit (INDEPENDENT_AMBULATORY_CARE_PROVIDER_SITE_OTHER): Payer: Medicare Other | Admitting: Nurse Practitioner

## 2022-03-08 VITALS — BP 121/65 | HR 59 | Temp 97.6°F | Resp 16 | Ht 62.0 in | Wt 126.0 lb

## 2022-03-08 DIAGNOSIS — E1165 Type 2 diabetes mellitus with hyperglycemia: Secondary | ICD-10-CM | POA: Diagnosis not present

## 2022-03-08 DIAGNOSIS — S32010A Wedge compression fracture of first lumbar vertebra, initial encounter for closed fracture: Secondary | ICD-10-CM

## 2022-03-08 DIAGNOSIS — E538 Deficiency of other specified B group vitamins: Secondary | ICD-10-CM | POA: Diagnosis not present

## 2022-03-08 LAB — POCT GLYCOSYLATED HEMOGLOBIN (HGB A1C): Hemoglobin A1C: 7.4 % — AB (ref 4.0–5.6)

## 2022-03-08 MED ORDER — PIOGLITAZONE HCL 15 MG PO TABS: 15.0000 mg | ORAL_TABLET | Freq: Every day | ORAL | 2 refills | Status: DC

## 2022-03-08 MED ORDER — VITAMIN B-12 1000 MCG PO TABS: 1000.0000 ug | ORAL_TABLET | Freq: Every day | ORAL | Status: AC

## 2022-03-08 NOTE — Progress Notes (Signed)
[facility] [place], [place] [address]  Internal MEDICINE  Office Visit Note  Patient Name: [name]  [id number]  [id number]  Date of Service: 03/08/2022  Chief Complaint  Patient presents with   Follow-up   Diabetes   Gastroesophageal Reflux   Hypertension   Hyperlipidemia    HPI [name] presents for a follow up visit for diabetes, hypertension, and back pain.  A1c is 7.4 today, increased by 1.0.  Back hurting, working with PT at home, Going to call orthopedic for appt.  Reviewed meds with patient today.    Current Medication: Outpatient Encounter Medications as of 03/08/2022  Medication Sig   acetaminophen (TYLENOL) 500 MG tablet Take 500-1,000 mg by mouth every 6 (six) hours as needed.   Alcohol Swabs (ALCOHOL PREP) PADS    aspirin EC 81 MG tablet Take 1 tablet (81 mg total) by mouth daily.   atorvastatin (LIPITOR) 10 MG tablet TAKE 1 TABLET BY MOUTH AT  BEDTIME FOR HIGH  CHOLESTEROL   COMBIGAN 0.2-0.5 % ophthalmic solution Apply 1 drop to eye 2 (two) times daily. Left eye   cyanocobalamin (VITAMIN B12) 1000 MCG tablet Take 1 tablet (1,000 mcg total) by mouth daily.   Ferrous Gluconate (IRON 27 PO) Take 27 mg by mouth daily.    folic acid (FOLVITE) 1 MG tablet TAKE 2 TABLETS BY MOUTH  DAILY   glimepiride (AMARYL) 4 MG tablet TAKE ONE-HALF TABLET BY  MOUTH WITH BREAKFAST AND  ONE-HALF TABLET BY MOUTH  WITH DINNER   hydrochlorothiazide (HYDRODIURIL) 25 MG tablet Take 25 mg by mouth daily.   methotrexate 2.5 MG tablet Take 15 mg by mouth once a week.    pioglitazone (ACTOS) 15 MG tablet Take 1 tablet (15 mg total) by mouth daily. In the morning.   traMADol (ULTRAM) 50 MG tablet Take 0.5-1 tablets (25-50 mg total) by mouth every 6 (six) hours as needed for severe pain.   [DISCONTINUED] amLODipine (NORVASC) 5 MG tablet TAKE 1 TABLET (5 mg)  BY MOUTH IN  THE EVENING   [DISCONTINUED] losartan (COZAAR) 100 MG tablet Take 1 tablet (100 mg total)  by mouth daily.   [DISCONTINUED] pantoprazole (PROTONIX) 40 MG tablet Take 1 tablet (40 mg total) by mouth daily. TAKE 1 TABLET DAILY FOR    REFLUX   [DISCONTINUED] Vitamin D, Ergocalciferol, (DRISDOL) 1.25 MG (50000 UNIT) CAPS capsule Take 1 capsule (50,000 Units total) by mouth once a week.   No facility-administered encounter medications on file as of 03/08/2022.    Surgical History: Past Surgical History:  Procedure Laterality Date   ABDOMINAL HYSTERECTOMY     ANTERIOR VITRECTOMY Left 03/10/2020   Procedure: ANTERIOR VITRECTOMY;  Surgeon: Leandrew Koyanagi, MD;  Location: [place];  Service: Ophthalmology;  Laterality: Left;   CATARACT EXTRACTION     CATARACT EXTRACTION W/PHACO Left 03/10/2020   Procedure: CATARACT EXTRACTION PHACO  (IOC) LEFT  DIABETIC 16.27  02:09.7  12.5%;  Surgeon: Leandrew Koyanagi, MD;  Location: [place];  Service: Ophthalmology;  Laterality: Left;  Diabetic - oral meds   COLONOSCOPY WITH PROPOFOL N/A 10/25/2017   Procedure: COLONOSCOPY WITH PROPOFOL;  Surgeon: Lollie Sails, MD;  Location: [facility] ENDOSCOPY;  Service: Endoscopy;  Laterality: N/A;   ESOPHAGOGASTRODUODENOSCOPY (EGD) WITH PROPOFOL N/A 10/25/2017   Procedure: ESOPHAGOGASTRODUODENOSCOPY (EGD) WITH PROPOFOL;  Surgeon: Lollie Sails, MD;  Location: [facility] ENDOSCOPY;  Service: Endoscopy;  Laterality: N/A;   ESOPHAGOGASTRODUODENOSCOPY (EGD) WITH PROPOFOL N/A 03/04/2019   Procedure: ESOPHAGOGASTRODUODENOSCOPY (  EGD) WITH PROPOFOL;  Surgeon: Lollie Sails, MD;  Location: [facility] ENDOSCOPY;  Service: Endoscopy;  Laterality: N/A;   EUS N/A 01/17/2018   Procedure: ESOPHAGEAL ENDOSCOPIC ULTRASOUND (EUS) RADIAL;  Surgeon: Reita Cliche, MD;  Location: [facility] ENDOSCOPY;  Service: Gastroenterology;  Laterality: N/A;   right side had fatty tissue taken off     TONSILLECTOMY      Medical History: Past Medical History:  Diagnosis Date   Anemia    Arthritis    Diabetes mellitus  without complication (HCC)    GERD (gastroesophageal reflux disease)    Heart murmur    Hyperlipidemia    Hypertension    Leaky heart valve    Rheumatoid arthritis (HCC)    Wears dentures     Family History: Family History  Problem Relation Age of Onset   Hypertension Mother    Diabetes Father    Heart disease Brother 45       Open heart surgery (details unknown)   Breast cancer Neg Hx     Social History   Socioeconomic History   Marital status: Widowed    Spouse name: Not on file   Number of children: Not on file   Years of education: Not on file   Highest education level: Not on file  Occupational History   Not on file  Tobacco Use   Smoking status: Never   Smokeless tobacco: Never  Vaping Use   Vaping Use: Never used  Substance and Sexual Activity   Alcohol use: No   Drug use: No   Sexual activity: Not on file  Other Topics Concern   Not on file  Social History Narrative   Not on file   Social Determinants of Health   Financial Resource Strain: Not on file  Food Insecurity: Not on file  Transportation Needs: Not on file  Physical Activity: Not on file  Stress: Not on file  Social Connections: Not on file  Intimate Partner Violence: Not on file      Review of Systems  Constitutional:  Negative for chills, fatigue and unexpected weight change.  HENT:  Negative for congestion, rhinorrhea, sneezing and sore throat.   Eyes:  Negative for redness.  Respiratory:  Negative for cough, chest tightness and shortness of breath.   Cardiovascular:  Negative for chest pain and palpitations.  Gastrointestinal:  Negative for abdominal pain, constipation, diarrhea, nausea and vomiting.  Genitourinary:  Positive for flank pain. Negative for dysuria and frequency.  Musculoskeletal:  Positive for arthralgias, back pain and myalgias. Negative for joint swelling and neck pain.  Skin:  Negative for rash.  Neurological: Negative.  Negative for tremors and numbness.   Hematological:  Negative for adenopathy. Does not bruise/bleed easily.  Psychiatric/Behavioral:  Negative for behavioral problems (Depression), sleep disturbance and suicidal ideas. The patient is not nervous/anxious.     Vital Signs: BP 121/65   Pulse (!) 59   Temp 97.6 F (36.4 C)   Resp 16   Ht '5\' 2"'$  (1.575 m)   Wt 126 lb (57.2 kg)   SpO2 97%   BMI 23.05 kg/m    Physical Exam Vitals reviewed.  Constitutional:      General: She is not in acute distress.    Appearance: Normal appearance. She is normal weight. She is not ill-appearing.  HENT:     Head: Normocephalic and atraumatic.  Eyes:     Pupils: Pupils are equal, round, and reactive to light.  Cardiovascular:     Rate  and Rhythm: Normal rate and regular rhythm.  Pulmonary:     Effort: Pulmonary effort is normal. No respiratory distress.  Musculoskeletal:     Lumbar back: Spasms, tenderness and bony tenderness present. Decreased range of motion.  Neurological:     Mental Status: She is alert and oriented to person, place, and time.  Psychiatric:        Mood and Affect: Mood normal.        Behavior: Behavior normal.        Assessment/Plan: 1. Type 2 diabetes mellitus with hyperglycemia, without long-term current use of insulin (HCC) Significant increase in A1C, added pioglitazone. No meds were discontinued, please continue all medications as prescribed and start pioglitazone as prescribed. Repeat A1c in 3 months.  - POCT glycosylated hemoglobin (Hb A1C) - pioglitazone (ACTOS) 15 MG tablet; Take 1 tablet (15 mg total) by mouth daily. In the morning.  Dispense: 30 tablet; Refill: 2  2. B12 deficiency Taking OTC oral supplement - cyanocobalamin (VITAMIN B12) 1000 MCG tablet; Take 1 tablet (1,000 mcg total) by mouth daily.  3. Compression fracture of L1 vertebra, initial encounter [facility]) Continue physical therapy as ordered and will go see orthopedic.    General Counseling: [name] verbalizes understanding of  the findings of todays visit and agrees with plan of treatment. I have discussed any further diagnostic evaluation that may be needed or ordered today. We also reviewed her medications today. she has been encouraged to call the office with any questions or concerns that should arise related to todays visit.    Orders Placed This Encounter  Procedures   POCT glycosylated hemoglobin (Hb A1C)    Meds ordered this encounter  Medications   cyanocobalamin (VITAMIN B12) 1000 MCG tablet    Sig: Take 1 tablet (1,000 mcg total) by mouth daily.   pioglitazone (ACTOS) 15 MG tablet    Sig: Take 1 tablet (15 mg total) by mouth daily. In the morning.    Dispense:  30 tablet    Refill:  2    New med fill asap.    Return in about 3 months (around 06/08/2022) for F/U, Recheck A1C, Carreen Milius PCP.   Total time spent:30 Minutes Time spent includes review of chart, medications, test results, and follow up plan with the patient.   [place] Controlled Substance Database was reviewed by me.  This patient was seen by Jonetta Osgood, FNP-C in collaboration with Dr. Clayborn Bigness as a part of collaborative care agreement.   Yaretzi Ernandez R. Valetta Fuller, MSN, FNP-C Internal medicine

## 2022-03-09 ENCOUNTER — Telehealth: Payer: Self-pay | Admitting: Nurse Practitioner

## 2022-03-09 NOTE — Telephone Encounter (Signed)
HomeHealth 65/99/35 certification signed. Faxed back [phone]. Scanned-Toni

## 2022-03-15 ENCOUNTER — Telehealth: Payer: Self-pay | Admitting: Nurse Practitioner

## 2022-03-15 NOTE — Telephone Encounter (Signed)
Received 03/09/22 orders from [facility]. Gave to Alyssa for signature-Toni

## 2022-03-16 DIAGNOSIS — M069 Rheumatoid arthritis, unspecified: Secondary | ICD-10-CM | POA: Diagnosis not present

## 2022-03-16 DIAGNOSIS — M1990 Unspecified osteoarthritis, unspecified site: Secondary | ICD-10-CM | POA: Diagnosis not present

## 2022-03-16 DIAGNOSIS — M6281 Muscle weakness (generalized): Secondary | ICD-10-CM | POA: Diagnosis not present

## 2022-03-16 DIAGNOSIS — N1832 Chronic kidney disease, stage 3b: Secondary | ICD-10-CM | POA: Diagnosis not present

## 2022-03-16 DIAGNOSIS — E782 Mixed hyperlipidemia: Secondary | ICD-10-CM | POA: Diagnosis not present

## 2022-03-16 DIAGNOSIS — E1122 Type 2 diabetes mellitus with diabetic chronic kidney disease: Secondary | ICD-10-CM | POA: Diagnosis not present

## 2022-03-16 DIAGNOSIS — Z9181 History of falling: Secondary | ICD-10-CM | POA: Diagnosis not present

## 2022-03-16 DIAGNOSIS — K219 Gastro-esophageal reflux disease without esophagitis: Secondary | ICD-10-CM | POA: Diagnosis not present

## 2022-03-16 DIAGNOSIS — D508 Other iron deficiency anemias: Secondary | ICD-10-CM | POA: Diagnosis not present

## 2022-03-16 DIAGNOSIS — Z7984 Long term (current) use of oral hypoglycemic drugs: Secondary | ICD-10-CM | POA: Diagnosis not present

## 2022-03-16 DIAGNOSIS — I0989 Other specified rheumatic heart diseases: Secondary | ICD-10-CM | POA: Diagnosis not present

## 2022-03-16 DIAGNOSIS — I129 Hypertensive chronic kidney disease with stage 1 through stage 4 chronic kidney disease, or unspecified chronic kidney disease: Secondary | ICD-10-CM | POA: Diagnosis not present

## 2022-03-16 DIAGNOSIS — I471 Supraventricular tachycardia, unspecified: Secondary | ICD-10-CM | POA: Diagnosis not present

## 2022-03-17 ENCOUNTER — Encounter: Payer: Self-pay | Admitting: Nurse Practitioner

## 2022-03-17 ENCOUNTER — Telehealth: Payer: Self-pay | Admitting: Nurse Practitioner

## 2022-03-17 DIAGNOSIS — S32010A Wedge compression fracture of first lumbar vertebra, initial encounter for closed fracture: Secondary | ICD-10-CM | POA: Diagnosis not present

## 2022-03-17 DIAGNOSIS — G8929 Other chronic pain: Secondary | ICD-10-CM | POA: Diagnosis not present

## 2022-03-17 DIAGNOSIS — M5442 Lumbago with sciatica, left side: Secondary | ICD-10-CM | POA: Diagnosis not present

## 2022-03-17 DIAGNOSIS — M25552 Pain in left hip: Secondary | ICD-10-CM | POA: Diagnosis not present

## 2022-03-17 NOTE — Telephone Encounter (Signed)
Orthopedic appointment 03/17/22 with [facility]-Toni

## 2022-03-21 ENCOUNTER — Other Ambulatory Visit: Payer: Self-pay | Admitting: Nurse Practitioner

## 2022-03-21 DIAGNOSIS — E1165 Type 2 diabetes mellitus with hyperglycemia: Secondary | ICD-10-CM

## 2022-03-22 ENCOUNTER — Telehealth: Payer: Self-pay | Admitting: Nurse Practitioner

## 2022-03-22 NOTE — Telephone Encounter (Signed)
03/09/22 order faxed back to [facility]; [phone]

## 2022-03-22 NOTE — Telephone Encounter (Signed)
Received 03/15/22 order from [facility]. Gave to Alyssa for signature-Toni

## 2022-03-23 ENCOUNTER — Telehealth: Payer: Self-pay | Admitting: Nurse Practitioner

## 2022-03-23 DIAGNOSIS — I471 Supraventricular tachycardia, unspecified: Secondary | ICD-10-CM | POA: Diagnosis not present

## 2022-03-23 DIAGNOSIS — M6281 Muscle weakness (generalized): Secondary | ICD-10-CM | POA: Diagnosis not present

## 2022-03-23 DIAGNOSIS — I0989 Other specified rheumatic heart diseases: Secondary | ICD-10-CM | POA: Diagnosis not present

## 2022-03-23 DIAGNOSIS — I129 Hypertensive chronic kidney disease with stage 1 through stage 4 chronic kidney disease, or unspecified chronic kidney disease: Secondary | ICD-10-CM | POA: Diagnosis not present

## 2022-03-23 DIAGNOSIS — E782 Mixed hyperlipidemia: Secondary | ICD-10-CM | POA: Diagnosis not present

## 2022-03-23 DIAGNOSIS — M1990 Unspecified osteoarthritis, unspecified site: Secondary | ICD-10-CM | POA: Diagnosis not present

## 2022-03-23 DIAGNOSIS — E1122 Type 2 diabetes mellitus with diabetic chronic kidney disease: Secondary | ICD-10-CM | POA: Diagnosis not present

## 2022-03-23 DIAGNOSIS — Z7984 Long term (current) use of oral hypoglycemic drugs: Secondary | ICD-10-CM | POA: Diagnosis not present

## 2022-03-23 DIAGNOSIS — Z9181 History of falling: Secondary | ICD-10-CM | POA: Diagnosis not present

## 2022-03-23 DIAGNOSIS — D508 Other iron deficiency anemias: Secondary | ICD-10-CM | POA: Diagnosis not present

## 2022-03-23 DIAGNOSIS — M069 Rheumatoid arthritis, unspecified: Secondary | ICD-10-CM | POA: Diagnosis not present

## 2022-03-23 DIAGNOSIS — K219 Gastro-esophageal reflux disease without esophagitis: Secondary | ICD-10-CM | POA: Diagnosis not present

## 2022-03-23 DIAGNOSIS — N1832 Chronic kidney disease, stage 3b: Secondary | ICD-10-CM | POA: Diagnosis not present

## 2022-03-23 NOTE — Telephone Encounter (Signed)
02/23/22 & 03/15/22 order faxed back to [facility]; [phone]

## 2022-03-25 ENCOUNTER — Other Ambulatory Visit: Payer: Self-pay | Admitting: Physician Assistant

## 2022-03-25 ENCOUNTER — Other Ambulatory Visit: Payer: Self-pay | Admitting: Nurse Practitioner

## 2022-03-25 DIAGNOSIS — I10 Essential (primary) hypertension: Secondary | ICD-10-CM

## 2022-03-25 DIAGNOSIS — K219 Gastro-esophageal reflux disease without esophagitis: Secondary | ICD-10-CM

## 2022-03-26 ENCOUNTER — Encounter: Payer: Self-pay | Admitting: Nurse Practitioner

## 2022-04-06 ENCOUNTER — Telehealth: Payer: Self-pay | Admitting: Physician Assistant

## 2022-04-06 NOTE — Telephone Encounter (Signed)
 *  STAT* If patient is at the pharmacy, call can be transferred to refill team.   1. Which medications need to be refilled? (please list name of each medication and dose if known) methotrexate 2.5 MG tablet   2. Which pharmacy/location (including street and city if local pharmacy) is medication to be sent to?   [facility] ([place] [place]    3. Do they need a 30 day or 90 day supply? 90 days

## 2022-04-06 NOTE — Progress Notes (Signed)
Cardiology Office Note    Date:  04/10/2022   ID:  [name], DOB [birth year], MRN [id number]  PCP:  Jonetta Osgood, NP  Cardiologist:  Nelva Bush, MD  Electrophysiologist:  Vickie Epley, MD   Chief Complaint: Follow-up  History of Present Illness:   [name] is a 81 y.o. female with history of pSVT, valvular heart disease including mild to moderate mitral regurgitation, aortic insufficiency, tricuspid regurgitation, and mild aortic stenosis, vasovagal syncope, DM2, HTN, HLD, RA, and anemia who presents for follow-up of PSVT and valvular heart disease.    Prior echo, obtained through outside office, showed a hyperdynamic LVSF with an EF > 70%, moderate LVH, small left ventricle, normal RVSF and ventricular cavity size, mild AS, moderate AI, mild MR, moderate TR, mild pulmonary hypertension. She has since been followed at our [facility] office, noting stable exertional dyspnea and sporadic "twitches" of randomly occurring chest pain.   She underwent echo in 02/2020 for follow-up of her valvular heart disease which demonstrated an EF of 60 to 65%, no regional wall motion abnormalities, grade 1 diastolic dysfunction, mild LVH, normal RV systolic function and ventricular cavity size, mildly elevated PASP at 42.2 mmHg, mild mitral regurgitation, mild aortic valve insufficiency, mild to moderate aortic valve sclerosis without evidence of stenosis.  When compared to prior echo, her valvular heart disease was stable and her right-sided pressure was improved.  She was seen in the office in 04/2020 for follow-up of hypertension at which time she was doing relatively well.  She was tolerating the increased dose of losartan.  She did continue to note intermittent palpitations that would last a second or 2 without associated symptoms.  It was recommended to possibly discontinue hydralazine in follow-up for ease of dosing her medications if her blood pressure remained well controlled.   Following this visit, there was some confusion regarding her HCTZ as it appeared this was discontinued in late 04/2020 though she continued to take this medication.  She was seen in 06/2020 and reported a syncopal episode in late 04/2020.  Outpatient cardiac monitoring demonstrated a predominant rhythm of sinus with an average rate of 66 bpm (range 47 to 120 bpm in sinus), a single 7 beat run of NSVT occurred with a maximum rate of 152 bpm, 157 episodes of SVT lasting up to 26.5 seconds with a maximum rate of 207 bpm, occasional PACs with a 1.8% burden, rare PVCs, and no patient triggered events.  She was subsequently evaluated by EP for her episode of syncope and frequent episodes of SVT noted on outpatient cardiac monitoring with noted inability to take a beta-blocker due to resting bradycardia.  EP felt her syncopal episode was consistent with vasovagal etiology.  With regards to her SVT, pharmacologic therapy was deferred given that she was asymptomatic with these runs, and in the context of resting bradycardia.  SVT was not felt to have contributed to her syncopal episode.  She was seen in the office in 07/2021 and was without symptoms of angina or decompensation.  She did note occasional palpitations, mostly when stressed.  She was without further episodes of syncope.  She was bradycardic with a heart rate of 51 and a BP of 90/50, though asymptomatic.  She underwent outpatient cardiac monitoring which demonstrated a predominant rhythm of sinus with an average rate of 57 bpm (range 42 to 106 bpm in sinus), occasional PACs with a 1.1% burden, rare PVCs, 1 episode of NSVT lasting 6 beats with a maximum  rate of 116 bpm, 109 atrial runs lasting up to 14.7 seconds with a maximum rate of 193 bpm.  No sustained arrhythmias or prolonged pauses.  No patient triggered events.  She was last seen in the office in 09/2021 and was without angina or decompensation.  Her main limiting factor at that time was underlying  arthritis.  She was admitted to the hospital in 12/2021 with generalized weakness and hypertensive urgency.  High-sensitivity troponin normal.  Echo showed an EF of 60 to 65%, no regional wall motion abnormalities, grade 1 diastolic dysfunction, normal RV systolic function and ventricular cavity size, mildly elevated PASP estimated at 42.9 mmHg, moderately dilated left atrium, mild mitral regurgitation with moderate mitral annular calcification, mild aortic insufficiency, and an estimated right atrial pressure of 3 mmHg.  MRI of the brain did not show evidence of acute stroke or abnormality.  Following the above admission, she was seen in the ED after sustaining a mechanical fall with a closed compression fracture of L1.  She did not hit her head or suffer LOC.  No syncope.  She comes in doing well from a cardiac perspective and is without symptoms of angina or decompensation.  No palpitations, dizziness, presyncope, or syncope.  No lower extremity swelling.  She continues to deal with back pain following her compression fracture, though this is improving.  She also reports difficulty sleeping and some cognitive decline.  Blood pressures have been well controlled.  Tolerating antihypertensive therapy.   Labs independently reviewed: 02/2022 - A1c 7.4 12/2021 - potassium 4.0, BUN 28, serum creatinine 1.05, albumin 3.8, AST/ALT normal, Hgb 11.9, PLT 235, TSH normal 10/2019 - TC 207, TG 49, HDL 79, LDL 118   Past Medical History:  Diagnosis Date   Anemia    Arthritis    Diabetes mellitus without complication (HCC)    GERD (gastroesophageal reflux disease)    Heart murmur    Hyperlipidemia    Hypertension    Leaky heart valve    Rheumatoid arthritis ([place])    Wears dentures     Past Surgical History:  Procedure Laterality Date   ABDOMINAL HYSTERECTOMY     ANTERIOR VITRECTOMY Left 03/10/2020   Procedure: ANTERIOR VITRECTOMY;  Surgeon: Leandrew Koyanagi, MD;  Location: [place];   Service: Ophthalmology;  Laterality: Left;   CATARACT EXTRACTION     CATARACT EXTRACTION W/PHACO Left 03/10/2020   Procedure: CATARACT EXTRACTION PHACO  (IOC) LEFT  DIABETIC 16.27  02:09.7  12.5%;  Surgeon: Leandrew Koyanagi, MD;  Location: [place];  Service: Ophthalmology;  Laterality: Left;  Diabetic - oral meds   COLONOSCOPY WITH PROPOFOL N/A 10/25/2017   Procedure: COLONOSCOPY WITH PROPOFOL;  Surgeon: Lollie Sails, MD;  Location[facility] ENDOSCOPY;  Service: Endoscopy;  Laterality: N/A;   ESOPHAGOGASTRODUODENOSCOPY (EGD) WITH PROPOFOL N/A 10/25/2017   Procedure: ESOPHAGOGASTRODUODENOSCOPY (EGD) WITH PROPOFOL;  Surgeon: Lollie Sails, MD;  Location: [facility] ENDOSCOPY;  Service: Endoscopy;  Laterality: N/A;   ESOPHAGOGASTRODUODENOSCOPY (EGD) WITH PROPOFOL N/A 03/04/2019   Procedure: ESOPHAGOGASTRODUODENOSCOPY (EGD) WITH PROPOFOL;  Surgeon: Lollie Sails, MD;  Location[facility]COPY;  Service: Endoscopy;  Laterality: N/A;   EUS N/A 01/17/2018   Procedure: ESOPHAGEAL ENDOSCOPIC ULTRASOUND (EUS) RADIAL;  Surgeon: Reita Cliche, MD;  Location: [facility] ENDOSCOPY;  Service: Gastroenterology;  Laterality: N/A;   right side had fatty tissue taken off     TONSILLECTOMY      Current Medications: Current Meds  Medication Sig   acetaminophen (TYLENOL) 500 MG tablet Take 500-1,000  mg by mouth every 6 (six) hours as needed.   Alcohol Swabs (ALCOHOL PREP) PADS    aspirin EC 81 MG tablet Take 1 tablet (81 mg total) by mouth daily.   atorvastatin (LIPITOR) 10 MG tablet TAKE 1 TABLET BY MOUTH AT  BEDTIME FOR HIGH  CHOLESTEROL   COMBIGAN 0.2-0.5 % ophthalmic solution Apply 1 drop to eye 2 (two) times daily. Left eye   cyanocobalamin (VITAMIN B12) 1000 MCG tablet Take 1 tablet (1,000 mcg total) by mouth daily.   Ferrous Gluconate (IRON 27 PO) Take 27 mg by mouth daily.    folic acid (FOLVITE) 1 MG tablet TAKE 2 TABLETS BY MOUTH  DAILY   glimepiride (AMARYL) 4 MG tablet TAKE ONE-HALF  TABLET BY  MOUTH WITH BREAKFAST AND  ONE-HALF TABLET BY MOUTH  WITH DINNER   hydrochlorothiazide (HYDRODIURIL) 25 MG tablet Take 25 mg by mouth daily.   methotrexate 2.5 MG tablet Take 15 mg by mouth once a week.    pantoprazole (PROTONIX) 40 MG tablet TAKE 1 TABLET BY MOUTH DAILY FOR REFLUX   pioglitazone (ACTOS) 15 MG tablet Take 1 tablet (15 mg total) by mouth daily. In the morning.   traMADol (ULTRAM) 50 MG tablet Take 0.5-1 tablets (25-50 mg total) by mouth every 6 (six) hours as needed for severe pain.   [DISCONTINUED] amLODipine (NORVASC) 5 MG tablet TAKE 1 TABLET (5 mg)  BY MOUTH IN  THE EVENING   [DISCONTINUED] losartan (COZAAR) 100 MG tablet Take 1 tablet (100 mg total) by mouth daily.    Allergies:   Patient has no known allergies.   Social History   Socioeconomic History   Marital status: Widowed    Spouse name: Not on file   Number of children: Not on file   Years of education: Not on file   Highest education level: Not on file  Occupational History   Not on file  Tobacco Use   Smoking status: Never   Smokeless tobacco: Never  Vaping Use   Vaping Use: Never used  Substance and Sexual Activity   Alcohol use: No   Drug use: No   Sexual activity: Not on file  Other Topics Concern   Not on file  Social History Narrative   Not on file   Social Determinants of Health   Financial Resource Strain: Not on file  Food Insecurity: Not on file  Transportation Needs: Not on file  Physical Activity: Not on file  Stress: Not on file  Social Connections: Not on file     Family History:  The patient's family history includes Diabetes in her father; Heart disease (age of onset: 67) in her brother; Hypertension in her mother. There is no history of Breast cancer.  ROS:   12-point review of systems is negative unless otherwise noted in HPI.   EKGs/Labs/Other Studies Reviewed:    Studies reviewed were summarized above. The additional studies were reviewed today:  2D  echo 12/29/2021: 1. Left ventricular ejection fraction, by estimation, is 60 to 65%. The  left ventricle has normal function. The left ventricle has no regional  wall motion abnormalities. Left ventricular diastolic parameters are  consistent with Grade I diastolic  dysfunction (impaired relaxation).   2. Right ventricular systolic function is normal. The right ventricular  size is normal. There is mildly elevated pulmonary artery systolic  pressure. The estimated right ventricular systolic pressure is 38.1 mmHg.   3. Left atrial size was moderately dilated.   4. The mitral valve  is normal in structure. Mild mitral valve  regurgitation. No evidence of mitral stenosis. Moderate mitral annular  calcification.   5. The aortic valve is normal in structure. Aortic valve regurgitation is  mild. No aortic stenosis is present.   6. The inferior vena cava is normal in size with greater than 50%  respiratory variability, suggesting right atrial pressure of 3 mmHg.  __________  Elwyn Reach patch 07/2021: The patient was monitored for 14 days. The predominant rhythm was sinus with an average rate of 57 bpm (range 42-106 bpm in sinus). There were occasional PAC's (burden 1.1%) and rare PVC's. One episode of nonsustained ventricular tachycardia occurred, lasting 6 beats with a maximum rate of 116 bpm. There were 109 atrial runs lasting up to 14.7 seconds with a maximum rate of 193 bpm. No sustained arrhythmia or prolonged pause occurred. There were no patient triggered events.   Predominantly sinus rhythm with occasional PAC's and rare PVC's.  Multiple episodes of PSVT occurred, as well as a single brief run of NSVT. ___________   Elwyn Reach patch 06/2020: The patient was monitored for 14 days. The predominant rhythm was sinus with an average rate of 66 bpm (range 47 - 120 bpm in sinus). There were occasional PAC's (1.8% burden) and rare PVC's. A single 7-beat run of nonsustained ventricular tachycardia occurred  with a maximum rate of 152 bpm. There were 157 episodes of supraventricular tachycardia lasting up to 26.5 seconds with a maximum rate of 207 bpm. There were no patient triggered events.   Predominantly sinus rhythm with occasional PAC and rare PVC's.  157 episodes of PSVT occurred, as well as one short run of NSVT. __________   2D echo 03/04/2020: 1. Left ventricular ejection fraction, by estimation, is 60 to 65%. The  left ventricle has normal function. The left ventricle has no regional  wall motion abnormalities. There is mild left ventricular hypertrophy.  Left ventricular diastolic parameters  are consistent with Grade I diastolic dysfunction (impaired relaxation).   2. Right ventricular systolic function is normal. The right ventricular  size is normal. There is mildly elevated pulmonary artery systolic  pressure. The estimated right ventricular systolic pressure is 39.7 mmHg.   3. Mild mitral valve regurgitation.   4. The aortic valve is normal in structure. Aortic valve regurgitation is  mild. Mild to moderate aortic valve sclerosis/calcification is present,  without any evidence of aortic stenosis. __________   2D echo 12/2018 (outside office): Hyperdynamic EF > 70%, moderate LVH with small left ventricle, mild aortic valve calcification with mild stenosis and moderate regurgitation, moderate mitral annular calcification with mild regurgitation, mild tricuspid regurgitation, mild pulmonary hypertension   EKG:  EKG is ordered today.  The EKG ordered today demonstrates sinus bradycardia, 53 bpm, LVH, nonspecific ST-T changes  Recent Labs: 08/17/2021: Magnesium 1.8 12/29/2021: TSH 1.041 01/07/2022: ALT 20; BUN 28; Creatinine, Ser 1.05; Hemoglobin 11.9; Platelets 235; Potassium 4.0; Sodium 139  Recent Lipid Panel    Component Value Date/Time   CHOL 207 (H) 10/31/2019 0906   CHOL 228 (H) 07/25/2018 1624   TRIG 49 10/31/2019 0906   HDL 79 10/31/2019 0906   HDL 92 07/25/2018 1624    CHOLHDL 2.6 10/31/2019 0906   VLDL 10 10/31/2019 0906   LDLCALC 118 (H) 10/31/2019 0906   LDLCALC 120 (H) 07/25/2018 1624    PHYSICAL EXAM:    VS:  BP (!) 140/70 (BP Location: Left Arm, Patient Position: Sitting, Cuff Size: Normal)   Pulse (!) 53   Ht  $'5\' 2"'A$  (1.575 m)   Wt 127 lb 6.4 oz (57.8 kg)   SpO2 99%   BMI 23.30 kg/m   BMI: Body mass index is 23.3 kg/m.  Physical Exam Vitals reviewed.  Constitutional:      Appearance: She is well-developed.  HENT:     Head: Normocephalic and atraumatic.  Eyes:     General:        Right eye: No discharge.        Left eye: No discharge.  Neck:     Vascular: No JVD.  Cardiovascular:     Rate and Rhythm: Normal rate and regular rhythm.     Pulses:          Posterior tibial pulses are 2+ on the right side and 2+ on the left side.     Heart sounds: S1 normal and S2 normal. Heart sounds not distant. No midsystolic click and no opening snap. Murmur heard.     Systolic murmur is present with a grade of 1/6 at the upper right sternal border.     Systolic murmur of grade 1/6 is also present at the lower left sternal border.     No friction rub.  Pulmonary:     Effort: Pulmonary effort is normal. No respiratory distress.     Breath sounds: Normal breath sounds. No decreased breath sounds, wheezing or rales.  Chest:     Chest wall: No tenderness.  Abdominal:     General: There is no distension.  Musculoskeletal:     Cervical back: Normal range of motion.     Right lower leg: No edema.     Left lower leg: No edema.  Skin:    General: Skin is warm and dry.     Nails: There is no clubbing.  Neurological:     Mental Status: She is alert and oriented to person, place, and time.  Psychiatric:        Speech: Speech normal.        Behavior: Behavior normal.        Thought Content: Thought content normal.        Judgment: Judgment normal.     Wt Readings from Last 3 Encounters:  04/10/22 127 lb 6.4 oz (57.8 kg)  03/08/22 126 lb (57.2  kg)  02/09/22 130 lb (59 kg)     ASSESSMENT & PLAN:   Paroxysmal SVT: Quiescent.  Most recent outpatient cardiac monitoring showed a decreased burden with no patient triggered events.  Unable to add standing or as needed AV nodal blocking medications secondary to underlying sinus bradycardia and brief, nonsustained asymptomatic episodes.  Previously evaluated by EP with atrial arrhythmia not felt to have contributed to her syncopal episode.  If symptoms become progressive, consider repeat evaluation with EP.  Valvular heart disease: Stable on echo in 12/2021.  Sinus bradycardia: Asymptomatic.  Not on AV nodal blocking medications.  She has demonstrated appropriate chronotropic competence in the office previously and on outpatient cardiac monitoring.  Recent Zio patch showed no evidence of high-grade AV block or prolonged pauses.  Continue to avoid AV nodal blocking medications.  No current indication for PPM.  Vasovagal syncope: No further episodes.  HTN: Blood pressure is reasonably controlled in the office today.  She remains on amlodipine, HCTZ, and losartan with recent renal function and electrolytes noted to be stable.  HLD: LDL 118 in 10/2019.  She remains on atorvastatin.   Disposition: F/u with Dr. Saunders Revel or an APP in 6 months.  Medication Adjustments/Labs and Tests Ordered: Current medicines are reviewed at length with the patient today.  Concerns regarding medicines are outlined above. Medication changes, Labs and Tests ordered today are summarized above and listed in the Patient Instructions accessible in Encounters.   Signed, [name], PA-C 04/10/2022 10:07 AM     [facility] [address] [place], [place] [address] [phone]

## 2022-04-06 NOTE — Telephone Encounter (Signed)
Spoke with patient and reviewed that we do not prescribe or refill that medication. She states her primary care provider retired and that [employer] took over. Lengthy discussion about her request for refill. Reviewed that we are a cardiology office and this request is not heart related. She then wanted to know what she should do. Encouraged her to reach out to her primary care provider with this request for refill. She verbalized understanding with no further questions at this time.

## 2022-04-10 ENCOUNTER — Ambulatory Visit: Payer: Medicare Other | Attending: Physician Assistant | Admitting: Physician Assistant

## 2022-04-10 ENCOUNTER — Encounter: Payer: Self-pay | Admitting: Physician Assistant

## 2022-04-10 VITALS — BP 140/70 | HR 53 | Ht 62.0 in | Wt 127.4 lb

## 2022-04-10 DIAGNOSIS — I38 Endocarditis, valve unspecified: Secondary | ICD-10-CM | POA: Diagnosis not present

## 2022-04-10 DIAGNOSIS — E785 Hyperlipidemia, unspecified: Secondary | ICD-10-CM

## 2022-04-10 DIAGNOSIS — R001 Bradycardia, unspecified: Secondary | ICD-10-CM | POA: Diagnosis not present

## 2022-04-10 DIAGNOSIS — I10 Essential (primary) hypertension: Secondary | ICD-10-CM

## 2022-04-10 DIAGNOSIS — I471 Supraventricular tachycardia, unspecified: Secondary | ICD-10-CM | POA: Diagnosis not present

## 2022-04-10 DIAGNOSIS — R55 Syncope and collapse: Secondary | ICD-10-CM

## 2022-04-10 MED ORDER — LOSARTAN POTASSIUM 100 MG PO TABS: 100.0000 mg | ORAL_TABLET | Freq: Every day | ORAL | 1 refills | Status: DC

## 2022-04-10 MED ORDER — AMLODIPINE BESYLATE 5 MG PO TABS: ORAL_TABLET | ORAL | 1 refills | Status: DC

## 2022-04-10 NOTE — Patient Instructions (Signed)
Medication Instructions:  No changes *If you need a refill on your cardiac medications before your next appointment, please call your pharmacy*   Lab Work: None ordered If you have labs (blood work) drawn today and your tests are completely normal, you will receive your results only by: [place] (if you have MyChart) OR A paper copy in the mail If you have any lab test that is abnormal or we need to change your treatment, we will call you to review the results.   Testing/Procedures: None ordered   Follow-Up: At [facility], you and your health needs are our priority.  As part of our continuing mission to provide you with exceptional heart care, we have created designated Provider Care Teams.  These Care Teams include your primary Cardiologist (physician) and Advanced Practice Providers (APPs -  Physician Assistants and Nurse Practitioners) who all work together to provide you with the care you need, when you need it.  We recommend signing up for the patient portal called "MyChart".  Sign up information is provided on this After Visit Summary.  MyChart is used to connect with patients for Virtual Visits (Telemedicine).  Patients are able to view lab/test results, encounter notes, upcoming appointments, etc.  Non-urgent messages can be sent to your provider as well.   To learn more about what you can do with MyChart, go to [email or web address].    Your next appointment:   6 month(s)  The format for your next appointment:   In Person  Provider:   You may see Nelva Bush, MD or one of the following Advanced Practice Providers on your designated Care Team:   Murray Hodgkins, NP [name], PA-C Cadence Kathlen Mody, PA-C Gerrie Nordmann, NP    Important Information About Sugar

## 2022-04-11 ENCOUNTER — Other Ambulatory Visit: Payer: Self-pay | Admitting: Nurse Practitioner

## 2022-04-11 DIAGNOSIS — E1165 Type 2 diabetes mellitus with hyperglycemia: Secondary | ICD-10-CM

## 2022-04-12 ENCOUNTER — Other Ambulatory Visit: Payer: Self-pay | Admitting: Physician Assistant

## 2022-04-21 ENCOUNTER — Encounter: Payer: Self-pay | Admitting: Nurse Practitioner

## 2022-04-25 DIAGNOSIS — H35352 Cystoid macular degeneration, left eye: Secondary | ICD-10-CM | POA: Diagnosis not present

## 2022-05-01 ENCOUNTER — Other Ambulatory Visit: Payer: Self-pay

## 2022-05-01 ENCOUNTER — Telehealth: Payer: Self-pay | Admitting: Nurse Practitioner

## 2022-05-01 DIAGNOSIS — E1165 Type 2 diabetes mellitus with hyperglycemia: Secondary | ICD-10-CM

## 2022-05-01 DIAGNOSIS — M1812 Unilateral primary osteoarthritis of first carpometacarpal joint, left hand: Secondary | ICD-10-CM | POA: Diagnosis not present

## 2022-05-01 DIAGNOSIS — M0579 Rheumatoid arthritis with rheumatoid factor of multiple sites without organ or systems involvement: Secondary | ICD-10-CM | POA: Diagnosis not present

## 2022-05-01 DIAGNOSIS — Z79899 Other long term (current) drug therapy: Secondary | ICD-10-CM | POA: Diagnosis not present

## 2022-05-01 DIAGNOSIS — M1811 Unilateral primary osteoarthritis of first carpometacarpal joint, right hand: Secondary | ICD-10-CM | POA: Diagnosis not present

## 2022-05-01 MED ORDER — PIOGLITAZONE HCL 15 MG PO TABS: 15.0000 mg | ORAL_TABLET | Freq: Every day | ORAL | 1 refills | Status: DC

## 2022-05-01 NOTE — Telephone Encounter (Signed)
Error

## 2022-05-15 ENCOUNTER — Other Ambulatory Visit: Payer: Self-pay | Admitting: Nurse Practitioner

## 2022-05-15 DIAGNOSIS — Z1231 Encounter for screening mammogram for malignant neoplasm of breast: Secondary | ICD-10-CM

## 2022-05-24 ENCOUNTER — Other Ambulatory Visit: Payer: Self-pay | Admitting: Physician Assistant

## 2022-05-30 ENCOUNTER — Ambulatory Visit
Admission: RE | Admit: 2022-05-30 | Discharge: 2022-05-30 | Disposition: A | Discharge status: Home / Self Care | Payer: Medicare Other | Source: Ambulatory Visit | Attending: Nurse Practitioner | Admitting: Nurse Practitioner

## 2022-05-30 DIAGNOSIS — Z1231 Encounter for screening mammogram for malignant neoplasm of breast: Secondary | ICD-10-CM | POA: Insufficient documentation

## 2022-05-30 DIAGNOSIS — K219 Gastro-esophageal reflux disease without esophagitis: Secondary | ICD-10-CM | POA: Diagnosis not present

## 2022-05-30 DIAGNOSIS — K5909 Other constipation: Secondary | ICD-10-CM | POA: Diagnosis not present

## 2022-05-30 DIAGNOSIS — M810 Age-related osteoporosis without current pathological fracture: Secondary | ICD-10-CM | POA: Diagnosis not present

## 2022-06-04 ENCOUNTER — Other Ambulatory Visit: Payer: Self-pay | Admitting: Nurse Practitioner

## 2022-06-04 DIAGNOSIS — E1165 Type 2 diabetes mellitus with hyperglycemia: Secondary | ICD-10-CM

## 2022-06-05 ENCOUNTER — Other Ambulatory Visit: Payer: Self-pay | Admitting: Physician Assistant

## 2022-06-07 ENCOUNTER — Ambulatory Visit: Payer: 59 | Admitting: Nurse Practitioner

## 2022-06-13 ENCOUNTER — Other Ambulatory Visit: Payer: Self-pay | Admitting: Nurse Practitioner

## 2022-06-13 DIAGNOSIS — E1165 Type 2 diabetes mellitus with hyperglycemia: Secondary | ICD-10-CM

## 2022-06-14 ENCOUNTER — Other Ambulatory Visit: Payer: Self-pay | Admitting: Physician Assistant

## 2022-06-14 DIAGNOSIS — I10 Essential (primary) hypertension: Secondary | ICD-10-CM

## 2022-06-27 ENCOUNTER — Ambulatory Visit (INDEPENDENT_AMBULATORY_CARE_PROVIDER_SITE_OTHER): Payer: 59 | Admitting: Nurse Practitioner

## 2022-06-27 ENCOUNTER — Encounter: Payer: Self-pay | Admitting: Nurse Practitioner

## 2022-06-27 VITALS — BP 139/69 | HR 52 | Temp 96.9°F | Resp 16 | Ht 62.0 in | Wt 127.4 lb

## 2022-06-27 DIAGNOSIS — I10 Essential (primary) hypertension: Secondary | ICD-10-CM

## 2022-06-27 DIAGNOSIS — E1165 Type 2 diabetes mellitus with hyperglycemia: Secondary | ICD-10-CM

## 2022-06-27 DIAGNOSIS — M810 Age-related osteoporosis without current pathological fracture: Secondary | ICD-10-CM | POA: Diagnosis not present

## 2022-06-27 LAB — POCT GLYCOSYLATED HEMOGLOBIN (HGB A1C): Hemoglobin A1C: 6.5 % — AB (ref 4.0–5.6)

## 2022-06-27 MED ORDER — ONETOUCH DELICA PLUS LANCET33G MISC: 3 refills | Status: DC

## 2022-06-27 MED ORDER — ONETOUCH VERIO VI STRP: ORAL_STRIP | 3 refills | Status: DC

## 2022-06-27 MED ORDER — ALENDRONATE SODIUM 70 MG PO TABS: 70.0000 mg | ORAL_TABLET | ORAL | 1 refills | Status: DC

## 2022-06-27 NOTE — Progress Notes (Signed)
[facility] [place], [place] [address]  Internal MEDICINE  Office Visit Note  Patient Name: [name]  [id number]  [id number]  Date of Service: 06/27/2022  Chief Complaint  Patient presents with   Hyperlipidemia   Gastroesophageal Reflux   Diabetes   Hypertension    HPI Wren presents for a follow-up visit for diabetes, osteoporosis, hypertension Diabetes -- A1c improved to 6.5. new glucose meter needed, given in office today  2. Osteoporosis - fosamax is recommended  3. Blood pressure is good   Current Medication: Outpatient Encounter Medications as of 06/27/2022  Medication Sig   acetaminophen (TYLENOL) 500 MG tablet Take 500-1,000 mg by mouth every 6 (six) hours as needed.   Alcohol Swabs (ALCOHOL PREP) PADS    alendronate (FOSAMAX) 70 MG tablet Take 1 tablet (70 mg total) by mouth once a week. Take with a full glass of water on an empty stomach.   amLODipine (NORVASC) 5 MG tablet TAKE 1 TABLET BY MOUTH IN THE  EVENING   aspirin EC 81 MG tablet Take 1 tablet (81 mg total) by mouth daily.   atorvastatin (LIPITOR) 10 MG tablet TAKE 1 TABLET BY MOUTH AT  BEDTIME FOR HIGH  CHOLESTEROL   COMBIGAN 0.2-0.5 % ophthalmic solution Apply 1 drop to eye 2 (two) times daily. Left eye   cyanocobalamin (VITAMIN B12) 1000 MCG tablet Take 1 tablet (1,000 mcg total) by mouth daily.   Ferrous Gluconate (IRON 27 PO) Take 27 mg by mouth daily.    folic acid (FOLVITE) 1 MG tablet TAKE 2 TABLETS BY MOUTH  DAILY   glimepiride (AMARYL) 4 MG tablet TAKE ONE-HALF TABLET BY MOUTH  WITH BREAKFAST AND ONE-HALF  TABLET BY MOUTH WITH DINNER   hydrochlorothiazide (HYDRODIURIL) 25 MG tablet Take 25 mg by mouth daily.   Lancets (ONETOUCH DELICA PLUS [id number]) MISC Use 1 lancet twice daily to check glucose with glucose meter.   losartan (COZAAR) 100 MG tablet Take 1 tablet (100 mg total) by mouth daily.   methotrexate 2.5 MG tablet Take 15 mg by mouth once a week.     pantoprazole (PROTONIX) 40 MG tablet TAKE 1 TABLET BY MOUTH DAILY FOR REFLUX   pioglitazone (ACTOS) 15 MG tablet TAKE 1 TABLET(15 MG) BY MOUTH DAILY IN THE MORNING   traMADol (ULTRAM) 50 MG tablet Take 0.5-1 tablets (25-50 mg total) by mouth every 6 (six) hours as needed for severe pain.   [DISCONTINUED] glucose blood (ONETOUCH VERIO) test strip USE AS DIRECTED TWICE DAILY   glucose blood (ONETOUCH VERIO) test strip Use 1 test strip TWICE DAILY to check glucose with glucose meter.   No facility-administered encounter medications on file as of 06/27/2022.    Surgical History: Past Surgical History:  Procedure Laterality Date   ABDOMINAL HYSTERECTOMY     ANTERIOR VITRECTOMY Left 03/10/2020   Procedure: ANTERIOR VITRECTOMY;  Surgeon: Leandrew Koyanagi, MD;  Location: [place];  Service: Ophthalmology;  Laterality: Left;   CATARACT EXTRACTION     CATARACT EXTRACTION W/PHACO Left 03/10/2020   Procedure: CATARACT EXTRACTION PHACO  (IOC) LEFT  DIABETIC 16.27  02:09.7  12.5%;  Surgeon: Leandrew Koyanagi, MD;  Location: [place];  Service: Ophthalmology;  Laterality: Left;  Diabetic - oral meds   COLONOSCOPY WITH PROPOFOL N/A 10/25/2017   Procedure: COLONOSCOPY WITH PROPOFOL;  Surgeon: Lollie Sails, MD;  Location[facility] ENDOSCOPY;  Service: Endoscopy;  Laterality: N/A;   ESOPHAGOGASTRODUODENOSCOPY (EGD) WITH PROPOFOL N/A 10/25/2017   Procedure: ESOPHAGOGASTRODUODENOSCOPY (  EGD) WITH PROPOFOL;  Surgeon: Lollie Sails, MD;  Location: [facility] ENDOSCOPY;  Service: Endoscopy;  Laterality: N/A;   ESOPHAGOGASTRODUODENOSCOPY (EGD) WITH PROPOFOL N/A 03/04/2019   Procedure: ESOPHAGOGASTRODUODENOSCOPY (EGD) WITH PROPOFOL;  Surgeon: Lollie Sails, MD;  Location: [facility] ENDOSCOPY;  Service: Endoscopy;  Laterality: N/A;   EUS N/A 01/17/2018   Procedure: ESOPHAGEAL ENDOSCOPIC ULTRASOUND (EUS) RADIAL;  Surgeon: Reita Cliche, MD;  Location: [facility] ENDOSCOPY;  Service:  Gastroenterology;  Laterality: N/A;   right side had fatty tissue taken off     TONSILLECTOMY      Medical History: Past Medical History:  Diagnosis Date   Anemia    Arthritis    Diabetes mellitus without complication (HCC)    GERD (gastroesophageal reflux disease)    Heart murmur    Hyperlipidemia    Hypertension    Leaky heart valve    Rheumatoid arthritis (HCC)    Wears dentures     Family History: Family History  Problem Relation Age of Onset   Hypertension Mother    Diabetes Father    Heart disease Brother 73       Open heart surgery (details unknown)   Breast cancer Neg Hx     Social History   Socioeconomic History   Marital status: Widowed    Spouse name: Not on file   Number of children: Not on file   Years of education: Not on file   Highest education level: Not on file  Occupational History   Not on file  Tobacco Use   Smoking status: Never   Smokeless tobacco: Never  Vaping Use   Vaping Use: Never used  Substance and Sexual Activity   Alcohol use: No   Drug use: No   Sexual activity: Not on file  Other Topics Concern   Not on file  Social History Narrative   Not on file   Social Determinants of Health   Financial Resource Strain: Not on file  Food Insecurity: Not on file  Transportation Needs: Not on file  Physical Activity: Not on file  Stress: Not on file  Social Connections: Not on file  Intimate Partner Violence: Not on file      Review of Systems  Constitutional:  Negative for chills, fatigue and unexpected weight change.  HENT:  Positive for postnasal drip. Negative for congestion, rhinorrhea, sneezing and sore throat.   Eyes:  Negative for redness.  Respiratory:  Negative for cough, chest tightness and shortness of breath.   Cardiovascular:  Negative for chest pain and palpitations.  Gastrointestinal:  Negative for abdominal pain, constipation, diarrhea, nausea and vomiting.  Genitourinary:  Negative for dysuria and  frequency.  Musculoskeletal:  Negative for arthralgias, back pain, joint swelling and neck pain.  Skin:  Negative for rash.  Neurological: Negative.  Negative for tremors and numbness.  Hematological:  Negative for adenopathy. Does not bruise/bleed easily.  Psychiatric/Behavioral:  Negative for behavioral problems (Depression), sleep disturbance and suicidal ideas. The patient is not nervous/anxious.     Vital Signs: BP 139/69   Pulse (!) 52   Temp (!) 96.9 F (36.1 C)   Resp 16   Ht '5\' 2"'$  (1.575 m)   Wt 127 lb 6.4 oz (57.8 kg)   SpO2 96%   BMI 23.30 kg/m    Physical Exam Vitals reviewed.  Constitutional:      General: She is not in acute distress.    Appearance: Normal appearance. She is normal weight. She is not ill-appearing.  HENT:  Head: Normocephalic and atraumatic.  Eyes:     Pupils: Pupils are equal, round, and reactive to light.  Cardiovascular:     Rate and Rhythm: Normal rate and regular rhythm.  Pulmonary:     Effort: Pulmonary effort is normal. No respiratory distress.  Neurological:     Mental Status: She is alert and oriented to person, place, and time.  Psychiatric:        Mood and Affect: Mood normal.        Behavior: Behavior normal.        Assessment/Plan: 1. Type 2 diabetes mellitus with hyperglycemia, without long-term current use of insulin (HCC) A1c is improved to 6.5, new glucose meter provided to patient in office and test strips and lancets were ordered.  - POCT glycosylated hemoglobin (Hb A1C) - glucose blood (ONETOUCH VERIO) test strip; Use 1 test strip TWICE DAILY to check glucose with glucose meter.  Dispense: 200 strip; Refill: 3 - Lancets (ONETOUCH DELICA PLUS [id number]) MISC; Use 1 lancet twice daily to check glucose with glucose meter.  Dispense: 200 each; Refill: 3  2. Essential hypertension Stable, continue medications as prescribed  3. Age-related osteoporosis without current pathological fracture Start alendronate for  osteoporosis to reduce risk of fracture.  - alendronate (FOSAMAX) 70 MG tablet; Take 1 tablet (70 mg total) by mouth once a week. Take with a full glass of water on an empty stomach.  Dispense: 12 tablet; Refill: 1   General Counseling: Iretha verbalizes understanding of the findings of todays visit and agrees with plan of treatment. I have discussed any further diagnostic evaluation that may be needed or ordered today. We also reviewed her medications today. she has been encouraged to call the office with any questions or concerns that should arise related to todays visit.    Orders Placed This Encounter  Procedures   POCT glycosylated hemoglobin (Hb A1C)    Meds ordered this encounter  Medications   alendronate (FOSAMAX) 70 MG tablet    Sig: Take 1 tablet (70 mg total) by mouth once a week. Take with a full glass of water on an empty stomach.    Dispense:  12 tablet    Refill:  1   glucose blood (ONETOUCH VERIO) test strip    Sig: Use 1 test strip TWICE DAILY to check glucose with glucose meter.    Dispense:  200 strip    Refill:  3    Dx code E11.65   Lancets (ONETOUCH DELICA PLUS [id number]) MISC    Sig: Use 1 lancet twice daily to check glucose with glucose meter.    Dispense:  200 each    Refill:  3    Dx code E11.65    Return in about 3 months (around 10/02/2022) for F/U, Recheck A1C, Hayward Rylander PCP.   Total time spent:30 Minutes Time spent includes review of chart, medications, test results, and follow up plan with the patient.   [place] Controlled Substance Database was reviewed by me.  This patient was seen by Jonetta Osgood, FNP-C in collaboration with Dr. Clayborn Bigness as a part of collaborative care agreement.   [place] R. Valetta Fuller, MSN, FNP-C Internal medicine

## 2022-06-30 ENCOUNTER — Encounter: Payer: Self-pay | Admitting: Nurse Practitioner

## 2022-07-02 ENCOUNTER — Other Ambulatory Visit: Payer: Self-pay | Admitting: Physician Assistant

## 2022-07-03 ENCOUNTER — Other Ambulatory Visit: Payer: Self-pay

## 2022-07-03 DIAGNOSIS — H35352 Cystoid macular degeneration, left eye: Secondary | ICD-10-CM | POA: Diagnosis not present

## 2022-07-03 MED ORDER — ONETOUCH VERIO FLEX SYSTEM W/DEVICE KIT: PACK | 0 refills | Status: DC

## 2022-07-12 DIAGNOSIS — M2042 Other hammer toe(s) (acquired), left foot: Secondary | ICD-10-CM | POA: Diagnosis not present

## 2022-07-12 DIAGNOSIS — M79675 Pain in left toe(s): Secondary | ICD-10-CM | POA: Diagnosis not present

## 2022-07-12 DIAGNOSIS — E119 Type 2 diabetes mellitus without complications: Secondary | ICD-10-CM | POA: Diagnosis not present

## 2022-07-12 DIAGNOSIS — B351 Tinea unguium: Secondary | ICD-10-CM | POA: Diagnosis not present

## 2022-07-12 DIAGNOSIS — M2041 Other hammer toe(s) (acquired), right foot: Secondary | ICD-10-CM | POA: Diagnosis not present

## 2022-07-12 DIAGNOSIS — M79674 Pain in right toe(s): Secondary | ICD-10-CM | POA: Diagnosis not present

## 2022-07-18 ENCOUNTER — Other Ambulatory Visit: Payer: Self-pay | Admitting: Nurse Practitioner

## 2022-07-18 DIAGNOSIS — E1165 Type 2 diabetes mellitus with hyperglycemia: Secondary | ICD-10-CM

## 2022-08-31 ENCOUNTER — Other Ambulatory Visit: Payer: Self-pay | Admitting: Nurse Practitioner

## 2022-08-31 ENCOUNTER — Other Ambulatory Visit: Payer: Self-pay | Admitting: Physician Assistant

## 2022-08-31 DIAGNOSIS — I10 Essential (primary) hypertension: Secondary | ICD-10-CM

## 2022-08-31 DIAGNOSIS — E559 Vitamin D deficiency, unspecified: Secondary | ICD-10-CM

## 2022-09-01 ENCOUNTER — Other Ambulatory Visit: Payer: Self-pay

## 2022-09-01 MED ORDER — ATORVASTATIN CALCIUM 10 MG PO TABS: ORAL_TABLET | ORAL | 1 refills | Status: DC

## 2022-09-01 MED ORDER — HYDROCHLOROTHIAZIDE 25 MG PO TABS: 25.0000 mg | ORAL_TABLET | Freq: Every day | ORAL | 1 refills | Status: DC

## 2022-09-25 DIAGNOSIS — H35352 Cystoid macular degeneration, left eye: Secondary | ICD-10-CM | POA: Diagnosis not present

## 2022-09-25 DIAGNOSIS — H35351 Cystoid macular degeneration, right eye: Secondary | ICD-10-CM | POA: Diagnosis not present

## 2022-09-27 ENCOUNTER — Encounter: Payer: Self-pay | Admitting: Nurse Practitioner

## 2022-09-27 ENCOUNTER — Ambulatory Visit (INDEPENDENT_AMBULATORY_CARE_PROVIDER_SITE_OTHER): Payer: 59 | Admitting: Nurse Practitioner

## 2022-09-27 VITALS — BP 130/64 | HR 60 | Temp 98.3°F | Resp 16 | Ht 62.0 in | Wt 124.6 lb

## 2022-09-27 DIAGNOSIS — H539 Unspecified visual disturbance: Secondary | ICD-10-CM | POA: Diagnosis not present

## 2022-09-27 DIAGNOSIS — E1165 Type 2 diabetes mellitus with hyperglycemia: Secondary | ICD-10-CM

## 2022-09-27 DIAGNOSIS — I10 Essential (primary) hypertension: Secondary | ICD-10-CM

## 2022-09-27 DIAGNOSIS — Z79899 Other long term (current) drug therapy: Secondary | ICD-10-CM | POA: Diagnosis not present

## 2022-09-27 DIAGNOSIS — M0579 Rheumatoid arthritis with rheumatoid factor of multiple sites without organ or systems involvement: Secondary | ICD-10-CM | POA: Diagnosis not present

## 2022-09-27 LAB — POCT GLYCOSYLATED HEMOGLOBIN (HGB A1C): Hemoglobin A1C: 6.3 % — AB (ref 4.0–5.6)

## 2022-09-27 NOTE — Progress Notes (Signed)
[facility] [address] [place] [employer] [place], [place] [address]  Internal MEDICINE  Office Visit Note  Patient Name: [name]  [id number]  [id number]  Date of Service: 09/27/2022  Chief Complaint  Patient presents with   Depression   Gastroesophageal Reflux   Hypertension   Hyperlipidemia   Follow-up    HPI [name] presents for a follow-up visit for diabetes, blurry vision and hypertension Diabetes -- A1c is 6.3 today which is improved from 6.5 in January.  Blurry vision -- on a new eye medication -- prednisone drops.  Hypertension -- controlled with current medications.  Medication list reviewed and updated.     Current Medication: Outpatient Encounter Medications as of 09/27/2022  Medication Sig   acetaminophen (TYLENOL) 500 MG tablet Take 500-1,000 mg by mouth every 6 (six) hours as needed.   Alcohol Swabs (ALCOHOL PREP) PADS    alendronate (FOSAMAX) 70 MG tablet Take 1 tablet (70 mg total) by mouth once a week. Take with a full glass of water on an empty stomach.   amLODipine (NORVASC) 5 MG tablet TAKE 1 TABLET BY MOUTH IN THE  EVENING   aspirin EC 81 MG tablet Take 1 tablet (81 mg total) by mouth daily.   atorvastatin (LIPITOR) 10 MG tablet TAKE 1 TABLET BY MOUTH AT  BEDTIME FOR HIGH  CHOLESTEROL   Blood Glucose Monitoring Suppl (ONETOUCH VERIO FLEX SYSTEM) w/Device KIT Use as directed Dxb E11.65   COMBIGAN 0.2-0.5 % ophthalmic solution Apply 1 drop to eye 2 (two) times daily. Left eye   cyanocobalamin (VITAMIN B12) 1000 MCG tablet Take 1 tablet (1,000 mcg total) by mouth daily.   Ferrous Gluconate (IRON 27 PO) Take 27 mg by mouth daily.    folic acid (FOLVITE) 1 MG tablet TAKE 2 TABLETS BY MOUTH  DAILY   glimepiride (AMARYL) 4 MG tablet TAKE ONE-HALF TABLET BY MOUTH  WITH BREAKFAST AND ONE-HALF  TABLET BY MOUTH WITH DINNER   glucose blood (ONETOUCH VERIO) test strip Use 1 test strip TWICE DAILY to check glucose with glucose meter.   hydrochlorothiazide  (HYDRODIURIL) 25 MG tablet Take 1 tablet (25 mg total) by mouth daily.   Lancets (ONETOUCH DELICA PLUS [id number]) MISC Use 1 lancet twice daily to check glucose with glucose meter.   losartan (COZAAR) 100 MG tablet TAKE 1 TABLET BY MOUTH DAILY   methotrexate 2.5 MG tablet Take 15 mg by mouth once a week.    pantoprazole (PROTONIX) 40 MG tablet TAKE 1 TABLET BY MOUTH DAILY FOR REFLUX   pioglitazone (ACTOS) 15 MG tablet TAKE 1 TABLET BY MOUTH DAILY IN  THE MORNING   prednisoLONE acetate (PRED FORTE) 1 % ophthalmic suspension Place 1 drop into the right eye 4 (four) times daily.   traMADol (ULTRAM) 50 MG tablet Take 0.5-1 tablets (25-50 mg total) by mouth every 6 (six) hours as needed for severe pain.   No facility-administered encounter medications on file as of 09/27/2022.    Surgical History: Past Surgical History:  Procedure Laterality Date   ABDOMINAL HYSTERECTOMY     ANTERIOR VITRECTOMY Left 03/10/2020   Procedure: ANTERIOR VITRECTOMY;  Surgeon: Lockie Mola, MD;  Location: [facility];  Service: Ophthalmology;  Laterality: Left;   CATARACT EXTRACTION     CATARACT EXTRACTION W/PHACO Left 03/10/2020   Procedure: CATARACT EXTRACTION PHACO  (IOC) LEFT  DIABETIC 16.27  02:09.7  12.5%;  Surgeon: Lockie Mola, MD;  Location: [facility];  Service: Ophthalmology;  Laterality: Left;  Diabetic - oral  meds   COLONOSCOPY WITH PROPOFOL N/A 10/25/2017   Procedure: COLONOSCOPY WITH PROPOFOL;  Surgeon: Christena Deem, MD;  Location: [facility] ENDOSCOPY;  Service: Endoscopy;  Laterality: N/A;   ESOPHAGOGASTRODUODENOSCOPY (EGD) WITH PROPOFOL N/A 10/25/2017   Procedure: ESOPHAGOGASTRODUODENOSCOPY (EGD) WITH PROPOFOL;  Surgeon: Christena Deem, MD;  Location: [facility] ENDOSCOPY;  Service: Endoscopy;  Laterality: N/A;   ESOPHAGOGASTRODUODENOSCOPY (EGD) WITH PROPOFOL N/A 03/04/2019   Procedure: ESOPHAGOGASTRODUODENOSCOPY (EGD) WITH PROPOFOL;  Surgeon: Christena Deem, MD;   Location: [facility] ENDOSCOPY;  Service: Endoscopy;  Laterality: N/A;   EUS N/A 01/17/2018   Procedure: ESOPHAGEAL ENDOSCOPIC ULTRASOUND (EUS) RADIAL;  Surgeon: Doren Custard, MD;  Location: [facility] ENDOSCOPY;  Service: Gastroenterology;  Laterality: N/A;   right side had fatty tissue taken off     TONSILLECTOMY      Medical History: Past Medical History:  Diagnosis Date   Anemia    Arthritis    Diabetes mellitus without complication (HCC)    GERD (gastroesophageal reflux disease)    Heart murmur    Hyperlipidemia    Hypertension    Leaky heart valve    Rheumatoid arthritis (HCC)    Wears dentures     Family History: Family History  Problem Relation Age of Onset   Hypertension Mother    Diabetes Father    Heart disease Brother 46       Open heart surgery (details unknown)   Breast cancer Neg Hx     Social History   Socioeconomic History   Marital status: Widowed    Spouse name: Not on file   Number of children: Not on file   Years of education: Not on file   Highest education level: Not on file  Occupational History   Not on file  Tobacco Use   Smoking status: Never   Smokeless tobacco: Never  Vaping Use   Vaping Use: Never used  Substance and Sexual Activity   Alcohol use: No   Drug use: No   Sexual activity: Not on file  Other Topics Concern   Not on file  Social History Narrative   Not on file   Social Determinants of Health   Financial Resource Strain: Not on file  Food Insecurity: Not on file  Transportation Needs: Not on file  Physical Activity: Not on file  Stress: Not on file  Social Connections: Not on file  Intimate Partner Violence: Not on file      Review of Systems  Constitutional:  Negative for chills, fatigue and unexpected weight change.  HENT:  Positive for postnasal drip. Negative for congestion, rhinorrhea, sneezing and sore throat.   Eyes:  Positive for visual disturbance. Negative for redness.  Respiratory:  Negative for cough,  chest tightness and shortness of breath.   Cardiovascular:  Negative for chest pain and palpitations.  Gastrointestinal:  Negative for abdominal pain, constipation, diarrhea, nausea and vomiting.  Genitourinary:  Negative for dysuria and frequency.  Musculoskeletal:  Negative for arthralgias, back pain, joint swelling and neck pain.  Skin:  Negative for rash.  Neurological: Negative.  Negative for tremors and numbness.  Hematological:  Negative for adenopathy. Does not bruise/bleed easily.  Psychiatric/Behavioral:  Negative for behavioral problems (Depression), sleep disturbance and suicidal ideas. The patient is not nervous/anxious.     Vital Signs: BP 130/64   Pulse 60   Temp 98.3 F (36.8 C)   Resp 16   Ht 5\' 2"  (1.575 m)   Wt 124 lb 9.6 oz (56.5 kg)  SpO2 98%   BMI 22.79 kg/m    Physical Exam Vitals reviewed.  Constitutional:      General: She is not in acute distress.    Appearance: Normal appearance. She is normal weight. She is not ill-appearing.  HENT:     Head: Normocephalic and atraumatic.  Eyes:     Pupils: Pupils are equal, round, and reactive to light.  Cardiovascular:     Rate and Rhythm: Normal rate and regular rhythm.  Pulmonary:     Effort: Pulmonary effort is normal. No respiratory distress.  Neurological:     Mental Status: She is alert and oriented to person, place, and time.  Psychiatric:        Mood and Affect: Mood normal.        Behavior: Behavior normal.        Assessment/Plan: 1. Type 2 diabetes mellitus with hyperglycemia, without long-term current use of insulin (HCC) A1c is stable, continue medications as prescribed.  - POCT glycosylated hemoglobin (Hb A1C)  2. Essential hypertension Continue current medications as prescribed.  3. Visual disturbance Continue to follow directions from eye doctor - prednisoLONE acetate (PRED FORTE) 1 % ophthalmic suspension; Place 1 drop into the right eye 4 (four) times daily.   General  Counseling: Pattie verbalizes understanding of the findings of todays visit and agrees with plan of treatment. I have discussed any further diagnostic evaluation that may be needed or ordered today. We also reviewed her medications today. she has been encouraged to call the office with any questions or concerns that should arise related to todays visit.    Orders Placed This Encounter  Procedures   POCT glycosylated hemoglobin (Hb A1C)    No orders of the defined types were placed in this encounter.   Return for previously scheduled, CPE, Mahmoud Blazejewski PCP in june.   Total time spent:30 Minutes Time spent includes review of chart, medications, test results, and follow up plan with the patient.   [place] Controlled Substance Database was reviewed by me.  This patient was seen by Sallyanne Kuster, FNP-C in collaboration with Dr. Beverely Risen as a part of collaborative care agreement.   Skylee Baird R. Tedd Sias, MSN, FNP-C Internal medicine

## 2022-10-02 ENCOUNTER — Ambulatory Visit: Payer: 59 | Admitting: Nurse Practitioner

## 2022-10-11 ENCOUNTER — Ambulatory Visit: Payer: Medicare Other | Admitting: Internal Medicine

## 2022-10-12 NOTE — Progress Notes (Signed)
Cardiology Office Note    Date:  10/17/2022   ID:  [name], DOB [birth year], MRN [id number]  PCP:  Sallyanne Kuster, NP  Cardiologist:  Yvonne Kendall, MD  Electrophysiologist:  Lanier Prude, MD   Chief Complaint: Follow up  History of Present Illness:   [name] is a 82 y.o. female with history of pSVT, valvular heart disease including mild to moderate mitral regurgitation, aortic insufficiency, tricuspid regurgitation, and mild aortic stenosis, vasovagal syncope, DM2, HTN, HLD, RA, and anemia who presents for follow-up of PSVT and valvular heart disease.    Prior echo, obtained through outside office, showed a hyperdynamic LVSF with an EF > 70%, moderate LVH, small left ventricle, normal RVSF and ventricular cavity size, mild AS, moderate AI, mild MR, moderate TR, mild pulmonary hypertension. She has since been followed at [facility], noting stable exertional dyspnea and sporadic "twitches" of randomly occurring chest pain.   She underwent echo in 02/2020 for follow-up of her valvular heart disease which demonstrated an EF of 60 to 65%, no regional wall motion abnormalities, grade 1 diastolic dysfunction, mild LVH, normal RV systolic function and ventricular cavity size, mildly elevated PASP at 42.2 mmHg, mild mitral regurgitation, mild aortic valve insufficiency, mild to moderate aortic valve sclerosis without evidence of stenosis.  When compared to prior echo, her valvular heart disease was stable and her right-sided pressure was improved.  She was seen in the office in 04/2020 for follow-up of hypertension at which time she was doing relatively well.  She was tolerating the increased dose of losartan.  She did continue to note intermittent palpitations that would last a second or 2 without associated symptoms.  It was recommended to possibly discontinue hydralazine in follow-up for ease of dosing her medications if her blood pressure remained well controlled.   Following this visit, there was some confusion regarding her HCTZ as it appeared this was discontinued in late 04/2020 though she continued to take this medication.  She was seen in 06/2020 and reported a syncopal episode in late 04/2020.  Outpatient cardiac monitoring demonstrated a predominant rhythm of sinus with an average rate of 66 bpm (range 47 to 120 bpm in sinus), a single 7 beat run of NSVT occurred with a maximum rate of 152 bpm, 157 episodes of SVT lasting up to 26.5 seconds with a maximum rate of 207 bpm, occasional PACs with a 1.8% burden, rare PVCs, and no patient triggered events.  She was subsequently evaluated by EP for her episode of syncope and frequent episodes of SVT noted on outpatient cardiac monitoring with noted inability to take a beta-blocker due to resting bradycardia.  EP felt her syncopal episode was consistent with vasovagal etiology.  With regards to her SVT, pharmacologic therapy was deferred given that she was asymptomatic with these runs, and in the context of resting bradycardia.  SVT was not felt to have contributed to her syncopal episode.  She was seen in the office in 07/2021 and did note occasional palpitations, mostly when stressed.  She was without further episodes of syncope.  She was bradycardic with a heart rate of 51 and a BP of 90/50, though asymptomatic.  She underwent outpatient cardiac monitoring which demonstrated a predominant rhythm of sinus with an average rate of 57 bpm (range 42 to 106 bpm in sinus), occasional PACs with a 1.1% burden, rare PVCs, 1 episode of NSVT lasting 6 beats with a maximum rate of 116 bpm, 109 atrial runs lasting  up to 14.7 seconds with a maximum rate of 193 bpm.  No sustained arrhythmias or prolonged pauses.  No patient triggered events.     She was admitted to the hospital in 12/2021 with generalized weakness and hypertensive urgency.  High-sensitivity troponin normal.  Echo showed an EF of 60 to 65%, no regional wall motion  abnormalities, grade 1 diastolic dysfunction, normal RV systolic function and ventricular cavity size, mildly elevated PASP estimated at 42.9 mmHg, moderately dilated left atrium, mild mitral regurgitation with moderate mitral annular calcification, mild aortic insufficiency, and an estimated right atrial pressure of 3 mmHg.  MRI of the brain did not show evidence of acute stroke or abnormality.  Following the above admission, she was seen in the ED after sustaining a mechanical fall with a closed compression fracture of L1.  She did not hit her head or suffer LOC.  No syncope.  She was last seen in the office in 03/2022 and was without symptoms of angina or cardiac decompensation.  She was without dizziness, presyncope, or syncope.  She continued to deal with back pain following her compression fracture.  She comes in doing well from a cardiac perspective and is without symptoms of angina or cardiac decompensation.  She notes a rare fleeting palpitations that will last several seconds at nighttime with spontaneous resolution.  No dizziness, presyncope, or syncope.  No lower extremity swelling or progressive orthopnea.  Blood pressure at home is typically in the 130s systolic.  She is adherent and tolerating cardiac medications without issues.   Labs independently reviewed: 09/2022 - Hgb 10.6, PLT 178, albumin 4.0, AST/ALT normal, serum creatinine 1.1, A1c 6.3 12/2021 - BUN 28, serum creatinine 1.05 12/2021 - potassium 4.0, TSH normal 10/2019 - TC 207, TG 49, HDL 79, LDL 118   Past Medical History:  Diagnosis Date   Anemia    Arthritis    Diabetes mellitus without complication (HCC)    GERD (gastroesophageal reflux disease)    Heart murmur    Hyperlipidemia    Hypertension    Leaky heart valve    Rheumatoid arthritis (HCC)    Wears dentures     Past Surgical History:  Procedure Laterality Date   ABDOMINAL HYSTERECTOMY     ANTERIOR VITRECTOMY Left 03/10/2020   Procedure: ANTERIOR VITRECTOMY;   Surgeon: Lockie Mola, MD;  Location: [facility];  Service: Ophthalmology;  Laterality: Left;   CATARACT EXTRACTION     CATARACT EXTRACTION W/PHACO Left 03/10/2020   Procedure: CATARACT EXTRACTION PHACO  (IOC) LEFT  DIABETIC 16.27  02:09.7  12.5%;  Surgeon: Lockie Mola, MD;  Location: [facility];  Service: Ophthalmology;  Laterality: Left;  Diabetic - oral meds   COLONOSCOPY WITH PROPOFOL N/A 10/25/2017   Procedure: COLONOSCOPY WITH PROPOFOL;  Surgeon: Christena Deem, MD;  Location: [facility];  Service: Endoscopy;  Laterality: N/A;   ESOPHAGOGASTRODUODENOSCOPY (EGD) WITH PROPOFOL N/A 10/25/2017   Procedure: ESOPHAGOGASTRODUODENOSCOPY (EGD) WITH PROPOFOL;  Surgeon: Christena Deem, MD;  Location: [facility];  Service: Endoscopy;  Laterality: N/A;   ESOPHAGOGASTRODUODENOSCOPY (EGD) WITH PROPOFOL N/A 03/04/2019   Procedure: ESOPHAGOGASTRODUODENOSCOPY (EGD) WITH PROPOFOL;  Surgeon: Christena Deem, MD;  Location: [facility] ENDOSCOPY;  Service: Endoscopy;  Laterality: N/A;   EUS N/A 01/17/2018   Procedure: ESOPHAGEAL ENDOSCOPIC ULTRASOUND (EUS) RADIAL;  Surgeon: Doren Custard, MD;  Location: [facility] ENDOSCOPY;  Service: Gastroenterology;  Laterality: N/A;   right side had fatty tissue taken off     TONSILLECTOMY  Current Medications: Current Meds  Medication Sig   acetaminophen (TYLENOL) 500 MG tablet Take 500-1,000 mg by mouth every 6 (six) hours as needed.   Alcohol Swabs (ALCOHOL PREP) PADS    amLODipine (NORVASC) 5 MG tablet TAKE 1 TABLET BY MOUTH IN THE  EVENING   aspirin EC 81 MG tablet Take 1 tablet (81 mg total) by mouth daily.   atorvastatin (LIPITOR) 10 MG tablet TAKE 1 TABLET BY MOUTH AT  BEDTIME FOR HIGH  CHOLESTEROL   Blood Glucose Monitoring Suppl (ONETOUCH VERIO FLEX SYSTEM) w/Device KIT Use as directed Dxb E11.65   COMBIGAN 0.2-0.5 % ophthalmic solution Apply 1 drop to eye 2 (two) times daily. Left eye   cyanocobalamin (VITAMIN  B12) 1000 MCG tablet Take 1 tablet (1,000 mcg total) by mouth daily.   Ferrous Sulfate (IRON) 325 (65 Fe) MG TABS Take 1 tablet by mouth daily.   folic acid (FOLVITE) 1 MG tablet TAKE 2 TABLETS BY MOUTH  DAILY   glimepiride (AMARYL) 4 MG tablet TAKE ONE-HALF TABLET BY MOUTH  WITH BREAKFAST AND ONE-HALF  TABLET BY MOUTH WITH DINNER   glucose blood (ONETOUCH VERIO) test strip Use 1 test strip TWICE DAILY to check glucose with glucose meter.   hydrochlorothiazide (HYDRODIURIL) 25 MG tablet Take 1 tablet (25 mg total) by mouth daily.   Lancets (ONETOUCH DELICA PLUS LANCET33G) MISC Use 1 lancet twice daily to check glucose with glucose meter.   losartan (COZAAR) 100 MG tablet TAKE 1 TABLET BY MOUTH DAILY   pantoprazole (PROTONIX) 40 MG tablet TAKE 1 TABLET BY MOUTH DAILY FOR REFLUX   pioglitazone (ACTOS) 15 MG tablet TAKE 1 TABLET BY MOUTH DAILY IN  THE MORNING   prednisoLONE acetate (PRED FORTE) 1 % ophthalmic suspension Place 1 drop into the right eye 4 (four) times daily.    Allergies:   Patient has no known allergies.   Social History   Socioeconomic History   Marital status: Widowed    Spouse name: Not on file   Number of children: Not on file   Years of education: Not on file   Highest education level: Not on file  Occupational History   Not on file  Tobacco Use   Smoking status: Never   Smokeless tobacco: Never  Vaping Use   Vaping Use: Never used  Substance and Sexual Activity   Alcohol use: No   Drug use: No   Sexual activity: Not on file  Other Topics Concern   Not on file  Social History Narrative   Not on file   Social Determinants of Health   Financial Resource Strain: Not on file  Food Insecurity: Not on file  Transportation Needs: Not on file  Physical Activity: Not on file  Stress: Not on file  Social Connections: Not on file     Family History:  The patient's family history includes Diabetes in her father; Heart disease (age of onset: 65) in her brother;  Hypertension in her mother. There is no history of Breast cancer.  ROS:   12-point review of systems is negative unless otherwise noted in the HPI.   EKGs/Labs/Other Studies Reviewed:    Studies reviewed were summarized above. The additional studies were reviewed today:  2D echo 12/29/2021: 1. Left ventricular ejection fraction, by estimation, is 60 to 65%. The  left ventricle has normal function. The left ventricle has no regional  wall motion abnormalities. Left ventricular diastolic parameters are  consistent with Grade I diastolic  dysfunction (impaired relaxation).  2. Right ventricular systolic function is normal. The right ventricular  size is normal. There is mildly elevated pulmonary artery systolic  pressure. The estimated right ventricular systolic pressure is 42.9 mmHg.   3. Left atrial size was moderately dilated.   4. The mitral valve is normal in structure. Mild mitral valve  regurgitation. No evidence of mitral stenosis. Moderate mitral annular  calcification.   5. The aortic valve is normal in structure. Aortic valve regurgitation is  mild. No aortic stenosis is present.   6. The inferior vena cava is normal in size with greater than 50%  respiratory variability, suggesting right atrial pressure of 3 mmHg.  __________   Luci Bank patch 07/2021: The patient was monitored for 14 days. The predominant rhythm was sinus with an average rate of 57 bpm (range 42-106 bpm in sinus). There were occasional PAC's (burden 1.1%) and rare PVC's. One episode of nonsustained ventricular tachycardia occurred, lasting 6 beats with a maximum rate of 116 bpm. There were 109 atrial runs lasting up to 14.7 seconds with a maximum rate of 193 bpm. No sustained arrhythmia or prolonged pause occurred. There were no patient triggered events.   Predominantly sinus rhythm with occasional PAC's and rare PVC's.  Multiple episodes of PSVT occurred, as well as a single brief run of NSVT. ___________    Luci Bank patch 06/2020: The patient was monitored for 14 days. The predominant rhythm was sinus with an average rate of 66 bpm (range 47 - 120 bpm in sinus). There were occasional PAC's (1.8% burden) and rare PVC's. A single 7-beat run of nonsustained ventricular tachycardia occurred with a maximum rate of 152 bpm. There were 157 episodes of supraventricular tachycardia lasting up to 26.5 seconds with a maximum rate of 207 bpm. There were no patient triggered events.   Predominantly sinus rhythm with occasional PAC and rare PVC's.  157 episodes of PSVT occurred, as well as one short run of NSVT. __________   2D echo 03/04/2020: 1. Left ventricular ejection fraction, by estimation, is 60 to 65%. The  left ventricle has normal function. The left ventricle has no regional  wall motion abnormalities. There is mild left ventricular hypertrophy.  Left ventricular diastolic parameters  are consistent with Grade I diastolic dysfunction (impaired relaxation).   2. Right ventricular systolic function is normal. The right ventricular  size is normal. There is mildly elevated pulmonary artery systolic  pressure. The estimated right ventricular systolic pressure is 42.2 mmHg.   3. Mild mitral valve regurgitation.   4. The aortic valve is normal in structure. Aortic valve regurgitation is  mild. Mild to moderate aortic valve sclerosis/calcification is present,  without any evidence of aortic stenosis. __________   2D echo 12/2018 (outside office): Hyperdynamic EF > 70%, moderate LVH with small left ventricle, mild aortic valve calcification with mild stenosis and moderate regurgitation, moderate mitral annular calcification with mild regurgitation, mild tricuspid regurgitation, mild pulmonary hypertension   EKG:  EKG is ordered today.  The EKG ordered today demonstrates sinus bradycardia with occasional PACs, nonspecific ST-T changes consistent with prior tracing  Recent Labs: 12/29/2021: TSH  1.041 01/07/2022: ALT 20; BUN 28; Creatinine, Ser 1.05; Hemoglobin 11.9; Platelets 235; Potassium 4.0; Sodium 139  Recent Lipid Panel    Component Value Date/Time   CHOL 207 (H) 10/31/2019 0906   CHOL 228 (H) 07/25/2018 1624   TRIG 49 10/31/2019 0906   HDL 79 10/31/2019 0906   HDL 92 07/25/2018 1624   CHOLHDL 2.6 10/31/2019 0906  VLDL 10 10/31/2019 0906   LDLCALC 118 (H) 10/31/2019 0906   LDLCALC 120 (H) 07/25/2018 1624    PHYSICAL EXAM:    VS:  BP (!) 144/72   Pulse (!) 54   Ht 5' 2.5" (1.588 m)   Wt 129 lb 3.2 oz (58.6 kg)   SpO2 97%   BMI 23.25 kg/m   BMI: Body mass index is 23.25 kg/m.  Physical Exam Vitals reviewed.  Constitutional:      Appearance: She is well-developed.  HENT:     Head: Normocephalic and atraumatic.  Eyes:     General:        Right eye: No discharge.        Left eye: No discharge.  Neck:     Vascular: No JVD.  Cardiovascular:     Rate and Rhythm: Normal rate and regular rhythm.     Pulses:          Posterior tibial pulses are 2+ on the right side and 2+ on the left side.     Heart sounds: S1 normal and S2 normal. Heart sounds not distant. No midsystolic click and no opening snap. Murmur heard.     Systolic murmur is present with a grade of 2/6 at the upper right sternal border.     Systolic murmur of grade 2/6 is also present at the lower left sternal border.     No friction rub.  Pulmonary:     Effort: Pulmonary effort is normal. No respiratory distress.     Breath sounds: Normal breath sounds. No decreased breath sounds, wheezing or rales.  Chest:     Chest wall: No tenderness.  Abdominal:     General: There is no distension.  Musculoskeletal:     Cervical back: Normal range of motion.     Right lower leg: No edema.     Left lower leg: No edema.  Skin:    General: Skin is warm and dry.     Nails: There is no clubbing.  Neurological:     Mental Status: She is alert and oriented to person, place, and time.  Psychiatric:         Speech: Speech normal.        Behavior: Behavior normal.        Thought Content: Thought content normal.        Judgment: Judgment normal.     Wt Readings from Last 3 Encounters:  10/17/22 129 lb 3.2 oz (58.6 kg)  09/27/22 124 lb 9.6 oz (56.5 kg)  06/27/22 127 lb 6.4 oz (57.8 kg)     ASSESSMENT & PLAN:   PSVT: Quiescent.  Not on standing AV nodal blocking medication secondary to underlying sinus bradycardia.  Episodes have been brief.  Previously evaluated by EP with atrial arrhythmia not felt to have contributed to her syncopal episode.  If symptoms become progressive, consider repeat EP evaluation.  Valvular heart disease: Stable on echo in 12/2021.  Update echo in 6 months.  Sinus bradycardia: Asymptomatic.  Not on AV nodal blocking medications.  She has demonstrated appropriate chronotropic competence in the office previously and on outpatient cardiac monitoring.  Recent Zio patch showed no evidence of high-grade AV block or prolonged pauses.  Continue to avoid AV nodal blocking medications.  No current indication for PPM implantation.  Vasovagal syncope: No further episodes.  HTN: Blood pressure is mildly elevated in the office today, though typically well-controlled at home.  She remains on amlodipine, HCTZ, and losartan with  stable renal function and electrolytes on most recent labs.  She will monitor her blood pressure at home and notify our office if her readings are consistently in the 140s systolic or higher.  HLD: LDL 118 in 10/2019.  She remains on atorvastatin 10 mg.  Followed by PCP.   Disposition: F/u with Dr. Okey Dupre or an APP in 6 months, and EP as directed.   Medication Adjustments/Labs and Tests Ordered: Current medicines are reviewed at length with the patient today.  Concerns regarding medicines are outlined above. Medication changes, Labs and Tests ordered today are summarized above and listed in the Patient Instructions accessible in Encounters.   Signed, Eula Listen, PA-C 10/17/2022 12:27 PM     [facility] -  [address] [place], [place] [address] [phone]

## 2022-10-17 ENCOUNTER — Encounter: Payer: Self-pay | Admitting: Physician Assistant

## 2022-10-17 ENCOUNTER — Ambulatory Visit: Payer: 59 | Attending: Internal Medicine | Admitting: Physician Assistant

## 2022-10-17 VITALS — BP 144/72 | HR 54 | Ht 62.5 in | Wt 129.2 lb

## 2022-10-17 DIAGNOSIS — I471 Supraventricular tachycardia, unspecified: Secondary | ICD-10-CM

## 2022-10-17 DIAGNOSIS — R55 Syncope and collapse: Secondary | ICD-10-CM

## 2022-10-17 DIAGNOSIS — I10 Essential (primary) hypertension: Secondary | ICD-10-CM

## 2022-10-17 DIAGNOSIS — I38 Endocarditis, valve unspecified: Secondary | ICD-10-CM | POA: Diagnosis not present

## 2022-10-17 DIAGNOSIS — E785 Hyperlipidemia, unspecified: Secondary | ICD-10-CM | POA: Diagnosis not present

## 2022-10-17 DIAGNOSIS — R001 Bradycardia, unspecified: Secondary | ICD-10-CM

## 2022-10-17 NOTE — Patient Instructions (Signed)
Medication Instructions:  No changes at this time  *If you need a refill on your cardiac medications before your next appointment, please call your pharmacy*   Lab Work: None  If you have labs (blood work) drawn today and your tests are completely normal, you will receive your results only by: MyChart Message (if you have MyChart) OR A paper copy in the mail If you have any lab test that is abnormal or we need to change your treatment, we will call you to review the results.   Testing/Procedures: Your physician has requested that you have an echocardiogram in 6 months. Echocardiography is a painless test that uses sound waves to create images of your heart. It provides your doctor with information about the size and shape of your heart and how well your heart's chambers and valves are working. This procedure takes approximately one hour. There are no restrictions for this procedure. Please do NOT wear cologne, perfume, aftershave, or lotions (deodorant is allowed). Please arrive 15 minutes prior to your appointment time.    Follow-Up: At [facility], you and your health needs are our priority.  As part of our continuing mission to provide you with exceptional heart care, we have created designated Provider Care Teams.  These Care Teams include your primary Cardiologist (physician) and Advanced Practice Providers (APPs -  Physician Assistants and Nurse Practitioners) who all work together to provide you with the care you need, when you need it.  Your next appointment:   Follow up after your echocardiogram has been done.   Provider:   Yvonne Kendall, MD or Eula Listen, PA-C

## 2022-10-19 ENCOUNTER — Other Ambulatory Visit: Payer: Self-pay | Admitting: Nurse Practitioner

## 2022-10-19 DIAGNOSIS — E559 Vitamin D deficiency, unspecified: Secondary | ICD-10-CM

## 2022-10-28 ENCOUNTER — Other Ambulatory Visit: Payer: Self-pay | Admitting: Nurse Practitioner

## 2022-10-28 DIAGNOSIS — M810 Age-related osteoporosis without current pathological fracture: Secondary | ICD-10-CM

## 2022-10-30 DIAGNOSIS — H35351 Cystoid macular degeneration, right eye: Secondary | ICD-10-CM | POA: Diagnosis not present

## 2022-10-30 DIAGNOSIS — H35352 Cystoid macular degeneration, left eye: Secondary | ICD-10-CM | POA: Diagnosis not present

## 2022-11-01 DIAGNOSIS — H40003 Preglaucoma, unspecified, bilateral: Secondary | ICD-10-CM | POA: Diagnosis not present

## 2022-11-02 DIAGNOSIS — H40043 Steroid responder, bilateral: Secondary | ICD-10-CM | POA: Diagnosis not present

## 2022-11-05 ENCOUNTER — Other Ambulatory Visit: Payer: Self-pay | Admitting: Nurse Practitioner

## 2022-11-08 ENCOUNTER — Other Ambulatory Visit: Payer: Self-pay | Admitting: Physician Assistant

## 2022-11-08 DIAGNOSIS — I10 Essential (primary) hypertension: Secondary | ICD-10-CM

## 2022-11-10 DIAGNOSIS — H40043 Steroid responder, bilateral: Secondary | ICD-10-CM | POA: Diagnosis not present

## 2022-11-19 ENCOUNTER — Other Ambulatory Visit: Payer: Self-pay | Admitting: Nurse Practitioner

## 2022-11-19 DIAGNOSIS — K219 Gastro-esophageal reflux disease without esophagitis: Secondary | ICD-10-CM

## 2022-11-21 ENCOUNTER — Other Ambulatory Visit: Payer: Self-pay | Admitting: Physician Assistant

## 2022-11-23 ENCOUNTER — Ambulatory Visit (INDEPENDENT_AMBULATORY_CARE_PROVIDER_SITE_OTHER): Payer: 59 | Admitting: Nurse Practitioner

## 2022-11-23 ENCOUNTER — Encounter: Payer: Self-pay | Admitting: Nurse Practitioner

## 2022-11-23 VITALS — BP 124/68 | HR 60 | Temp 98.3°F | Resp 16 | Ht 62.0 in | Wt 126.6 lb

## 2022-11-23 DIAGNOSIS — E538 Deficiency of other specified B group vitamins: Secondary | ICD-10-CM

## 2022-11-23 DIAGNOSIS — E1165 Type 2 diabetes mellitus with hyperglycemia: Secondary | ICD-10-CM

## 2022-11-23 DIAGNOSIS — E782 Mixed hyperlipidemia: Secondary | ICD-10-CM | POA: Diagnosis not present

## 2022-11-23 DIAGNOSIS — Z0001 Encounter for general adult medical examination with abnormal findings: Secondary | ICD-10-CM

## 2022-11-23 DIAGNOSIS — I10 Essential (primary) hypertension: Secondary | ICD-10-CM

## 2022-11-23 DIAGNOSIS — E559 Vitamin D deficiency, unspecified: Secondary | ICD-10-CM

## 2022-11-23 MED ORDER — LOSARTAN POTASSIUM 100 MG PO TABS: 100.0000 mg | ORAL_TABLET | Freq: Every day | ORAL | 3 refills | Status: DC

## 2022-11-23 MED ORDER — GLIMEPIRIDE 4 MG PO TABS
ORAL_TABLET | ORAL | 2 refills | Status: DC
Start: 2022-11-23 — End: 2023-08-27

## 2022-11-23 MED ORDER — FOLIC ACID 1 MG PO TABS
2.0000 mg | ORAL_TABLET | Freq: Every day | ORAL | 3 refills | Status: DC
Start: 2022-11-23 — End: 2023-12-27

## 2022-11-23 NOTE — Progress Notes (Signed)
[facility] [address] [place], [place] [address]  Internal MEDICINE  Office Visit Note  Patient Name: [name]  [id number]  [id number]  Date of Service: 11/23/2022  Chief Complaint  Patient presents with   Diabetes   Gastroesophageal Reflux   Hypertension   Hyperlipidemia   Medicare Wellness    HPI Larae presents for an annual well visit and physical exam.  Well-appearing 82 y.o. female with hypertension, PSVT, stage 3a CKD, RA, diabetes, high cholesterol, GAD, and low vitamin D Routine mammogram: done in January  DEXA scan: 2017, repeat in January with mammogram.  Eye exam and/or foot exam: Labs: due for routine labs  New or worsening pain: right side and low back s/p compression fracture        11/23/2022   10:01 AM 11/17/2021    9:17 AM 10/21/2020    8:59 AM  MMSE - Mini Mental State Exam  Orientation to time 5 5 5   Orientation to Place 5 5 5   Registration 3 3 3   Attention/ Calculation 5 5 5   Recall 3 3 3   Language- name 2 objects 2 2 2   Language- repeat 1 1 1   Language- follow 3 step command 3 3 3   Language- read & follow direction 1 1 1   Write a sentence 1 1 1   Copy design 1 1 1   Total score 30 30 30     Functional Status Survey: Is the patient deaf or have difficulty hearing?: No Does the patient have difficulty seeing, even when wearing glasses/contacts?: Yes Does the patient have difficulty concentrating, remembering, or making decisions?: Yes Does the patient have difficulty walking or climbing stairs?: No Does the patient have difficulty dressing or bathing?: No Does the patient have difficulty doing errands alone such as visiting a doctor's office or shopping?: No     12/30/2021    8:00 AM 01/25/2022    9:33 AM 03/08/2022   10:51 AM 06/27/2022    2:33 PM 11/23/2022    9:58 AM  Fall Risk  Falls in the past year?  1 0 0 0  Was there an injury with Fall?  0 0 0 0  Fall Risk Category Calculator  1 0 0 0  Fall Risk Category  (Retired)  Low Low    (RETIRED) Patient Fall Risk Level High fall risk Low fall risk Low fall risk    Patient at Risk for Falls Due to  History of fall(s) No Fall Risks No Fall Risks No Fall Risks  Fall risk Follow up  Falls evaluation completed Falls evaluation completed Falls evaluation completed Falls evaluation completed       11/23/2022    9:58 AM  Depression screen PHQ 2/9  Decreased Interest 0  Down, Depressed, Hopeless 0  PHQ - 2 Score 0        No data to display            Current Medication: Outpatient Encounter Medications as of 11/23/2022  Medication Sig   acetaminophen (TYLENOL) 500 MG tablet Take 500-1,000 mg by mouth every 6 (six) hours as needed.   Alcohol Swabs (ALCOHOL PREP) PADS    aspirin EC 81 MG tablet Take 1 tablet (81 mg total) by mouth daily.   COMBIGAN 0.2-0.5 % ophthalmic solution Apply 1 drop to eye 2 (two) times daily. Left eye   Ferrous Gluconate (IRON 27 PO) Take 27 mg by mouth daily.  (Patient not taking: Reported on 10/17/2022)   folic acid (FOLVITE) 1  MG tablet Take 2 tablets (2 mg total) by mouth daily.   glimepiride (AMARYL) 4 MG tablet TAKE ONE-HALF TABLET BY MOUTH  WITH BREAKFAST AND ONE-HALF  TABLET BY MOUTH WITH DINNER   losartan (COZAAR) 100 MG tablet Take 1 tablet (100 mg total) by mouth daily.   methotrexate 2.5 MG tablet Take 15 mg by mouth once a week.  (Patient not taking: Reported on 10/17/2022)   [DISCONTINUED] amLODipine (NORVASC) 5 MG tablet TAKE 1 TABLET (5 mg)  BY MOUTH IN  THE EVENING   [DISCONTINUED] atorvastatin (LIPITOR) 10 MG tablet TAKE 1 TABLET BY MOUTH AT  BEDTIME FOR HIGH  CHOLESTEROL   [DISCONTINUED] folic acid (FOLVITE) 1 MG tablet TAKE 2 TABLETS BY MOUTH  DAILY   [DISCONTINUED] glimepiride (AMARYL) 4 MG tablet TAKE ONE-HALF TABLET BY  MOUTH WITH BREAKFAST AND  ONE-HALF TABLET BY MOUTH  WITH DINNER   [DISCONTINUED] hydrochlorothiazide (HYDRODIURIL) 25 MG tablet Take 25 mg by mouth daily.   [DISCONTINUED] losartan  (COZAAR) 100 MG tablet Take 1 tablet (100 mg total) by mouth daily.   [DISCONTINUED] pantoprazole (PROTONIX) 40 MG tablet Take 1 tablet (40 mg total) by mouth daily. TAKE 1 TABLET DAILY FOR    REFLUX   [DISCONTINUED] Vitamin D, Ergocalciferol, (DRISDOL) 1.25 MG (50000 UNIT) CAPS capsule Take 1 capsule (50,000 Units total) by mouth once a week.   No facility-administered encounter medications on file as of 11/23/2022.    Surgical History: Past Surgical History:  Procedure Laterality Date   ABDOMINAL HYSTERECTOMY     ANTERIOR VITRECTOMY Left 03/10/2020   Procedure: ANTERIOR VITRECTOMY;  Surgeon: Lockie Mola, MD;  Location: [facility];  Service: Ophthalmology;  Laterality: Left;   CATARACT EXTRACTION     CATARACT EXTRACTION W/PHACO Left 03/10/2020   Procedure: CATARACT EXTRACTION PHACO  (IOC) LEFT  DIABETIC 16.27  02:09.7  12.5%;  Surgeon: Lockie Mola, MD;  Location: [facility];  Service: Ophthalmology;  Laterality: Left;  Diabetic - oral meds   COLONOSCOPY WITH PROPOFOL N/A 10/25/2017   Procedure: COLONOSCOPY WITH PROPOFOL;  Surgeon: Christena Deem, MD;  Location[facility];  Service: Endoscopy;  Laterality: N/A;   ESOPHAGOGASTRODUODENOSCOPY (EGD) WITH PROPOFOL N/A 10/25/2017   Procedure: ESOPHAGOGASTRODUODENOSCOPY (EGD) WITH PROPOFOL;  Surgeon: Christena Deem, MD;  Location: [facility] ENDOSCOPY;  Service: Endoscopy;  Laterality: N/A;   ESOPHAGOGASTRODUODENOSCOPY (EGD) WITH PROPOFOL N/A 03/04/2019   Procedure: ESOPHAGOGASTRODUODENOSCOPY (EGD) WITH PROPOFOL;  Surgeon: Christena Deem, MD;  Location: [facility] END[facility]COPY;  Service: Endoscopy;  Laterality: N/A;   EUS N/A 01/17/2018   Procedure: ESOPHAGEAL ENDOSCOPIC ULTRASOUND (EUS) RADIAL;  Surgeon: Doren Custard, MD;  Location: [facility] ENDOSCOPY;  Service: Gastroenterology;  Laterality: N/A;   right side had fatty tissue taken off     TONSILLECTOMY      Medical History: Past Medical History:  Diagnosis  Date   Anemia    Arthritis    Diabetes mellitus without complication (HCC)    GERD (gastroesophageal reflux disease)    Heart murmur    Hyperlipidemia    Hypertension    Leaky heart valve    Rheumatoid arthritis (HCC)    Wears dentures     Family History: Family History  Problem Relation Age of Onset   Hypertension Mother    Diabetes Father    Heart disease Brother 42       Open heart surgery (details unknown)   Breast cancer Neg Hx     Social History   Socioeconomic History   Marital  status: Widowed    Spouse name: Not on file   Number of children: Not on file   Years of education: Not on file   Highest education level: Not on file  Occupational History   Not on file  Tobacco Use   Smoking status: Never   Smokeless tobacco: Never  Vaping Use   Vaping Use: Never used  Substance and Sexual Activity   Alcohol use: No   Drug use: No   Sexual activity: Not on file  Other Topics Concern   Not on file  Social History Narrative   Not on file   Social Determinants of Health   Financial Resource Strain: Not on file  Food Insecurity: Not on file  Transportation Needs: Not on file  Physical Activity: Not on file  Stress: Not on file  Social Connections: Not on file  Intimate Partner Violence: Not on file      Review of Systems  Constitutional:  Negative for activity change, appetite change, chills, fatigue, fever and unexpected weight change.  HENT: Negative.  Negative for congestion, ear pain, rhinorrhea, sore throat and trouble swallowing.   Eyes: Negative.   Respiratory: Negative.  Negative for cough, chest tightness, shortness of breath and wheezing.   Cardiovascular: Negative.  Negative for chest pain.  Gastrointestinal: Negative.  Negative for abdominal pain, blood in stool, constipation, diarrhea, nausea and vomiting.  Endocrine: Negative.   Genitourinary: Negative.  Negative for difficulty urinating, dysuria, frequency, hematuria and urgency.   Musculoskeletal: Negative.  Negative for arthralgias, back pain, joint swelling, myalgias and neck pain.  Skin: Negative.  Negative for rash and wound.  Allergic/Immunologic: Negative.  Negative for immunocompromised state.  Neurological: Negative.  Negative for dizziness, seizures, numbness and headaches.  Hematological: Negative.   Psychiatric/Behavioral: Negative.  Negative for behavioral problems, self-injury and suicidal ideas. The patient is not nervous/anxious.     Vital Signs: BP 124/68   Pulse 60   Temp 98.3 F (36.8 C)   Resp 16   Ht 5\' 2"  (1.575 m)   Wt 126 lb 9.6 oz (57.4 kg)   SpO2 99%   BMI 23.16 kg/m    Physical Exam Vitals reviewed.  Constitutional:      General: She is not in acute distress.    Appearance: Normal appearance. She is well-developed and normal weight. She is not ill-appearing or diaphoretic.  HENT:     Head: Normocephalic and atraumatic.     Right Ear: Tympanic membrane, ear canal and external ear normal.     Left Ear: Tympanic membrane, ear canal and external ear normal.     Nose: Nose normal.     Mouth/Throat:     Pharynx: No oropharyngeal exudate.  Eyes:     General: No scleral icterus.       Right eye: No discharge.        Left eye: No discharge.     Conjunctiva/sclera: Conjunctivae normal.     Pupils: Pupils are equal, round, and reactive to light.  Neck:     Thyroid: No thyromegaly.     Vascular: No JVD.     Trachea: No tracheal deviation.  Cardiovascular:     Rate and Rhythm: Normal rate and regular rhythm.     Pulses:          Dorsalis pedis pulses are 2+ on the right side and 2+ on the left side.       Posterior tibial pulses are 1+ on the right side and 1+ on  the left side.     Heart sounds: Murmur heard.     No friction rub. No gallop.  Pulmonary:     Effort: Pulmonary effort is normal. No respiratory distress.     Breath sounds: Normal breath sounds. No stridor. No wheezing or rales.  Chest:     Chest wall: No  tenderness.  Abdominal:     General: Bowel sounds are normal. There is no distension.     Palpations: Abdomen is soft. There is no mass.     Tenderness: There is no abdominal tenderness. There is no guarding or rebound.  Musculoskeletal:        General: No tenderness or deformity. Normal range of motion.     Cervical back: Normal range of motion and neck supple.  Feet:     Right foot:     Skin integrity: Dry skin present. No ulcer, blister, skin breakdown, erythema, warmth, callus or fissure.     Toenail Condition: Right toenails are abnormally thick and long. Fungal disease present.    Left foot:     Skin integrity: Dry skin present.     Toenail Condition: Left toenails are abnormally thick and long. Fungal disease present. Lymphadenopathy:     Cervical: No cervical adenopathy.  Skin:    General: Skin is warm and dry.     Coloration: Skin is not pale.     Findings: No erythema or rash.  Neurological:     Mental Status: She is alert.     Cranial Nerves: No cranial nerve deficit.     Motor: No abnormal muscle tone.     Coordination: Coordination normal.     Deep Tendon Reflexes: Reflexes are normal and symmetric.  Psychiatric:        Behavior: Behavior normal.        Thought Content: Thought content normal.        Judgment: Judgment normal.        Assessment/Plan: 1. Encounter for routine adult health examination with abnormal findings Age-appropriate preventive screenings and vaccinations discussed, annual physical exam completed. Routine labs for health maintenance ordered, see below. PHM updated.  - Vitamin D (25 hydroxy) - Lipid Profile - B12 and Folate Panel - CMP14+EGFR - CBC with Differential/Platelet - folic acid (FOLVITE) 1 MG tablet; Take 2 tablets (2 mg total) by mouth daily.  Dispense: 180 tablet; Refill: 3  2. Type 2 diabetes mellitus with hyperglycemia, without long-term current use of insulin (HCC) Continue glimepiride as prescribed. Routine labs ordered   - Vitamin D (25 hydroxy) - Lipid Profile - B12 and Folate Panel - CMP14+EGFR - glimepiride (AMARYL) 4 MG tablet; TAKE ONE-HALF TABLET BY MOUTH  WITH BREAKFAST AND ONE-HALF  TABLET BY MOUTH WITH DINNER  Dispense: 100 tablet; Refill: 2 - Urine Microalbumin w/creat. ratio  3. Essential hypertension Continue losartan as prescribed. Routine labs ordered  - Lipid Profile - CMP14+EGFR - CBC with Differential/Platelet - losartan (COZAAR) 100 MG tablet; Take 1 tablet (100 mg total) by mouth daily.  Dispense: 90 tablet; Refill: 3  4. Mixed hyperlipidemia Routine labs ordered  - Lipid Profile - CMP14+EGFR - CBC with Differential/Platelet  5. B12 deficiency Routine labs ordered  - B12 and Folate Panel - CBC with Differential/Platelet  6. Vitamin D deficiency Routine lab ordered  - Vitamin D (25 hydroxy)     General Counseling: Kama verbalizes understanding of the findings of todays visit and agrees with plan of treatment. I have discussed any further diagnostic evaluation that may  be needed or ordered today. We also reviewed her medications today. she has been encouraged to call the office with any questions or concerns that should arise related to todays visit.    Orders Placed This Encounter  Procedures   Vitamin D (25 hydroxy)   Lipid Profile   B12 and Folate Panel   CMP14+EGFR   CBC with Differential/Platelet    Meds ordered this encounter  Medications   folic acid (FOLVITE) 1 MG tablet    Sig: Take 2 tablets (2 mg total) by mouth daily.    Dispense:  180 tablet    Refill:  3    Requesting 1 year supply   glimepiride (AMARYL) 4 MG tablet    Sig: TAKE ONE-HALF TABLET BY MOUTH  WITH BREAKFAST AND ONE-HALF  TABLET BY MOUTH WITH DINNER    Dispense:  100 tablet    Refill:  2    For future refills   losartan (COZAAR) 100 MG tablet    Sig: Take 1 tablet (100 mg total) by mouth daily.    Dispense:  90 tablet    Refill:  3    For future refills    Return in about 3  months (around 02/23/2023) for F/U, Recheck A1C, Abbee Cremeens PCP.   Total time spent:30 Minutes Time spent includes review of chart, medications, test results, and follow up plan with the patient.   [place] Controlled Substance Database was reviewed by me.  This patient was seen by Sallyanne Kuster, FNP-C in collaboration with Dr. Beverely Risen as a part of collaborative care agreement.  Shameeka Silliman R. Tedd Sias, MSN, FNP-C Internal medicine

## 2022-11-24 LAB — MICROALBUMIN / CREATININE URINE RATIO
Creatinine, Urine: 86.9 mg/dL
Microalb/Creat Ratio: 29 mg/g creat (ref 0–29)
Microalbumin, Urine: 25.2 ug/mL

## 2022-12-18 ENCOUNTER — Other Ambulatory Visit
Admission: RE | Admit: 2022-12-18 | Discharge: 2022-12-18 | Disposition: A | Discharge status: Home / Self Care | Payer: 59 | Attending: Nurse Practitioner | Admitting: Nurse Practitioner

## 2022-12-18 DIAGNOSIS — M0579 Rheumatoid arthritis with rheumatoid factor of multiple sites without organ or systems involvement: Secondary | ICD-10-CM | POA: Insufficient documentation

## 2022-12-18 DIAGNOSIS — E538 Deficiency of other specified B group vitamins: Secondary | ICD-10-CM | POA: Insufficient documentation

## 2022-12-18 DIAGNOSIS — I10 Essential (primary) hypertension: Secondary | ICD-10-CM | POA: Diagnosis not present

## 2022-12-18 DIAGNOSIS — H35352 Cystoid macular degeneration, left eye: Secondary | ICD-10-CM | POA: Diagnosis not present

## 2022-12-18 DIAGNOSIS — Z0001 Encounter for general adult medical examination with abnormal findings: Secondary | ICD-10-CM | POA: Insufficient documentation

## 2022-12-18 DIAGNOSIS — E782 Mixed hyperlipidemia: Secondary | ICD-10-CM | POA: Insufficient documentation

## 2022-12-18 DIAGNOSIS — E559 Vitamin D deficiency, unspecified: Secondary | ICD-10-CM | POA: Insufficient documentation

## 2022-12-18 DIAGNOSIS — E1165 Type 2 diabetes mellitus with hyperglycemia: Secondary | ICD-10-CM | POA: Insufficient documentation

## 2022-12-18 LAB — COMPREHENSIVE METABOLIC PANEL
ALT: 14 U/L (ref 0–44)
AST: 22 U/L (ref 15–41)
Albumin: 4.2 g/dL (ref 3.5–5.0)
Alkaline Phosphatase: 69 U/L (ref 38–126)
Anion gap: 10 (ref 5–15)
BUN: 24 mg/dL — ABNORMAL HIGH (ref 8–23)
CO2: 27 mmol/L (ref 22–32)
Calcium: 9.4 mg/dL (ref 8.9–10.3)
Chloride: 103 mmol/L (ref 98–111)
Creatinine, Ser: 1.04 mg/dL — ABNORMAL HIGH (ref 0.44–1.00)
GFR, Estimated: 53 mL/min — ABNORMAL LOW (ref >60)
Glucose, Bld: 87 mg/dL (ref 70–99)
Potassium: 3.5 mmol/L (ref 3.5–5.1)
Sodium: 140 mmol/L (ref 135–145)
Total Bilirubin: 0.7 mg/dL (ref 0.3–1.2)
Total Protein: 7.7 g/dL (ref 6.5–8.1)

## 2022-12-18 LAB — CBC WITH DIFFERENTIAL/PLATELET
Abs Immature Granulocytes: 0.01 10*3/uL (ref 0.00–0.07)
Basophils Absolute: 0 10*3/uL (ref 0.0–0.1)
Basophils Relative: 1 %
Eosinophils Absolute: 0.2 10*3/uL (ref 0.0–0.5)
Eosinophils Relative: 3 %
HCT: 37.2 % (ref 36.0–46.0)
Hemoglobin: 12.1 g/dL (ref 12.0–15.0)
Immature Granulocytes: 0 %
Lymphocytes Relative: 26 %
Lymphs Abs: 1.3 10*3/uL (ref 0.7–4.0)
MCH: 28 pg (ref 26.0–34.0)
MCHC: 32.5 g/dL (ref 30.0–36.0)
MCV: 86.1 fL (ref 80.0–100.0)
Monocytes Absolute: 0.4 10*3/uL (ref 0.1–1.0)
Monocytes Relative: 8 %
Neutro Abs: 3.2 10*3/uL (ref 1.7–7.7)
Neutrophils Relative %: 62 %
Platelets: 157 10*3/uL (ref 150–400)
RBC: 4.32 MIL/uL (ref 3.87–5.11)
RDW: 14.6 % (ref 11.5–15.5)
WBC: 5.1 10*3/uL (ref 4.0–10.5)
nRBC: 0 % (ref 0.0–0.2)

## 2022-12-18 LAB — LIPID PANEL
Cholesterol: 228 mg/dL — ABNORMAL HIGH (ref 0–200)
HDL: 82 mg/dL (ref >40)
LDL Cholesterol: 131 mg/dL — ABNORMAL HIGH (ref 0–99)
Total CHOL/HDL Ratio: 2.8 RATIO
Triglycerides: 73 mg/dL (ref <150)
VLDL: 15 mg/dL (ref 0–40)

## 2022-12-18 LAB — FOLATE: Folate: >40 ng/mL (ref >5.9)

## 2022-12-18 LAB — VITAMIN B12: Vitamin B-12: 2234 pg/mL — ABNORMAL HIGH (ref 180–914)

## 2022-12-18 LAB — VITAMIN D 25 HYDROXY (VIT D DEFICIENCY, FRACTURES): Vit D, 25-Hydroxy: 79.63 ng/mL (ref 30–100)

## 2022-12-19 DIAGNOSIS — H40043 Steroid responder, bilateral: Secondary | ICD-10-CM | POA: Diagnosis not present

## 2022-12-29 DIAGNOSIS — H401430 Capsular glaucoma with pseudoexfoliation of lens, bilateral, stage unspecified: Secondary | ICD-10-CM | POA: Diagnosis not present

## 2022-12-29 DIAGNOSIS — H40043 Steroid responder, bilateral: Secondary | ICD-10-CM | POA: Diagnosis not present

## 2023-01-05 DIAGNOSIS — H40043 Steroid responder, bilateral: Secondary | ICD-10-CM | POA: Diagnosis not present

## 2023-01-05 DIAGNOSIS — H401430 Capsular glaucoma with pseudoexfoliation of lens, bilateral, stage unspecified: Secondary | ICD-10-CM | POA: Diagnosis not present

## 2023-01-18 DIAGNOSIS — M79675 Pain in left toe(s): Secondary | ICD-10-CM | POA: Diagnosis not present

## 2023-01-18 DIAGNOSIS — B351 Tinea unguium: Secondary | ICD-10-CM | POA: Diagnosis not present

## 2023-01-18 DIAGNOSIS — M79674 Pain in right toe(s): Secondary | ICD-10-CM | POA: Diagnosis not present

## 2023-02-06 DIAGNOSIS — H35351 Cystoid macular degeneration, right eye: Secondary | ICD-10-CM | POA: Diagnosis not present

## 2023-02-06 DIAGNOSIS — H35352 Cystoid macular degeneration, left eye: Secondary | ICD-10-CM | POA: Diagnosis not present

## 2023-02-07 DIAGNOSIS — M0579 Rheumatoid arthritis with rheumatoid factor of multiple sites without organ or systems involvement: Secondary | ICD-10-CM | POA: Diagnosis not present

## 2023-02-07 DIAGNOSIS — Z79899 Other long term (current) drug therapy: Secondary | ICD-10-CM | POA: Diagnosis not present

## 2023-02-07 DIAGNOSIS — M8000XD Age-related osteoporosis with current pathological fracture, unspecified site, subsequent encounter for fracture with routine healing: Secondary | ICD-10-CM | POA: Diagnosis not present

## 2023-02-07 DIAGNOSIS — Z8781 Personal history of (healed) traumatic fracture: Secondary | ICD-10-CM | POA: Diagnosis not present

## 2023-02-23 ENCOUNTER — Ambulatory Visit: Payer: 59 | Admitting: Nurse Practitioner

## 2023-02-27 ENCOUNTER — Encounter: Payer: Self-pay | Admitting: Nurse Practitioner

## 2023-02-27 ENCOUNTER — Ambulatory Visit (INDEPENDENT_AMBULATORY_CARE_PROVIDER_SITE_OTHER): Payer: 59 | Admitting: Nurse Practitioner

## 2023-02-27 VITALS — BP 122/70 | HR 57 | Temp 97.2°F | Resp 16 | Ht 62.0 in | Wt 125.6 lb

## 2023-02-27 DIAGNOSIS — E785 Hyperlipidemia, unspecified: Secondary | ICD-10-CM | POA: Diagnosis not present

## 2023-02-27 DIAGNOSIS — E1169 Type 2 diabetes mellitus with other specified complication: Secondary | ICD-10-CM

## 2023-02-27 DIAGNOSIS — I10 Essential (primary) hypertension: Secondary | ICD-10-CM | POA: Diagnosis not present

## 2023-02-27 DIAGNOSIS — E1165 Type 2 diabetes mellitus with hyperglycemia: Secondary | ICD-10-CM | POA: Diagnosis not present

## 2023-02-27 LAB — POCT GLYCOSYLATED HEMOGLOBIN (HGB A1C): Hemoglobin A1C: 6.7 % — AB (ref 4.0–5.6)

## 2023-02-27 NOTE — Progress Notes (Signed)
[facility] [address] [place], [place] [address]  Internal MEDICINE  Office Visit Note  Patient Name: [name]  [id number]  [id number]  Date of Service: 02/27/2023  Chief Complaint  Patient presents with   Diabetes   Gastroesophageal Reflux   Hypertension   Hyperlipidemia    HPI [name] presents for a follow-up visit for diabetes, hypertension and high cholesterol.  Diabetes -- A1c slightly increased to 6.7 today from 6.3 in may 2024.  Hypertension -- controlled with current medications High cholesterol -- taking atorvastatin  Nutrition Risk Assessment:  Has the patient had any N/V/D within the last 2 months?  No  Does the patient have any non-healing wounds?  No  Has the patient had any unintentional weight loss or weight gain?  No  Has the patient had any polyphagia, polydipsia, or polyuria in the past 2 months?  No  Are you eating regular meals 3 times a day with small healthy protein snacks?  No she nibbles throughout the day Are you following a specific type of diet?  No  Do you use intermittent fasting? no  Diabetes:  Is the patient diabetic?  Yes  If diabetic, was a CBG obtained today?  Yes , it was less than 150 today  Most recent Hgb A1c?  Lab Results  Component Value Date   HGBA1C 6.7 (A) 02/27/2023   HGBA1C 6.3 (A) 09/27/2022   HGBA1C 6.5 (A) 06/27/2022   Does the patient use a CGM such as Dexcom or Freestyle Libre?  No  Did the patient bring their glucometer from home, CGM receiver or a record of their glucose readings?  No  How often do they monitor their glucose level? Twice daily, fasting and at evening time .  Are they on any insulin? no Basal, preprandial bolus or both? no Have you had any significant hypoglycemic events since your last visit (glucose <70) requiring intervention? 1 event, dropped to 69, but did not eat recently, had some orange juice and brought it back up Have you been hospitalized or visited the ER due to you  diabetes since your last office visit? none Are you experiencing any adverse side effects of your diabetes medications? None   Financial Strains and Diabetes Management:  Are you having any financial strains with devices, supplies or your medication? No .  Does the patient want to be seen by Chronic Care Management for management of their diabetes?  No  Would the patient like additional information/handouts about diabetes or any specific related topics?  No  Would the patient like to be referred to a Nutritionist? No    Diabetic Exams:  Diabetic Eye Exam: Patient has eye exam on 03/02/2023, results pending. Diabetic Foot Exam: Completed 01/18/2023    Current Medication: Outpatient Encounter Medications as of 02/27/2023  Medication Sig   acetaminophen (TYLENOL) 500 MG tablet Take 500-1,000 mg by mouth every 6 (six) hours as needed.   Alcohol Swabs (ALCOHOL PREP) PADS    alendronate (FOSAMAX) 70 MG tablet TAKE 1 TABLET BY MOUTH WEEKLY  TAKE WITH A FULL GLASS OF WATER  ON AN EMPTY STOMACH   amLODipine (NORVASC) 5 MG tablet TAKE 1 TABLET BY MOUTH IN THE  EVENING   aspirin EC 81 MG tablet Take 1 tablet (81 mg total) by mouth daily.   atorvastatin (LIPITOR) 10 MG tablet TAKE 1 TABLET BY MOUTH AT  BEDTIME FOR HIGH CHOLESTEROL   Blood Glucose Monitoring Suppl (ONETOUCH VERIO FLEX SYSTEM) w/Device KIT Use as  directed Dxb E11.65   COMBIGAN 0.2-0.5 % ophthalmic solution Apply 1 drop to eye 2 (two) times daily. Left eye   cyanocobalamin (VITAMIN B12) 1000 MCG tablet Take 1 tablet (1,000 mcg total) by mouth daily.   Ferrous Gluconate (IRON 27 PO) Take 27 mg by mouth daily.   Ferrous Sulfate (IRON) 325 (65 Fe) MG TABS Take 1 tablet by mouth daily.   folic acid (FOLVITE) 1 MG tablet Take 2 tablets (2 mg total) by mouth daily.   glimepiride (AMARYL) 4 MG tablet TAKE ONE-HALF TABLET BY MOUTH  WITH BREAKFAST AND ONE-HALF  TABLET BY MOUTH WITH DINNER   glucose blood (ONETOUCH VERIO) test strip Use 1 test  strip TWICE DAILY to check glucose with glucose meter.   hydrochlorothiazide (HYDRODIURIL) 25 MG tablet TAKE 1 TABLET BY MOUTH DAILY   Lancets (ONETOUCH DELICA PLUS LANCET33G) MISC Use 1 lancet twice daily to check glucose with glucose meter.   losartan (COZAAR) 100 MG tablet Take 1 tablet (100 mg total) by mouth daily.   methotrexate 2.5 MG tablet Take 15 mg by mouth once a week.   pantoprazole (PROTONIX) 40 MG tablet TAKE 1 TABLET BY MOUTH DAILY FOR REFLUX   pioglitazone (ACTOS) 15 MG tablet TAKE 1 TABLET BY MOUTH DAILY IN  THE MORNING   prednisoLONE acetate (PRED FORTE) 1 % ophthalmic suspension Place 1 drop into the right eye 4 (four) times daily.   traMADol (ULTRAM) 50 MG tablet Take 0.5-1 tablets (25-50 mg total) by mouth every 6 (six) hours as needed for severe pain.   No facility-administered encounter medications on file as of 02/27/2023.    Surgical History: Past Surgical History:  Procedure Laterality Date   ABDOMINAL HYSTERECTOMY     ANTERIOR VITRECTOMY Left 03/10/2020   Procedure: ANTERIOR VITRECTOMY;  Surgeon: Lockie Mola, MD;  [facility];  Service: Ophthalmology;  Laterality: Left;   CATARACT EXTRACTION     CATARACT EXTRACTION W/PHACO Left 03/10/2020   Procedure: CATARACT EXTRACTION PHACO  (IOC) LEFT  DIABETIC 16.27  02:09.7  12.5%;  Surgeon: Lockie Mola, MD;  Location: [facility];  Service: Ophthalmology;  Laterality: Left;  Diabetic - oral meds   COLONOSCOPY WITH PROPOFOL N/A 10/25/2017   Procedure: COLONOSCOPY WITH PROPOFOL;  Surgeon: Christena Deem, MD;  Location: [facility] ENDOSCOPY;  Service: Endoscopy;  Laterality: N/A;   ESOPHAGOGASTRODUODENOSCOPY (EGD) WITH PROPOFOL N/A 10/25/2017   Procedure: ESOPHAGOGASTRODUODENOSCOPY (EGD) WITH PROPOFOL;  Surgeon: Christena Deem, MD;  Location: [facility] ENDOSCOPY;  Service: Endoscopy;  Laterality: N/A;   ESOPHAGOGASTRODUODENOSCOPY (EGD) WITH PROPOFOL N/A 03/04/2019   Procedure:  ESOPHAGOGASTRODUODENOSCOPY (EGD) WITH PROPOFOL;  Surgeon: Christena Deem, MD;  Location: [facility] ENDOSCOPY;  Service: Endoscopy;  Laterality: N/A;   EUS N/A 01/17/2018   Procedure: ESOPHAGEAL ENDOSCOPIC ULTRASOUND (EUS) RADIAL;  Surgeon: Doren Custard, MD;  Location: [facility] ENDOSCOPY;  Service: Gastroenterology;  Laterality: N/A;   right side had fatty tissue taken off     TONSILLECTOMY      Medical History: Past Medical History:  Diagnosis Date   Anemia    Arthritis    Diabetes mellitus without complication (HCC)    GERD (gastroesophageal reflux disease)    Heart murmur    Hyperlipidemia    Hypertension    Leaky heart valve    Rheumatoid arthritis (HCC)    Wears dentures     Family History: Family History  Problem Relation Age of Onset   Hypertension Mother    Diabetes Father    Heart  disease Brother 69       Open heart surgery (details unknown)   Breast cancer Neg Hx     Social History   Socioeconomic History   Marital status: Widowed    Spouse name: Not on file   Number of children: Not on file   Years of education: Not on file   Highest education level: Not on file  Occupational History   Not on file  Tobacco Use   Smoking status: Never   Smokeless tobacco: Never  Vaping Use   Vaping status: Never Used  Substance and Sexual Activity   Alcohol use: No   Drug use: No   Sexual activity: Not on file  Other Topics Concern   Not on file  Social History Narrative   Not on file   Social Determinants of Health   Financial Resource Strain: Not on file  Food Insecurity: Not on file  Transportation Needs: Not on file  Physical Activity: Not on file  Stress: Not on file  Social Connections: Not on file  Intimate Partner Violence: Not on file      Review of Systems  Constitutional:  Negative for chills, fatigue and unexpected weight change.  HENT:  Negative for congestion, postnasal drip, rhinorrhea, sneezing and sore throat.   Eyes:  Positive for visual  disturbance. Negative for redness.  Respiratory:  Negative for cough, chest tightness and shortness of breath.   Cardiovascular:  Negative for chest pain and palpitations.  Gastrointestinal:  Negative for abdominal pain, constipation, diarrhea, nausea and vomiting.  Genitourinary:  Negative for dysuria and frequency.  Musculoskeletal:  Negative for arthralgias, back pain, joint swelling and neck pain.  Skin:  Negative for rash.  Neurological: Negative.  Negative for tremors and numbness.  Hematological:  Negative for adenopathy. Does not bruise/bleed easily.  Psychiatric/Behavioral:  Negative for behavioral problems (Depression), sleep disturbance and suicidal ideas. The patient is not nervous/anxious.     Vital Signs: BP 122/70   Pulse (!) 57   Temp (!) 97.2 F (36.2 C)   Resp 16   Ht 5\' 2"  (1.575 m)   Wt 125 lb 9.6 oz (57 kg)   SpO2 97%   BMI 22.97 kg/m    Physical Exam Vitals reviewed.  Constitutional:      General: She is not in acute distress.    Appearance: Normal appearance. She is normal weight. She is not ill-appearing.  HENT:     Head: Normocephalic and atraumatic.  Eyes:     Pupils: Pupils are equal, round, and reactive to light.  Cardiovascular:     Rate and Rhythm: Normal rate and regular rhythm.  Pulmonary:     Effort: Pulmonary effort is normal. No respiratory distress.  Neurological:     Mental Status: She is alert and oriented to person, place, and time.  Psychiatric:        Mood and Affect: Mood normal.        Behavior: Behavior normal.        Assessment/Plan: 1. Type 2 diabetes mellitus with other specified complication, without long-term current use of insulin (HCC) A1c has increased some, discussed diet and lifestyle modifications. Continue medications as prescribed. Follow up in 4 months for repeat A1c.  - POCT glycosylated hemoglobin (Hb A1C)  2. Essential hypertension Continue amlodipine, losartan and hydrochlorothiazide as prescribed.    3. Hyperlipidemia associated with type 2 diabetes mellitus (HCC) Continue atorvastatin as prescribed.     General Counseling: [name] verbalizes understanding of the findings  of todays visit and agrees with plan of treatment. I have discussed any further diagnostic evaluation that may be needed or ordered today. We also reviewed her medications today. she has been encouraged to call the office with any questions or concerns that should arise related to todays visit.    Orders Placed This Encounter  Procedures   POCT glycosylated hemoglobin (Hb A1C)    No orders of the defined types were placed in this encounter.   Return in about 4 months (around 06/30/2023) for F/U, Recheck A1C, Licia Harl PCP.   Total time spent:30 Minutes Time spent includes review of chart, medications, test results, and follow up plan with the patient.   [place] Controlled Substance Database was reviewed by me.  This patient was seen by Sallyanne Kuster, FNP-C in collaboration with Dr. Beverely Risen as a part of collaborative care agreement.   Nakota Elsen R. Tedd Sias, MSN, FNP-C Internal medicine

## 2023-03-02 DIAGNOSIS — H40043 Steroid responder, bilateral: Secondary | ICD-10-CM | POA: Diagnosis not present

## 2023-03-02 DIAGNOSIS — H401430 Capsular glaucoma with pseudoexfoliation of lens, bilateral, stage unspecified: Secondary | ICD-10-CM | POA: Diagnosis not present

## 2023-03-30 DIAGNOSIS — H401430 Capsular glaucoma with pseudoexfoliation of lens, bilateral, stage unspecified: Secondary | ICD-10-CM | POA: Diagnosis not present

## 2023-03-30 DIAGNOSIS — H40043 Steroid responder, bilateral: Secondary | ICD-10-CM | POA: Diagnosis not present

## 2023-04-03 DIAGNOSIS — H35352 Cystoid macular degeneration, left eye: Secondary | ICD-10-CM | POA: Diagnosis not present

## 2023-04-03 DIAGNOSIS — H35351 Cystoid macular degeneration, right eye: Secondary | ICD-10-CM | POA: Diagnosis not present

## 2023-04-16 ENCOUNTER — Ambulatory Visit: Payer: 59 | Admitting: Physician Assistant

## 2023-04-19 ENCOUNTER — Ambulatory Visit: Payer: 59 | Admitting: Medical

## 2023-04-19 ENCOUNTER — Ambulatory Visit: Payer: 59 | Attending: Physician Assistant

## 2023-04-19 ENCOUNTER — Encounter: Payer: Self-pay | Admitting: Medical

## 2023-04-19 VITALS — BP 138/58 | HR 59 | Ht 62.0 in | Wt 125.2 lb

## 2023-04-19 DIAGNOSIS — I471 Supraventricular tachycardia, unspecified: Secondary | ICD-10-CM | POA: Diagnosis not present

## 2023-04-19 DIAGNOSIS — E782 Mixed hyperlipidemia: Secondary | ICD-10-CM

## 2023-04-19 DIAGNOSIS — I38 Endocarditis, valve unspecified: Secondary | ICD-10-CM

## 2023-04-19 DIAGNOSIS — I10 Essential (primary) hypertension: Secondary | ICD-10-CM

## 2023-04-19 LAB — ECHOCARDIOGRAM COMPLETE
AR max vel: 2.65 cm2
AV Area VTI: 2.65 cm2
AV Area mean vel: 2.64 cm2
AV Mean grad: 9 mm[Hg]
AV Peak grad: 16.6 mm[Hg]
Ao pk vel: 2.04 m/s
Area-P 1/2: 2.22 cm2
Calc EF: 67.6 %
MV VTI: 2.18 cm2
P 1/2 time: 447 ms
S' Lateral: 2.6 cm
Single Plane A2C EF: 69.8 %
Single Plane A4C EF: 65.1 %

## 2023-04-19 NOTE — Progress Notes (Signed)
Cardiology Office Note:    Date:  04/19/2023   ID:  [name], DOB [birth year], MRN [id number]  PCP:  Sallyanne Kuster, NP  [facility] Cardiologist:  Yvonne Kendall, MD  [facility] Electrophysiologist:  Lanier Prude, MD   Referring MD: Sallyanne Kuster, NP   Chief Complaint: 6 month follow-up  History of Present Illness:    [name] is a 82 y.o. female with a hx of SVT, valvular heart disease including mild to moderate MR/aortic insufficiency/tricuspid regurgitation/aortic stenosis, vasovagal syncope, DM2, HTN, HLD, RA and anemia who presents for follow-up.   Echo from outside hospital showed hyperdynamic LV SF with an EF greater than 70%, moderate LVH, small left ventricle, normal RV SF, mild AS, moderate AI, mild MR, moderate TR, mild pulmonary hypertension.  Echo in October 2021 showed EF of 60 to 65%, grade 1 diastolic dysfunction, mild MR, mild AI, mild to moderate AAS.  December 2020 when she reported syncope.  Outpatient cardiac monitoring showed sinus rhythm, single 7 beat run of NSVT, 157 episodes of SVT lasting up to 26.5 seconds, occasional PACs and rare PVCs.  She was subsequently seen by EP for syncope and frequent SVT with related to take beta-blocker due to bradycardia at baseline.  It was felt SVT was not contributing to her syncopal episodes.  She was seen in the office in March 2023 reporting occasional palpitations.  She was bradycardic and hypotensive, though asymptomatic.  Repeat heart monitor showed sinus rhythm with an average heart rate of 57 bpm, occasional PACs, rare PVCs, 1 episode of NSVT lasting 6 beats, 109 atrial runs lasting up to 14.7 seconds.  She was admitted to the hospital in August 2023 with generalized weakness and hypertensive urgency.  High-sensitivity troponin was normal.  Echo showed EF of 60 to 65%, grade 1 diastolic dysfunction, mild MR, mild AI.  MR RI of the brain was unremarkable.  Following the admission she was seen  in the ER after a mechanical fall.  No syncope was reported.  Patient was last seen in May 2024 and was doing well from a cardiac perspective.  Today, the patient reports she has been having worsening vision issues. She is no longer driving. She denies chest pain, SOB, LLE, orthopnea, pnd. She has trouble sleeping sometimes. She has some stressors at home. She denies lightheadedness or dizziness. She lives with her son. She can take care of herself OK.  Past Medical History:  Diagnosis Date   Anemia    Arthritis    Diabetes mellitus without complication (HCC)    GERD (gastroesophageal reflux disease)    Heart murmur    Hyperlipidemia    Hypertension    Leaky heart valve    Rheumatoid arthritis (HCC)    Wears dentures     Past Surgical History:  Procedure Laterality Date   ABDOMINAL HYSTERECTOMY     ANTERIOR VITRECTOMY Left 03/10/2020   Procedure: ANTERIOR VITRECTOMY;  Surgeon: Lockie Mola, MD;  Location: [facility];  Service: Ophthalmology;  Laterality: Left;   CATARACT EXTRACTION     CATARACT EXTRACTION W/PHACO Left 03/10/2020   Procedure: CATARACT EXTRACTION PHACO  (IOC) LEFT  DIABETIC 16.27  02:09.7  12.5%;  Surgeon: Lockie Mola, MD;  Location: [facility];  Service: Ophthalmology;  Laterality: Left;  Diabetic - oral meds   COLONOSCOPY WITH PROPOFOL N/A 10/25/2017   Procedure: COLONOSCOPY WITH PROPOFOL;  Surgeon: Christena Deem, MD;  Location: [facility] ENDOSCOPY;  Service: Endoscopy;  Laterality: N/A;  ESOPHAGOGASTRODUODENOSCOPY (EGD) WITH PROPOFOL N/A 10/25/2017   Procedure: ESOPHAGOGASTRODUODENOSCOPY (EGD) WITH PROPOFOL;  Surgeon: Christena Deem, MD;  Location: [facility] ENDOSCOPY;  Service: Endoscopy;  Laterality: N/A;   ESOPHAGOGASTRODUODENOSCOPY (EGD) WITH PROPOFOL N/A 03/04/2019   Procedure: ESOPHAGOGASTRODUODENOSCOPY (EGD) WITH PROPOFOL;  Surgeon: Christena Deem, MD;  Location: [facility] ENDOSCOPY;  Service: Endoscopy;  Laterality: N/A;   EUS  N/A 01/17/2018   Procedure: ESOPHAGEAL ENDOSCOPIC ULTRASOUND (EUS) RADIAL;  Surgeon: Doren Custard, MD;  Location: [facility] ENDOSCOPY;  Service: Gastroenterology;  Laterality: N/A;   right side had fatty tissue taken off     TONSILLECTOMY      Current Medications: Current Meds  Medication Sig   acetaminophen (TYLENOL) 500 MG tablet Take 500-1,000 mg by mouth every 6 (six) hours as needed.   Alcohol Swabs (ALCOHOL PREP) PADS    alendronate (FOSAMAX) 70 MG tablet TAKE 1 TABLET BY MOUTH WEEKLY  TAKE WITH A FULL GLASS OF WATER  ON AN EMPTY STOMACH   amLODipine (NORVASC) 5 MG tablet TAKE 1 TABLET BY MOUTH IN THE  EVENING   aspirin EC 81 MG tablet Take 1 tablet (81 mg total) by mouth daily.   atorvastatin (LIPITOR) 10 MG tablet TAKE 1 TABLET BY MOUTH AT  BEDTIME FOR HIGH CHOLESTEROL   Blood Glucose Monitoring Suppl (ONETOUCH VERIO FLEX SYSTEM) w/Device KIT Use as directed Dxb E11.65   COMBIGAN 0.2-0.5 % ophthalmic solution Apply 1 drop to eye 2 (two) times daily. Left eye   cyanocobalamin (VITAMIN B12) 1000 MCG tablet Take 1 tablet (1,000 mcg total) by mouth daily. (Patient taking differently: Take 2,500 mcg by mouth daily.)   Ferrous Sulfate (IRON) 325 (65 Fe) MG TABS Take 1 tablet by mouth daily.   folic acid (FOLVITE) 1 MG tablet Take 2 tablets (2 mg total) by mouth daily.   glimepiride (AMARYL) 4 MG tablet TAKE ONE-HALF TABLET BY MOUTH  WITH BREAKFAST AND ONE-HALF  TABLET BY MOUTH WITH DINNER   glucose blood (ONETOUCH VERIO) test strip Use 1 test strip TWICE DAILY to check glucose with glucose meter.   hydrochlorothiazide (HYDRODIURIL) 25 MG tablet TAKE 1 TABLET BY MOUTH DAILY   Lancets (ONETOUCH DELICA PLUS LANCET33G) MISC Use 1 lancet twice daily to check glucose with glucose meter.   losartan (COZAAR) 100 MG tablet Take 1 tablet (100 mg total) by mouth daily.   methotrexate 2.5 MG tablet Take 15 mg by mouth once a week.   pantoprazole (PROTONIX) 40 MG tablet TAKE 1 TABLET BY MOUTH DAILY FOR  REFLUX   pioglitazone (ACTOS) 15 MG tablet TAKE 1 TABLET BY MOUTH DAILY IN  THE MORNING   prednisoLONE acetate (PRED FORTE) 1 % ophthalmic suspension Place 1 drop into the right eye 4 (four) times daily.     Allergies:   Patient has no known allergies.   Social History   Socioeconomic History   Marital status: Widowed    Spouse name: Not on file   Number of children: Not on file   Years of education: Not on file   Highest education level: Not on file  Occupational History   Not on file  Tobacco Use   Smoking status: Never   Smokeless tobacco: Never  Vaping Use   Vaping status: Never Used  Substance and Sexual Activity   Alcohol use: No   Drug use: No   Sexual activity: Not on file  Other Topics Concern   Not on file  Social History Narrative   Not on file  Social Determinants of Health   Financial Resource Strain: Not on file  Food Insecurity: Not on file  Transportation Needs: Not on file  Physical Activity: Not on file  Stress: Not on file  Social Connections: Not on file     Family History: The patient's family history includes Diabetes in her father; Heart disease (age of onset: 8) in her brother; Hypertension in her mother. There is no history of Breast cancer.  ROS:   Please see the history of present illness.     All other systems reviewed and are negative.  EKGs/Labs/Other Studies Reviewed:    The following studies were reviewed today:  Echo 12/2021 1. Left ventricular ejection fraction, by estimation, is 60 to 65%. The  left ventricle has normal function. The left ventricle has no regional  wall motion abnormalities. Left ventricular diastolic parameters are  consistent with Grade I diastolic  dysfunction (impaired relaxation).   2. Right ventricular systolic function is normal. The right ventricular  size is normal. There is mildly elevated pulmonary artery systolic  pressure. The estimated right ventricular systolic pressure is 42.9 mmHg.   3.  Left atrial size was moderately dilated.   4. The mitral valve is normal in structure. Mild mitral valve  regurgitation. No evidence of mitral stenosis. Moderate mitral annular  calcification.   5. The aortic valve is normal in structure. Aortic valve regurgitation is  mild. No aortic stenosis is present.   6. The inferior vena cava is normal in size with greater than 50%  respiratory variability, suggesting right atrial pressure of 3 mmHg.    Heart monitor 08/2021  The patient was monitored for 14 days. The predominant rhythm was sinus with an average rate of 57 bpm (range 42-106 bpm in sinus). There were occasional PAC's (burden 1.1%) and rare PVC's. One episode of nonsustained ventricular tachycardia occurred, lasting 6 beats with a maximum rate of 116 bpm. There were 109 atrial runs lasting up to 14.7 seconds with a maximum rate of 193 bpm. No sustained arrhythmia or prolonged pause occurred. There were no patient triggered events.   Predominantly sinus rhythm with occasional PAC's and rare PVC's.  Multiple episodes of PSVT occurred, as well as a single brief run of NSVT.  EKG:  EKG is ordered today.  The ekg ordered today demonstrates SB 59bpm, LAD, nonspecific ST changes  Recent Labs: 12/18/2022: ALT 14; BUN 24; Creatinine, Ser 1.04; Hemoglobin 12.1; Platelets 157; Potassium 3.5; Sodium 140  Recent Lipid Panel    Component Value Date/Time   CHOL 228 (H) 12/18/2022 1251   CHOL 228 (H) 07/25/2018 1624   TRIG 73 12/18/2022 1251   HDL 82 12/18/2022 1251   HDL 92 07/25/2018 1624   CHOLHDL 2.8 12/18/2022 1251   VLDL 15 12/18/2022 1251   LDLCALC 131 (H) 12/18/2022 1251   LDLCALC 120 (H) 07/25/2018 1624     Physical Exam:    VS:  BP (!) 138/58 (BP Location: Left Arm, Patient Position: Sitting, Cuff Size: Normal)   Pulse (!) 59   Ht 5\' 2"  (1.575 m)   Wt 125 lb 3.2 oz (56.8 kg)   SpO2 99%   BMI 22.90 kg/m     Wt Readings from Last 3 Encounters:  04/19/23 125 lb 3.2 oz  (56.8 kg)  02/27/23 125 lb 9.6 oz (57 kg)  11/23/22 126 lb 9.6 oz (57.4 kg)     GEN:  Well nourished, well developed in no acute distress HEENT: Normal NECK: No JVD; No  carotid bruits LYMPHATICS: No lymphadenopathy CARDIAC: RRR, + murmur, no rubs, gallops RESPIRATORY:  Clear to auscultation without rales, wheezing or rhonchi  ABDOMEN: Soft, non-tender, non-distended MUSCULOSKELETAL:  No edema; No deformity  SKIN: Warm and dry NEUROLOGIC:  Alert and oriented x 3 PSYCHIATRIC:  Normal affect   ASSESSMENT:    1. SVT (supraventricular tachycardia) (HCC)   2. Valvular heart disease   3. Essential hypertension   4. Hyperlipidemia, mixed    PLAN:    In order of problems listed above:  pSVT Patient denies heart racing or palpitations. Not on rate lowering medication due to baseline bradycardia.   Valvular heart disease  Echo from 2023 showed LVEF 60-65%, G1DD, mild MR, moderate valve calcification, mild AI. Echo performed today, results pending.  HTN BP mildly elevated today. She reports BP is normal at home. Continue amlodipine 5mg  daily, hydrochlorothiazide 25mg  daily, Losartan 100mg  daily.   HLD LDL 131, total chol 228, TG 73, HDL 82. Continue Lipitor 10mg  daily.   Disposition: Follow up in 6 month(s) with MD/APP    Signed, Pedro Whiters David Stall, PA-C  04/19/2023 2:47 PM    [facility]

## 2023-04-19 NOTE — Patient Instructions (Signed)
Medication Instructions:  No changes at this time.   *If you need a refill on your cardiac medications before your next appointment, please call your pharmacy*   Lab Work: None  If you have labs (blood work) drawn today and your tests are completely normal, you will receive your results only by: MyChart Message (if you have MyChart) OR A paper copy in the mail If you have any lab test that is abnormal or we need to change your treatment, we will call you to review the results.   Testing/Procedures: None   Follow-Up: At [facility], you and your health needs are our priority.  As part of our continuing mission to provide you with exceptional heart care, we have created designated Provider Care Teams.  These Care Teams include your primary Cardiologist (physician) and Advanced Practice Providers (APPs -  Physician Assistants and Nurse Practitioners) who all work together to provide you with the care you need, when you need it.   Your next appointment:   6 month(s)  Provider:   Yvonne Kendall, MD or Cadence Fransico Michael, [place]

## 2023-04-26 ENCOUNTER — Other Ambulatory Visit: Payer: Self-pay | Admitting: Nurse Practitioner

## 2023-04-26 DIAGNOSIS — E1165 Type 2 diabetes mellitus with hyperglycemia: Secondary | ICD-10-CM

## 2023-05-01 DIAGNOSIS — H35352 Cystoid macular degeneration, left eye: Secondary | ICD-10-CM | POA: Diagnosis not present

## 2023-05-02 ENCOUNTER — Other Ambulatory Visit: Payer: Self-pay | Admitting: Nurse Practitioner

## 2023-05-02 DIAGNOSIS — Z1231 Encounter for screening mammogram for malignant neoplasm of breast: Secondary | ICD-10-CM

## 2023-05-15 ENCOUNTER — Other Ambulatory Visit: Payer: Self-pay | Admitting: Nurse Practitioner

## 2023-05-15 DIAGNOSIS — E1165 Type 2 diabetes mellitus with hyperglycemia: Secondary | ICD-10-CM

## 2023-05-23 ENCOUNTER — Other Ambulatory Visit: Payer: Self-pay | Admitting: Nurse Practitioner

## 2023-05-23 DIAGNOSIS — E1165 Type 2 diabetes mellitus with hyperglycemia: Secondary | ICD-10-CM

## 2023-05-25 DIAGNOSIS — H40003 Preglaucoma, unspecified, bilateral: Secondary | ICD-10-CM | POA: Diagnosis not present

## 2023-06-01 DIAGNOSIS — H40043 Steroid responder, bilateral: Secondary | ICD-10-CM | POA: Diagnosis not present

## 2023-06-01 DIAGNOSIS — H401430 Capsular glaucoma with pseudoexfoliation of lens, bilateral, stage unspecified: Secondary | ICD-10-CM | POA: Diagnosis not present

## 2023-06-26 ENCOUNTER — Ambulatory Visit
Admission: RE | Admit: 2023-06-26 | Discharge: 2023-06-26 | Disposition: A | Discharge status: Home / Self Care | Payer: 59 | Source: Ambulatory Visit | Attending: Nurse Practitioner | Admitting: Nurse Practitioner

## 2023-06-26 DIAGNOSIS — E119 Type 2 diabetes mellitus without complications: Secondary | ICD-10-CM | POA: Diagnosis not present

## 2023-06-26 DIAGNOSIS — B351 Tinea unguium: Secondary | ICD-10-CM | POA: Diagnosis not present

## 2023-06-26 DIAGNOSIS — Z1231 Encounter for screening mammogram for malignant neoplasm of breast: Secondary | ICD-10-CM | POA: Insufficient documentation

## 2023-06-26 DIAGNOSIS — L851 Acquired keratosis [keratoderma] palmaris et plantaris: Secondary | ICD-10-CM | POA: Diagnosis not present

## 2023-07-02 ENCOUNTER — Ambulatory Visit (INDEPENDENT_AMBULATORY_CARE_PROVIDER_SITE_OTHER): Payer: 59 | Admitting: Nurse Practitioner

## 2023-07-02 ENCOUNTER — Encounter: Payer: Self-pay | Admitting: Nurse Practitioner

## 2023-07-02 VITALS — BP 128/60 | HR 62 | Temp 97.5°F | Resp 16 | Ht 62.0 in | Wt 122.2 lb

## 2023-07-02 DIAGNOSIS — Z23 Encounter for immunization: Secondary | ICD-10-CM

## 2023-07-02 DIAGNOSIS — H35351 Cystoid macular degeneration, right eye: Secondary | ICD-10-CM | POA: Diagnosis not present

## 2023-07-02 DIAGNOSIS — E1169 Type 2 diabetes mellitus with other specified complication: Secondary | ICD-10-CM | POA: Diagnosis not present

## 2023-07-02 DIAGNOSIS — H35352 Cystoid macular degeneration, left eye: Secondary | ICD-10-CM | POA: Diagnosis not present

## 2023-07-02 DIAGNOSIS — H401430 Capsular glaucoma with pseudoexfoliation of lens, bilateral, stage unspecified: Secondary | ICD-10-CM | POA: Diagnosis not present

## 2023-07-02 DIAGNOSIS — I10 Essential (primary) hypertension: Secondary | ICD-10-CM

## 2023-07-02 DIAGNOSIS — E785 Hyperlipidemia, unspecified: Secondary | ICD-10-CM | POA: Diagnosis not present

## 2023-07-02 DIAGNOSIS — Z961 Presence of intraocular lens: Secondary | ICD-10-CM | POA: Diagnosis not present

## 2023-07-02 LAB — POCT GLYCOSYLATED HEMOGLOBIN (HGB A1C): Hemoglobin A1C: 6.4 % — AB (ref 4.0–5.6)

## 2023-07-02 MED ORDER — PNEUMOCOCCAL 20-VAL CONJ VACC 0.5 ML IM SUSY
0.5000 mL | PREFILLED_SYRINGE | Freq: Once | INTRAMUSCULAR | 0 refills | Status: DC | PRN
Start: 2023-07-02 — End: 2023-12-27

## 2023-07-02 NOTE — Progress Notes (Signed)
[facility] [address] [place], [place] [address]  Internal MEDICINE  Office Visit Note  Patient Name: [name]  [id number]  [id number]  Date of Service: 07/02/2023  Chief Complaint  Patient presents with   Diabetes   Gastroesophageal Reflux   Hypertension   Hyperlipidemia   Follow-up    HPI [name] presents for a follow-up visit for diabetes, hypertension and high cholesterol.  Diabetes -- A1c improved to 6.4 from prior level of 6.7 in October.  Hypertension -- elevated BP, rechecked and improved.  High cholesterol -- on statin therapy.  Due for pneumonia vaccine     Current Medication: Outpatient Encounter Medications as of 07/02/2023  Medication Sig   acetaminophen (TYLENOL) 500 MG tablet Take 500-1,000 mg by mouth every 6 (six) hours as needed.   Alcohol Swabs (ALCOHOL PREP) PADS    alendronate (FOSAMAX) 70 MG tablet TAKE 1 TABLET BY MOUTH WEEKLY  TAKE WITH A FULL GLASS OF WATER  ON AN EMPTY STOMACH   amLODipine (NORVASC) 5 MG tablet TAKE 1 TABLET BY MOUTH IN THE  EVENING   aspirin EC 81 MG tablet Take 1 tablet (81 mg total) by mouth daily.   atorvastatin (LIPITOR) 10 MG tablet TAKE 1 TABLET BY MOUTH AT  BEDTIME FOR HIGH CHOLESTEROL   Blood Glucose Monitoring Suppl (ONETOUCH VERIO FLEX SYSTEM) w/Device KIT Use as directed Dxb E11.65   COMBIGAN 0.2-0.5 % ophthalmic solution Apply 1 drop to eye 2 (two) times daily. Left eye   cyanocobalamin (VITAMIN B12) 1000 MCG tablet Take 1 tablet (1,000 mcg total) by mouth daily. (Patient taking differently: Take 2,500 mcg by mouth daily.)   Ferrous Gluconate (IRON 27 PO) Take 27 mg by mouth daily.   Ferrous Sulfate (IRON) 325 (65 Fe) MG TABS Take 1 tablet by mouth daily.   folic acid (FOLVITE) 1 MG tablet Take 2 tablets (2 mg total) by mouth daily.   glimepiride (AMARYL) 4 MG tablet TAKE ONE-HALF TABLET BY MOUTH  WITH BREAKFAST AND ONE-HALF  TABLET BY MOUTH WITH DINNER   glucose blood (ONETOUCH VERIO) test strip  CHECK BLOOD GLUCOSE TWICE DAILY   hydrochlorothiazide (HYDRODIURIL) 25 MG tablet TAKE 1 TABLET BY MOUTH DAILY   Lancets (ONETOUCH DELICA PLUS LANCET33G) MISC CHECK BLOOD GLUCOSE TWICE DAILY   losartan (COZAAR) 100 MG tablet Take 1 tablet (100 mg total) by mouth daily.   methotrexate 2.5 MG tablet Take 15 mg by mouth once a week.   pantoprazole (PROTONIX) 40 MG tablet TAKE 1 TABLET BY MOUTH DAILY FOR REFLUX   pioglitazone (ACTOS) 15 MG tablet TAKE 1 TABLET BY MOUTH DAILY IN  THE MORNING   pneumococcal 20-valent conjugate vaccine (PREVNAR 20) 0.5 ML injection Inject 0.5 mLs into the muscle once as needed for up to 1 dose for immunization.   prednisoLONE acetate (PRED FORTE) 1 % ophthalmic suspension Place 1 drop into the right eye 4 (four) times daily.   traMADol (ULTRAM) 50 MG tablet Take 0.5-1 tablets (25-50 mg total) by mouth every 6 (six) hours as needed for severe pain.   No facility-administered encounter medications on file as of 07/02/2023.    Surgical History: Past Surgical History:  Procedure Laterality Date   ABDOMINAL HYSTERECTOMY     ANTERIOR VITRECTOMY Left 03/10/2020   Procedure: ANTERIOR VITRECTOMY;  Surgeon: Lockie Mola, MD;  Location: [facility];  Service: Ophthalmology;  Laterality: Left;   CATARACT EXTRACTION     CATARACT EXTRACTION W/PHACO Left 03/10/2020   Procedure: CATARACT EXTRACTION  PHACO  (IOC) LEFT  DIABETIC 16.27  02:09.7  12.5%;  Surgeon: Lockie Mola, MD;  Location: [facility];  Service: Ophthalmology;  Laterality: Left;  Diabetic - oral meds   COLONOSCOPY WITH PROPOFOL N/A 10/25/2017   Procedure: COLONOSCOPY WITH PROPOFOL;  Surgeon: Christena Deem, MD;  Location: [facility] ENDOSCOPY;  Service: Endoscopy;  Laterality: N/A;   ESOPHAGOGASTRODUODENOSCOPY (EGD) WITH PROPOFOL N/A 10/25/2017   Procedure: ESOPHAGOGASTRODUODENOSCOPY (EGD) WITH PROPOFOL;  Surgeon: Christena Deem, MD;  Location: [facility] ENDOSCOPY;  Service: Endoscopy;   Laterality: N/A;   ESOPHAGOGASTRODUODENOSCOPY (EGD) WITH PROPOFOL N/A 03/04/2019   Procedure: ESOPHAGOGASTRODUODENOSCOPY (EGD) WITH PROPOFOL;  Surgeon: Christena Deem, MD;  Location: [facility] ENDOSCOPY;  Service: Endoscopy;  Laterality: N/A;   EUS N/A 01/17/2018   Procedure: ESOPHAGEAL ENDOSCOPIC ULTRASOUND (EUS) RADIAL;  Surgeon: Doren Custard, MD;  Location: [facility] ENDOSCOPY;  Service: Gastroenterology;  Laterality: N/A;   right side had fatty tissue taken off     TONSILLECTOMY      Medical History: Past Medical History:  Diagnosis Date   Anemia    Arthritis    Diabetes mellitus without complication (HCC)    GERD (gastroesophageal reflux disease)    Heart murmur    Hyperlipidemia    Hypertension    Leaky heart valve    Rheumatoid arthritis (HCC)    Wears dentures     Family History: Family History  Problem Relation Age of Onset   Hypertension Mother    Diabetes Father    Heart disease Brother 52       Open heart surgery (details unknown)   Breast cancer Neg Hx     Social History   Socioeconomic History   Marital status: Widowed    Spouse name: Not on file   Number of children: Not on file   Years of education: Not on file   Highest education level: Not on file  Occupational History   Not on file  Tobacco Use   Smoking status: Never   Smokeless tobacco: Never  Vaping Use   Vaping status: Never Used  Substance and Sexual Activity   Alcohol use: No   Drug use: No   Sexual activity: Not on file  Other Topics Concern   Not on file  Social History Narrative   Not on file   Social Drivers of Health   Financial Resource Strain: Not on file  Food Insecurity: Not on file  Transportation Needs: Not on file  Physical Activity: Not on file  Stress: Not on file  Social Connections: Not on file  Intimate Partner Violence: Not on file      Review of Systems  Constitutional:  Negative for chills, fatigue and unexpected weight change.  HENT:  Negative for  congestion, postnasal drip, rhinorrhea, sneezing and sore throat.   Eyes:  Negative for redness and visual disturbance.  Respiratory:  Negative for cough, chest tightness and shortness of breath.   Cardiovascular:  Negative for chest pain and palpitations.  Gastrointestinal:  Negative for abdominal pain, constipation, diarrhea, nausea and vomiting.  Genitourinary:  Negative for dysuria and frequency.  Musculoskeletal:  Negative for arthralgias, back pain, joint swelling and neck pain.  Skin:  Negative for rash.  Neurological: Negative.  Negative for tremors and numbness.  Hematological:  Negative for adenopathy. Does not bruise/bleed easily.  Psychiatric/Behavioral:  Negative for behavioral problems (Depression), sleep disturbance and suicidal ideas. The patient is not nervous/anxious.     Vital Signs: BP 128/60 Comment: 150/60  Pulse 62  Temp (!) 97.5 F (36.4 C)   Resp 16   Ht 5\' 2"  (1.575 m)   Wt 122 lb 3.2 oz (55.4 kg)   SpO2 97%   BMI 22.35 kg/m    Physical Exam Vitals reviewed.  Constitutional:      General: She is not in acute distress.    Appearance: Normal appearance. She is normal weight. She is not ill-appearing.  HENT:     Head: Normocephalic and atraumatic.  Eyes:     Pupils: Pupils are equal, round, and reactive to light.  Cardiovascular:     Rate and Rhythm: Normal rate and regular rhythm.  Pulmonary:     Effort: Pulmonary effort is normal. No respiratory distress.  Neurological:     Mental Status: She is alert and oriented to person, place, and time.  Psychiatric:        Mood and Affect: Mood normal.        Behavior: Behavior normal.        Assessment/Plan: 1. Type 2 diabetes mellitus with other specified complication, without long-term current use of insulin (HCC) (Primary) Continue actos and glimepiride as prescribed. A1c is stable. And improving  - POCT glycosylated hemoglobin (Hb A1C)  2. Essential hypertension Stable, continue losartan,  hydrochlorothiazide and amlodipine as prescribed.   3. Hyperlipidemia associated with type 2 diabetes mellitus (HCC) Continue atorvastatin as prescribed.   4. Need for vaccination - pneumococcal 20-valent conjugate vaccine (PREVNAR 20) 0.5 ML injection; Inject 0.5 mLs into the muscle once as needed for up to 1 dose for immunization.  Dispense: 0.5 mL; Refill: 0   General Counseling: [name] verbalizes understanding of the findings of todays visit and agrees with plan of treatment. I have discussed any further diagnostic evaluation that may be needed or ordered today. We also reviewed her medications today. she has been encouraged to call the office with any questions or concerns that should arise related to todays visit.    Orders Placed This Encounter  Procedures   POCT glycosylated hemoglobin (Hb A1C)    Meds ordered this encounter  Medications   pneumococcal 20-valent conjugate vaccine (PREVNAR 20) 0.5 ML injection    Sig: Inject 0.5 mLs into the muscle once as needed for up to 1 dose for immunization.    Dispense:  0.5 mL    Refill:  0    Patient due for prevnar 20 vaccination.    Return for previously scheduled, AWV, [name] PCP in june .   Total time spent:30 Minutes Time spent includes review of chart, medications, test results, and follow up plan with the patient.   [place] Controlled Substance Database was reviewed by me.  This patient was seen by Sallyanne Kuster, FNP-C in collaboration with Dr. Beverely Risen as a part of collaborative care agreement.   Caitriona Sundquist R. Tedd Sias, MSN, FNP-C Internal medicine

## 2023-07-19 ENCOUNTER — Other Ambulatory Visit: Payer: Self-pay | Admitting: Physician Assistant

## 2023-07-19 DIAGNOSIS — I10 Essential (primary) hypertension: Secondary | ICD-10-CM

## 2023-08-15 DIAGNOSIS — M0579 Rheumatoid arthritis with rheumatoid factor of multiple sites without organ or systems involvement: Secondary | ICD-10-CM | POA: Diagnosis not present

## 2023-08-15 DIAGNOSIS — Z8781 Personal history of (healed) traumatic fracture: Secondary | ICD-10-CM | POA: Diagnosis not present

## 2023-08-15 DIAGNOSIS — Z79899 Other long term (current) drug therapy: Secondary | ICD-10-CM | POA: Diagnosis not present

## 2023-08-18 ENCOUNTER — Other Ambulatory Visit: Payer: Self-pay | Admitting: Nurse Practitioner

## 2023-08-25 ENCOUNTER — Other Ambulatory Visit: Payer: Self-pay | Admitting: Nurse Practitioner

## 2023-08-25 DIAGNOSIS — K219 Gastro-esophageal reflux disease without esophagitis: Secondary | ICD-10-CM

## 2023-08-25 DIAGNOSIS — E1165 Type 2 diabetes mellitus with hyperglycemia: Secondary | ICD-10-CM

## 2023-08-25 DIAGNOSIS — I10 Essential (primary) hypertension: Secondary | ICD-10-CM

## 2023-10-09 DIAGNOSIS — H401430 Capsular glaucoma with pseudoexfoliation of lens, bilateral, stage unspecified: Secondary | ICD-10-CM | POA: Diagnosis not present

## 2023-10-09 DIAGNOSIS — H35351 Cystoid macular degeneration, right eye: Secondary | ICD-10-CM | POA: Diagnosis not present

## 2023-10-09 DIAGNOSIS — Z961 Presence of intraocular lens: Secondary | ICD-10-CM | POA: Diagnosis not present

## 2023-10-09 DIAGNOSIS — H35352 Cystoid macular degeneration, left eye: Secondary | ICD-10-CM | POA: Diagnosis not present

## 2023-10-09 DIAGNOSIS — H209 Unspecified iridocyclitis: Secondary | ICD-10-CM | POA: Diagnosis not present

## 2023-10-17 ENCOUNTER — Ambulatory Visit: Payer: 59 | Admitting: Internal Medicine

## 2023-10-17 NOTE — Progress Notes (Addendum)
 Cardiology Clinic Note   Date: 10/18/2023 ID: [name], [name] May 08, 1941, MRN [id number]  Primary Cardiologist:  Sammy [name], MD  Chief Complaint   [name] is a 83 y.o. female who presents to the clinic today for routine follow up.   Patient Profile   [name] is followed by Dr. Nolan Battle for the history outlined below.      Past medical history significant for: PSVT. 14-day ZIO 09/12/2021: HR 42 to 106 bpm, average 57 bpm.  Predominantly sinus rhythm.  Occasional PACs 1.1% burden.  Rare PVCs.  1 episode of NSVT lasting 6 beats max rate 160 bpm.  109 atrial runs lasting 14.7 seconds with max rate 193 bpm.  No sustained arrhythmias or prolonged pauses.  No patient triggered events. Valvular heart disease. Echo 04/19/2023: EF 60 to 65%.  No RWMA.  Mild LVH.  Moderate asymmetric basal septal hypertrophy.  LVOT gradient 35 mmHg.  Grade I DD.  Normal RV size/function.  Mildly elevated PA pressure, RVSP 43.9 mmHg.  Mild MR.  Moderate MAC.  Mild to moderate TR.  Mild AI.  Aortic valve sclerosis without stenosis.  Mild LAE. Hypertension. Hyperlipidemia. Lipid panel 12/18/2022: LDL 131, HDL 82, TG 73, total 228. T2DM. CKD stage IIIa. Vasovagal syncope. Anemia. RA.  In summary, prior echo from outside office demonstrated hyperdynamic LV function with EF > 70%, moderate LVH, small left ventricle, normal RV size/function, mild AS, moderate AI, mild MR, moderate TR, mild pulmonary hypertension.  She establish care with Dr. Nolan Battle in September 2020.  Repeat echo in October 2021 showed EF 60 to 65%, no RWMA, Grade I DD, mild LVH, normal RV size/function, mildly elevated PA pressure RVSP 42.2 mmHg, mild MR, mild AI, mild to moderate aortic valve sclerosis without stenosis.  In December 2021 she complained of intermittent palpitations lasting 1 to 2 seconds without associated symptoms.  She was seen in February 2022 for follow-up after a syncopal episode in late December 2021.   Outpatient cardiac monitoring demonstrated predominantly sinus rhythm with an average rate of 66 bpm, 1 episode of NSVT for 7 beats with max rate 152 bpm, 157 episodes of SVT lasting up to 26.5 seconds with max rate of 207 bpm, occasional PACs with 1.8% burden, rare PVCs and no patient triggered events.  She was evaluated by EP for syncope and frequent episodes of SVT with inability to tolerate BB secondary to baseline bradycardia.  EP felt syncopal episodes consistent with vasovagal etiology.  Pharmacologic therapy was deferred secondary to patient's lack of symptoms during runs of SVT and not related to syncopal episode.  Repeat outpatient cardiac monitoring in April 2023 demonstrated continued runs of SVT as detailed above.  She underwent hospital admission in August 2023 for generalized weakness and hypertensive urgency.  Troponin was negative.  Echo demonstrated EF 60 to 65%, no RWMA, Grade I DD, normal RV size/function, mildly elevated PA pressure RVSP 42.9 mmHg, moderate LAE, mild MR with moderate MAC, mild AI.  MRI was negative for acute stroke or abnormality.  Following that hospital admission she was evaluated in the ED for mechanical fall sustaining L1 compression fracture.  Patient was last seen in the office by Cadence Furth, PA-C on 04/19/2023 for routine follow-up.  She complained of worsening vision issues and was no longer driving.  BP was mildly elevated in the office but reported as normal at home.  No medication changes were made.     History of Present Illness    Today,  patient is accompanied by her son. She reports doing well. Patient denies shortness of breath, dyspnea on exertion, lower extremity edema, orthopnea or PND. No chest pain, pressure, or tightness. She reports occasional palpitations particularly at night. They do not last long and do not disturb her sleep. She states "now that I know what they are I just ignore them." She has been dealing with back pain since her fall in  August 2023. She takes an occasional Tylenol  for the pain. She lives with her son. She is able to perform light household chores as long as she paces herself.     ROS: All other systems reviewed and are otherwise negative except as noted in History of Present Illness.  EKGs/Labs Reviewed    EKG Interpretation Date/Time:  Thursday Oct 18 2023 08:22:23 EDT Ventricular Rate:  52 PR Interval:  160 QRS Duration:  96 QT Interval:  472 QTC Calculation: 438 R Axis:   -23  Text Interpretation: Sinus bradycardia with sinus arrhythmia Left ventricular hypertrophy with repolarization abnormality ( R in aVL ) When compared with ECG of 19-Apr-2023 13:33, No significant change was found Confirmed by Morey Ar [phone]) on 10/18/2023 8:33:21 AM   12/18/2022: ALT 14; AST 22; BUN 24; Creatinine, Ser 1.04; Potassium 3.5; Sodium 140   12/18/2022: Hemoglobin 12.1; WBC 5.1    Physical Exam    VS:  BP (!) 118/50 (BP Location: Left Arm, Patient Position: Sitting, Cuff Size: Normal)   Pulse (!) 52   Ht 5\' 2"  (1.575 m)   Wt 125 lb (56.7 kg)   BMI 22.86 kg/m  , BMI Body mass index is 22.86 kg/m.  GEN: Well nourished, well developed, in no acute distress. Neck: No JVD or carotid bruits. Cardiac:  RRR. 2/6 systolic murmur. No rubs or gallops.   Respiratory:  Respirations regular and unlabored. Clear to auscultation without rales, wheezing or rhonchi. GI: Soft, nontender, nondistended. Extremities: Radials/DP/PT 2+ and equal bilaterally. No clubbing or cyanosis. No edema.  Skin: Warm and dry, no rash. Neuro: Strength intact.  Assessment & Plan   PSVT/bradycardia 14-day ZIO April 2023 demonstrated HR 42 to 106 bpm, average 57 bpm, predominantly sinus rhythm, occasional PACs 1.1% burden, rare PVCs, 1 episode of NSVT lasting 6 beats max rate 160 bpm, 109 atrial runs lasting 14.7 seconds with max rate 193 bpm.  Patient reports occasional palpitations typically at night. They do not last long and are  not bothersome to her.  Not on beta-blocker secondary to baseline bradycardia. EKG demonstrates sinus bradycardia with sinus arrhythmia 52 bpm.  - Repeat outpatient monitoring as clinically indicated.  Valvular heart disease Echo November 2024 demonstrated normal LV/RV function, mild LVH, Grade I DD, mildly elevated PA pressure, mild MR, mild to moderate TR, mild AI.  Patient denies shortness of breath, lightheadedness, dizziness, presyncope or syncope. She has no lower extremity edema. 2/6 systolic murmur on exam today.  - Repeat echo as clinically indicated.  Hypertension BP today 118/50. She denies headaches or dizziness.  - Continue amlodipine , hydrochlorothiazide , losartan .  Hyperlipidemia LDL 131 July 2024. - Continue atorvastatin . - Continue to follow with PCP.  Disposition: Return in 6 months or sooner as needed.          Signed, Lonell Rives. Jhonny Calixto, DNP, NP-C

## 2023-10-17 NOTE — Progress Notes (Deleted)
  Cardiology Office Note:  .   Date:  10/17/2023  ID:  [name], DOB [birth year], MRN [id number] PCP: Laurence Pons, NP  [facility] Providers Cardiologist:  Sammy [name], MD Electrophysiologist:  Boyce Byes, MD { Click to update primary MD,subspecialty MD or APP then REFRESH:1}    History of Present Illness: [name] is a 83 y.o. female with history of supraventricular tachycardia, multi valvular disease, hypertension, hyperlipidemia, type 2 diabetes mellitus, rheumatoid arthritis, and anemia, who presents for valvular heart disease.  She was last seen in 03/2023 by Cadence Furth, PA, at which time she was doing okay from a heart standpoint but was concerned by worsening vision.  Repeat echocardiogram performed that day showed mild to moderate regurgitant disease of multiple valves and moderate pulmonary hypertension (see details below).  No medication changes or additional testing were pursued.  ROS: See HPI  Studies Reviewed: Aaron Aas        TTE (04/19/2023): Normal LV size with mild LVH.  LVEF 60-65% with normal wall motion and grade 1 diastolic dysfunction.  Normal RV size and wall thickness.  Mild to moderate pulmonary hypertension (RVSP 44 mmHg).  Mild left atrial enlargement.  No pericardial effusion.  Moderate mitral annular calcification with mild regurgitation.  Mild to moderate tricuspid regurgitation.  Sclerotic aortic valve with mild regurgitation.  Normal CVP.  Risk Assessment/Calculations:   {Does this patient have ATRIAL FIBRILLATION?:[id number]} No BP recorded.  {Refresh Note OR Click here to enter BP  :1}***       Physical Exam:   VS:  There were no vitals taken for this visit.   Wt Readings from Last 3 Encounters:  07/02/23 122 lb 3.2 oz (55.4 kg)  04/19/23 125 lb 3.2 oz (56.8 kg)  02/27/23 125 lb 9.6 oz (57 kg)    General:  NAD. Neck: No JVD or HJR. Lungs: Clear to auscultation bilaterally without wheezes or crackles. Heart:  Regular rate and rhythm without murmurs, rubs, or gallops. Abdomen: Soft, nontender, nondistended. Extremities: No lower extremity edema.  ASSESSMENT AND PLAN: .    ***    {Are you ordering a CV Procedure (e.g. stress test, cath, DCCV, TEE, etc)?   Press F2        :[id number]}  Dispo: ***  Signed, Sammy [name], MD

## 2023-10-18 ENCOUNTER — Ambulatory Visit: Attending: Student | Admitting: Student

## 2023-10-18 ENCOUNTER — Encounter: Payer: Self-pay | Admitting: Student

## 2023-10-18 VITALS — BP 118/50 | HR 52 | Ht 62.0 in | Wt 125.0 lb

## 2023-10-18 DIAGNOSIS — R001 Bradycardia, unspecified: Secondary | ICD-10-CM

## 2023-10-18 DIAGNOSIS — I10 Essential (primary) hypertension: Secondary | ICD-10-CM | POA: Diagnosis not present

## 2023-10-18 DIAGNOSIS — E782 Mixed hyperlipidemia: Secondary | ICD-10-CM | POA: Diagnosis not present

## 2023-10-18 DIAGNOSIS — I38 Endocarditis, valve unspecified: Secondary | ICD-10-CM | POA: Diagnosis not present

## 2023-10-18 DIAGNOSIS — I471 Supraventricular tachycardia, unspecified: Secondary | ICD-10-CM | POA: Diagnosis not present

## 2023-10-18 NOTE — Patient Instructions (Signed)
 Medication Instructions:  No changes at this time.   *If you need a refill on your cardiac medications before your next appointment, please call your pharmacy*  Lab Work: None  If you have labs (blood work) drawn today and your tests are completely normal, you will receive your results only by: MyChart Message (if you have MyChart) OR A paper copy in the mail If you have any lab test that is abnormal or we need to change your treatment, we will call you to review the results.  Testing/Procedures: None  Follow-Up: At [facility], you and your health needs are our priority.  As part of our continuing mission to provide you with exceptional heart care, our providers are all part of one team.  This team includes your primary Cardiologist (physician) and Advanced Practice Providers or APPs (Physician Assistants and Nurse Practitioners) who all work together to provide you with the care you need, when you need it.  Your next appointment:   6 month(s)  Provider:   Sammy [name], MD or Morey Ar, NP

## 2023-11-06 DIAGNOSIS — H35352 Cystoid macular degeneration, left eye: Secondary | ICD-10-CM | POA: Diagnosis not present

## 2023-11-26 ENCOUNTER — Ambulatory Visit: Payer: 59 | Admitting: Nurse Practitioner

## 2023-12-17 DIAGNOSIS — Z79899 Other long term (current) drug therapy: Secondary | ICD-10-CM | POA: Diagnosis not present

## 2023-12-17 DIAGNOSIS — M0579 Rheumatoid arthritis with rheumatoid factor of multiple sites without organ or systems involvement: Secondary | ICD-10-CM | POA: Diagnosis not present

## 2023-12-18 DIAGNOSIS — H35353 Cystoid macular degeneration, bilateral: Secondary | ICD-10-CM | POA: Diagnosis not present

## 2023-12-25 ENCOUNTER — Telehealth: Payer: Self-pay | Admitting: Nurse Practitioner

## 2023-12-25 NOTE — Telephone Encounter (Signed)
 Left vm to confirm 12/27/23 appointment-Toni

## 2023-12-27 ENCOUNTER — Ambulatory Visit (INDEPENDENT_AMBULATORY_CARE_PROVIDER_SITE_OTHER): Admitting: Nurse Practitioner

## 2023-12-27 ENCOUNTER — Encounter: Payer: Self-pay | Admitting: Nurse Practitioner

## 2023-12-27 VITALS — BP 132/70 | HR 75 | Temp 97.5°F | Resp 16 | Ht 62.0 in | Wt 124.8 lb

## 2023-12-27 DIAGNOSIS — I152 Hypertension secondary to endocrine disorders: Secondary | ICD-10-CM

## 2023-12-27 DIAGNOSIS — E1169 Type 2 diabetes mellitus with other specified complication: Secondary | ICD-10-CM | POA: Diagnosis not present

## 2023-12-27 DIAGNOSIS — E785 Hyperlipidemia, unspecified: Secondary | ICD-10-CM | POA: Diagnosis not present

## 2023-12-27 DIAGNOSIS — Z0000 Encounter for general adult medical examination without abnormal findings: Secondary | ICD-10-CM

## 2023-12-27 DIAGNOSIS — E1159 Type 2 diabetes mellitus with other circulatory complications: Secondary | ICD-10-CM

## 2023-12-27 DIAGNOSIS — M810 Age-related osteoporosis without current pathological fracture: Secondary | ICD-10-CM

## 2023-12-27 LAB — POCT GLYCOSYLATED HEMOGLOBIN (HGB A1C): Hemoglobin A1C: 6.7 % — AB (ref 4.0–5.6)

## 2023-12-27 MED ORDER — FOLIC ACID 1 MG PO TABS
2.0000 mg | ORAL_TABLET | Freq: Every day | ORAL | 3 refills | Status: AC
Start: 2023-12-27 — End: ?

## 2023-12-27 MED ORDER — LOSARTAN POTASSIUM 100 MG PO TABS
100.0000 mg | ORAL_TABLET | Freq: Every day | ORAL | 2 refills | Status: AC
Start: 2023-12-27 — End: ?

## 2023-12-27 MED ORDER — ONETOUCH DELICA PLUS LANCET33G MISC
2 refills | Status: DC
Start: 2023-12-27 — End: 2024-02-13

## 2023-12-27 MED ORDER — ALENDRONATE SODIUM 70 MG PO TABS
ORAL_TABLET | ORAL | 3 refills | Status: AC
Start: 2023-12-27 — End: ?

## 2023-12-27 MED ORDER — ONETOUCH VERIO VI STRP
ORAL_STRIP | 2 refills | Status: DC
Start: 2023-12-27 — End: 2024-02-13

## 2023-12-27 MED ORDER — HYDROCHLOROTHIAZIDE 25 MG PO TABS
25.0000 mg | ORAL_TABLET | Freq: Every day | ORAL | 2 refills | Status: AC
Start: 2023-12-27 — End: ?

## 2023-12-27 MED ORDER — ATORVASTATIN CALCIUM 10 MG PO TABS
ORAL_TABLET | ORAL | 2 refills | Status: AC
Start: 2023-12-27 — End: ?

## 2023-12-27 MED ORDER — PIOGLITAZONE HCL 15 MG PO TABS: 15.0000 mg | ORAL_TABLET | Freq: Every morning | ORAL | 2 refills | Status: DC

## 2023-12-27 NOTE — Progress Notes (Signed)
 [facility] [address] [place], [place] [address]  Internal MEDICINE  Office Visit Note  Patient Name: [name]  [id number]  [id number]  Date of Service: 12/27/2023  Chief Complaint  Patient presents with   Diabetes   Gastroesophageal Reflux   Hypertension   Hyperlipidemia   Medicare Wellness    HPI [name] presents for an annual well visit and physical exam.  Well-appearing 83 y.o. female with hypertension, PSVT, stage 3a CKD, RA, diabetes, high cholesterol, GAD, and low vitamin D   Routine CRC screening: discontinued, aged out.  Routine mammogram: done in January this year, will repeat next year in January  DEXA scan: on alendronate   Eye exam: goes to  eye center - going every 6 weeks, has been getting injections.  foot exam: done  Labs: due for routine labs  New or worsening pain: no more than her usual chronic pain.  Other concerns: none      12/27/2023   11:02 AM 11/23/2022   10:01 AM 11/17/2021    9:17 AM  MMSE - Mini Mental State Exam  Orientation to time 5 5 5   Orientation to Place 5 5 5   Registration 3 3 3   Attention/ Calculation 5 5 5   Recall 3 3 3   Language- name 2 objects 2 2 2   Language- repeat 1 1 1   Language- follow 3 step command 3 3 3   Language- read & follow direction 1 1 1   Write a sentence 1 1 1   Copy design 1 1 1   Total score 30 30 30     Functional Status Survey: Is the patient deaf or have difficulty hearing?: No Does the patient have difficulty seeing, even when wearing glasses/contacts?: Yes Does the patient have difficulty concentrating, remembering, or making decisions?: Yes Does the patient have difficulty walking or climbing stairs?: No Does the patient have difficulty dressing or bathing?: No Does the patient have difficulty doing errands alone such as visiting a doctor's office or shopping?: No     01/25/2022    9:33 AM 03/08/2022   10:51 AM 06/27/2022    2:33 PM 11/23/2022    9:58 AM 12/27/2023    11:01 AM  Fall Risk  Falls in the past year? 1 0 0 0 0  Was there an injury with Fall? 0 0 0 0 0  Fall Risk Category Calculator 1 0 0 0 0  Fall Risk Category (Retired) Low  Low      (RETIRED) Patient Fall Risk Level Low fall risk  Low fall risk      Patient at Risk for Falls Due to History of fall(s) No Fall Risks No Fall Risks No Fall Risks No Fall Risks  Fall risk Follow up Falls evaluation completed  Falls evaluation completed  Falls evaluation completed Falls evaluation completed Falls evaluation completed     Data saved with a previous flowsheet row definition       12/27/2023   11:01 AM  Depression screen PHQ 2/9  Decreased Interest 0  Down, Depressed, Hopeless 0  PHQ - 2 Score 0       Current Medication: Outpatient Encounter Medications as of 12/27/2023  Medication Sig   acetaminophen  (TYLENOL ) 500 MG tablet Take 500-1,000 mg by mouth every 6 (six) hours as needed.   Alcohol Swabs (ALCOHOL PREP) PADS    alendronate  (FOSAMAX ) 70 MG tablet TAKE 1 TABLET BY MOUTH WEEKLY  TAKE WITH A FULL GLASS OF WATER  ON AN EMPTY STOMACH   amLODipine  (NORVASC )  5 MG tablet TAKE 1 TABLET BY MOUTH IN THE  EVENING   aspirin  EC 81 MG tablet Take 1 tablet (81 mg total) by mouth daily.   atorvastatin  (LIPITOR) 10 MG tablet TAKE 1 TABLET BY MOUTH AT  BEDTIME FOR HIGH  CHOLESTEROL   Blood Glucose Monitoring Suppl (ONETOUCH VERIO FLEX SYSTEM) w/Device KIT Use as directed Dxb E11.65   COMBIGAN  0.2-0.5 % ophthalmic solution Apply 1 drop to eye 2 (two) times daily. Left eye   cyanocobalamin  (VITAMIN B12) 1000 MCG tablet Take 1 tablet (1,000 mcg total) by mouth daily. (Patient taking differently: Take 2,500 mcg by mouth daily.)   Ferrous Gluconate (IRON 27 PO) Take 27 mg by mouth daily.   Ferrous Sulfate (IRON) 325 (65 Fe) MG TABS Take 1 tablet by mouth daily.   folic acid  (FOLVITE ) 1 MG tablet Take 2 tablets (2 mg total) by mouth daily.   glimepiride  (AMARYL ) 4 MG tablet TAKE ONE-HALF TABLET BY MOUTH   WITH BREAKFAST AND ONE-HALF  TABLET BY MOUTH WITH DINNER   glucose blood (ONETOUCH VERIO) test strip CHECK BLOOD GLUCOSE TWICE DAILY   hydrochlorothiazide  (HYDRODIURIL ) 25 MG tablet Take 1 tablet (25 mg total) by mouth daily.   Lancets (ONETOUCH DELICA PLUS LANCET33G) MISC CHECK BLOOD GLUCOSE TWICE DAILY   losartan  (COZAAR ) 100 MG tablet Take 1 tablet (100 mg total) by mouth daily.   methotrexate  2.5 MG tablet Take 15 mg by mouth once a week.   pantoprazole  (PROTONIX ) 40 MG tablet TAKE 1 TABLET BY MOUTH DAILY FOR ACID REFLUX   pioglitazone  (ACTOS ) 15 MG tablet Take 1 tablet (15 mg total) by mouth every morning.   prednisoLONE acetate (PRED FORTE) 1 % ophthalmic suspension Place 1 drop into the right eye 4 (four) times daily.   [DISCONTINUED] alendronate  (FOSAMAX ) 70 MG tablet TAKE 1 TABLET BY MOUTH WEEKLY  TAKE WITH A FULL GLASS OF WATER  ON AN EMPTY STOMACH   [DISCONTINUED] atorvastatin  (LIPITOR) 10 MG tablet TAKE 1 TABLET BY MOUTH AT  BEDTIME FOR HIGH CHOLESTEROL   [DISCONTINUED] folic acid  (FOLVITE ) 1 MG tablet Take 2 tablets (2 mg total) by mouth daily.   [DISCONTINUED] glucose blood (ONETOUCH VERIO) test strip CHECK BLOOD GLUCOSE TWICE DAILY   [DISCONTINUED] hydrochlorothiazide  (HYDRODIURIL ) 25 MG tablet TAKE 1 TABLET BY MOUTH DAILY   [DISCONTINUED] Lancets (ONETOUCH DELICA PLUS LANCET33G) MISC CHECK BLOOD GLUCOSE TWICE DAILY   [DISCONTINUED] losartan  (COZAAR ) 100 MG tablet TAKE 1 TABLET BY MOUTH DAILY   [DISCONTINUED] pioglitazone  (ACTOS ) 15 MG tablet TAKE 1 TABLET BY MOUTH DAILY IN  THE MORNING   [DISCONTINUED] pneumococcal 20-valent conjugate vaccine (PREVNAR 20) 0.5 ML injection Inject 0.5 mLs into the muscle once as needed for up to 1 dose for immunization.   [DISCONTINUED] traMADol  (ULTRAM ) 50 MG tablet Take 0.5-1 tablets (25-50 mg total) by mouth every 6 (six) hours as needed for severe pain.   No facility-administered encounter medications on file as of 12/27/2023.    Surgical  History: Past Surgical History:  Procedure Laterality Date   ABDOMINAL HYSTERECTOMY     ANTERIOR VITRECTOMY Left 03/10/2020   Procedure: ANTERIOR VITRECTOMY;  Surgeon: Mittie Gaskin, MD;  Location: [facility];  Service: Ophthalmology;  Laterality: Left;   CATARACT EXTRACTION     CATARACT EXTRACTION W/PHACO Left 03/10/2020   Procedure: CATARACT EXTRACTION PHACO  (IOC) LEFT  DIABETIC 16.27  02:09.7  12.5%;  Surgeon: Mittie Gaskin, MD;  Location: [facility];  Service: Ophthalmology;  Laterality: Left;  Diabetic -  oral meds   COLONOSCOPY WITH PROPOFOL  N/A 10/25/2017   Procedure: COLONOSCOPY WITH PROPOFOL ;  Surgeon: Gaylyn Gladis PENNER, MD;  Location: [facility];  Service: Endoscopy;  Laterality: N/A;   ESOPHAGOGASTRODUODENOSCOPY (EGD) WITH PROPOFOL  N/A 10/25/2017   Procedure: ESOPHAGOGASTRODUODENOSCOPY (EGD) WITH PROPOFOL ;  Surgeon: Gaylyn Gladis PENNER, MD;  Location: [facility] ENDOSCOPY;  Service: Endoscopy;  Laterality: N/A;   ESOPHAGOGASTRODUODENOSCOPY (EGD) WITH PROPOFOL  N/A 03/04/2019   Procedure: ESOPHAGOGASTRODUODENOSCOPY (EGD) WITH PROPOFOL ;  Surgeon: Gaylyn Gladis PENNER, MD;  Location: [facility] ENDOSCOPY;  Service: Endoscopy;  Laterality: N/A;   EUS N/A 01/17/2018   Procedure: ESOPHAGEAL ENDOSCOPIC ULTRASOUND (EUS) RADIAL;  Surgeon: Elta Fonda SQUIBB, MD;  Location: [facility] ENDOSCOPY;  Service: Gastroenterology;  Laterality: N/A;   right side had fatty tissue taken off     TONSILLECTOMY      Medical History: Past Medical History:  Diagnosis Date   Anemia    Arthritis    Diabetes mellitus without complication (HCC)    GERD (gastroesophageal reflux disease)    Heart murmur    Hyperlipidemia    Hypertension    Leaky heart valve    Rheumatoid arthritis (HCC)    Wears dentures     Family History: Family History  Problem Relation Age of Onset   Hypertension Mother    Diabetes Father    Heart disease Brother 48       Open heart surgery (details unknown)    Breast cancer Neg Hx     Social History   Socioeconomic History   Marital status: Widowed    Spouse name: Not on file   Number of children: Not on file   Years of education: Not on file   Highest education level: Not on file  Occupational History   Not on file  Tobacco Use   Smoking status: Never   Smokeless tobacco: Never  Vaping Use   Vaping status: Never Used  Substance and Sexual Activity   Alcohol use: No   Drug use: No   Sexual activity: Not on file  Other Topics Concern   Not on file  Social History Narrative   Not on file   Social Drivers of Health   Financial Resource Strain: Not on file  Food Insecurity: Not on file  Transportation Needs: Not on file  Physical Activity: Not on file  Stress: Not on file  Social Connections: Not on file  Intimate Partner Violence: Not on file      Review of Systems  Constitutional:  Negative for activity change, appetite change, chills, fatigue, fever and unexpected weight change.  HENT: Negative.  Negative for congestion, ear pain, rhinorrhea, sore throat and trouble swallowing.   Eyes: Negative.   Respiratory: Negative.  Negative for cough, chest tightness, shortness of breath and wheezing.   Cardiovascular: Negative.  Negative for chest pain.  Gastrointestinal: Negative.  Negative for abdominal pain, blood in stool, constipation, diarrhea, nausea and vomiting.  Endocrine: Negative.   Genitourinary: Negative.  Negative for difficulty urinating, dysuria, frequency, hematuria and urgency.  Musculoskeletal: Negative.  Negative for arthralgias, back pain, joint swelling, myalgias and neck pain.  Skin: Negative.  Negative for rash and wound.  Allergic/Immunologic: Negative.  Negative for immunocompromised state.  Neurological: Negative.  Negative for dizziness, seizures, numbness and headaches.  Hematological: Negative.   Psychiatric/Behavioral: Negative.  Negative for behavioral problems, self-injury and suicidal ideas. The  patient is not nervous/anxious.     Vital Signs: BP 132/70   Pulse 75   Temp (!) 97.5 F (36.4  C)   Resp 16   Ht 5' 2 (1.575 m)   Wt 124 lb 12.8 oz (56.6 kg)   SpO2 98%   BMI 22.83 kg/m    Physical Exam Vitals reviewed.  Constitutional:      General: She is not in acute distress.    Appearance: Normal appearance. She is well-developed and normal weight. She is not ill-appearing or diaphoretic.  HENT:     Head: Normocephalic and atraumatic.     Right Ear: Tympanic membrane, ear canal and external ear normal.     Left Ear: Tympanic membrane, ear canal and external ear normal.     Nose: Nose normal.     Mouth/Throat:     Pharynx: No oropharyngeal exudate.  Eyes:     General: No scleral icterus.       Right eye: No discharge.        Left eye: No discharge.     Conjunctiva/sclera: Conjunctivae normal.     Pupils: Pupils are equal, round, and reactive to light.  Neck:     Thyroid : No thyromegaly.     Vascular: No JVD.     Trachea: No tracheal deviation.  Cardiovascular:     Rate and Rhythm: Normal rate and regular rhythm.     Pulses:          Dorsalis pedis pulses are 2+ on the right side and 2+ on the left side.       Posterior tibial pulses are 1+ on the right side and 1+ on the left side.     Heart sounds: Murmur heard.     No friction rub. No gallop.  Pulmonary:     Effort: Pulmonary effort is normal. No respiratory distress.     Breath sounds: Normal breath sounds. No stridor. No wheezing or rales.  Chest:     Chest wall: No tenderness.  Abdominal:     General: Bowel sounds are normal. There is no distension.     Palpations: Abdomen is soft. There is no mass.     Tenderness: There is no abdominal tenderness. There is no guarding or rebound.  Musculoskeletal:        General: No tenderness or deformity. Normal range of motion.     Cervical back: Normal range of motion and neck supple.  Feet:     Right foot:     Skin integrity: Dry skin present. No ulcer,  blister, skin breakdown, erythema, warmth, callus or fissure.     Toenail Condition: Right toenails are abnormally thick and long. Fungal disease present.    Left foot:     Skin integrity: Dry skin present.     Toenail Condition: Left toenails are abnormally thick and long. Fungal disease present. Lymphadenopathy:     Cervical: No cervical adenopathy.  Skin:    General: Skin is warm and dry.     Coloration: Skin is not pale.     Findings: No erythema or rash.  Neurological:     Mental Status: She is alert.     Cranial Nerves: No cranial nerve deficit.     Motor: No abnormal muscle tone.     Coordination: Coordination normal.     Deep Tendon Reflexes: Reflexes are normal and symmetric.  Psychiatric:        Behavior: Behavior normal.        Thought Content: Thought content normal.        Judgment: Judgment normal.        Assessment/Plan: 1.  Encounter for subsequent annual wellness visit (AWV) in Medicare patient (Primary) Age-appropriate preventive screenings and vaccinations discussed. Routine labs for health maintenance will be ordered. Routine refills ordered. PHM updated.   - folic acid  (FOLVITE ) 1 MG tablet; Take 2 tablets (2 mg total) by mouth daily.  Dispense: 180 tablet; Refill: 3  2. Type 2 diabetes mellitus with other specified complication, without long-term current use of insulin  (HCC) A1c is slightly elevated but stable at 6.7. continue medications as prescribed. Follow up in 3 months to repeat A1c.  - POCT glycosylated hemoglobin (Hb A1C) - pioglitazone  (ACTOS ) 15 MG tablet; Take 1 tablet (15 mg total) by mouth every morning.  Dispense: 100 tablet; Refill: 2 - Lancets (ONETOUCH DELICA PLUS LANCET33G) MISC; CHECK BLOOD GLUCOSE TWICE DAILY  Dispense: 200 each; Refill: 2 - glucose blood (ONETOUCH VERIO) test strip; CHECK BLOOD GLUCOSE TWICE DAILY  Dispense: 200 strip; Refill: 2 - Urine Microalbumin w/creat. ratio  3. Hypertension associated with diabetes  (HCC) Stable, continue amlodipine , losartan  and hydrochlorothiazide  as prescribed.  - losartan  (COZAAR ) 100 MG tablet; Take 1 tablet (100 mg total) by mouth daily.  Dispense: 100 tablet; Refill: 2 - hydrochlorothiazide  (HYDRODIURIL ) 25 MG tablet; Take 1 tablet (25 mg total) by mouth daily.  Dispense: 100 tablet; Refill: 2  4. Hyperlipidemia associated with type 2 diabetes mellitus (HCC) Continue atorvastatin  as prescribed.  - atorvastatin  (LIPITOR) 10 MG tablet; TAKE 1 TABLET BY MOUTH AT  BEDTIME FOR HIGH  CHOLESTEROL  Dispense: 100 tablet; Refill: 2  5. Age-related osteoporosis without current pathological fracture Continue alendronate  as prescribed.  - alendronate  (FOSAMAX ) 70 MG tablet; TAKE 1 TABLET BY MOUTH WEEKLY  TAKE WITH A FULL GLASS OF WATER  ON AN EMPTY STOMACH  Dispense: 12 tablet; Refill: 3      General Counseling: Kili verbalizes understanding of the findings of todays visit and agrees with plan of treatment. I have discussed any further diagnostic evaluation that may be needed or ordered today. We also reviewed her medications today. she has been encouraged to call the office with any questions or concerns that should arise related to todays visit.    Orders Placed This Encounter  Procedures   Urine Microalbumin w/creat. ratio   POCT glycosylated hemoglobin (Hb A1C)    Meds ordered this encounter  Medications   pioglitazone  (ACTOS ) 15 MG tablet    Sig: Take 1 tablet (15 mg total) by mouth every morning.    Dispense:  100 tablet    Refill:  2    Please send a replace/new response with 100-Day Supply if appropriate to maximize member benefit. Requesting 1 year supply.   losartan  (COZAAR ) 100 MG tablet    Sig: Take 1 tablet (100 mg total) by mouth daily.    Dispense:  100 tablet    Refill:  2    Please send a replace/new response with 100-Day Supply if appropriate to maximize member benefit. Requesting 1 year supply.   Lancets (ONETOUCH DELICA PLUS LANCET33G) MISC     Sig: CHECK BLOOD GLUCOSE TWICE DAILY    Dispense:  200 each    Refill:  2    Please send a replace/new response with 100-Day Supply if appropriate to maximize member benefit. Requesting 1 year supply.   hydrochlorothiazide  (HYDRODIURIL ) 25 MG tablet    Sig: Take 1 tablet (25 mg total) by mouth daily.    Dispense:  100 tablet    Refill:  2    Please send a replace/new response with 100-Day  Supply if appropriate to maximize member benefit. Requesting 1 year supply.   glucose blood (ONETOUCH VERIO) test strip    Sig: CHECK BLOOD GLUCOSE TWICE DAILY    Dispense:  200 strip    Refill:  2    Please send a replace/new response with 100-Day Supply if appropriate to maximize member benefit. Requesting 1 year supply.   folic acid  (FOLVITE ) 1 MG tablet    Sig: Take 2 tablets (2 mg total) by mouth daily.    Dispense:  180 tablet    Refill:  3    Requesting 1 year supply   atorvastatin  (LIPITOR) 10 MG tablet    Sig: TAKE 1 TABLET BY MOUTH AT  BEDTIME FOR HIGH  CHOLESTEROL    Dispense:  100 tablet    Refill:  2    Please send a replace/new response with 100-Day Supply if appropriate to maximize member benefit. Requesting 1 year supply.   alendronate  (FOSAMAX ) 70 MG tablet    Sig: TAKE 1 TABLET BY MOUTH WEEKLY  TAKE WITH A FULL GLASS OF WATER  ON AN EMPTY STOMACH    Dispense:  12 tablet    Refill:  3    Requesting 1 year supply    Return in about 4 months (around 04/27/2024) for F/U, Recheck A1C, Edrei Norgaard PCP.   Total time spent:30 Minutes Time spent includes review of chart, medications, test results, and follow up plan with the patient.   [place] Controlled Substance Database was reviewed by me.  This patient was seen by Mardy Maxin, FNP-C in collaboration with Dr. Sigrid Bathe as a part of collaborative care agreement.  Shantese Raven R. Maxin, MSN, FNP-C Internal medicine

## 2023-12-29 ENCOUNTER — Encounter: Payer: Self-pay | Admitting: Nurse Practitioner

## 2024-02-11 DIAGNOSIS — H209 Unspecified iridocyclitis: Secondary | ICD-10-CM | POA: Diagnosis not present

## 2024-02-11 DIAGNOSIS — H35351 Cystoid macular degeneration, right eye: Secondary | ICD-10-CM | POA: Diagnosis not present

## 2024-02-11 DIAGNOSIS — H401430 Capsular glaucoma with pseudoexfoliation of lens, bilateral, stage unspecified: Secondary | ICD-10-CM | POA: Diagnosis not present

## 2024-02-11 DIAGNOSIS — H35352 Cystoid macular degeneration, left eye: Secondary | ICD-10-CM | POA: Diagnosis not present

## 2024-02-12 ENCOUNTER — Other Ambulatory Visit: Payer: Self-pay | Admitting: Nurse Practitioner

## 2024-02-12 DIAGNOSIS — E1169 Type 2 diabetes mellitus with other specified complication: Secondary | ICD-10-CM | POA: Diagnosis not present

## 2024-02-12 DIAGNOSIS — E1165 Type 2 diabetes mellitus with hyperglycemia: Secondary | ICD-10-CM | POA: Diagnosis not present

## 2024-02-12 DIAGNOSIS — E782 Mixed hyperlipidemia: Secondary | ICD-10-CM | POA: Diagnosis not present

## 2024-02-12 DIAGNOSIS — E559 Vitamin D deficiency, unspecified: Secondary | ICD-10-CM | POA: Diagnosis not present

## 2024-02-13 ENCOUNTER — Other Ambulatory Visit: Payer: Self-pay

## 2024-02-13 LAB — LIPID PANEL
Chol/HDL Ratio: 3 ratio (ref 0.0–4.4)
Cholesterol, Total: 220 mg/dL — ABNORMAL HIGH (ref 100–199)
HDL: 73 mg/dL (ref >39)
LDL Chol Calc (NIH): 134 mg/dL — ABNORMAL HIGH (ref 0–99)
Triglycerides: 77 mg/dL (ref 0–149)
VLDL Cholesterol Cal: 13 mg/dL (ref 5–40)

## 2024-02-13 LAB — MICROALBUMIN / CREATININE URINE RATIO
Creatinine, Urine: 96.3 mg/dL
Microalb/Creat Ratio: 120 mg/g{creat} — ABNORMAL HIGH (ref 0–29)
Microalbumin, Urine: 115.2 ug/mL

## 2024-02-13 LAB — COMPREHENSIVE METABOLIC PANEL WITH GFR
ALT: 13 IU/L (ref 0–32)
AST: 25 IU/L (ref 0–40)
Albumin: 4 g/dL (ref 3.7–4.7)
Alkaline Phosphatase: 76 IU/L (ref 48–129)
BUN/Creatinine Ratio: 20 (ref 12–28)
BUN: 20 mg/dL (ref 8–27)
Bilirubin Total: 0.4 mg/dL (ref 0.0–1.2)
CO2: 23 mmol/L (ref 20–29)
Calcium: 9.4 mg/dL (ref 8.7–10.3)
Chloride: 103 mmol/L (ref 96–106)
Creatinine, Ser: 1.01 mg/dL — ABNORMAL HIGH (ref 0.57–1.00)
Globulin, Total: 2.8 g/dL (ref 1.5–4.5)
Glucose: 105 mg/dL — ABNORMAL HIGH (ref 70–99)
Potassium: 4.3 mmol/L (ref 3.5–5.2)
Sodium: 141 mmol/L (ref 134–144)
Total Protein: 6.8 g/dL (ref 6.0–8.5)
eGFR: 55 mL/min/1.73 — ABNORMAL LOW (ref >59)

## 2024-02-13 LAB — VITAMIN D 25 HYDROXY (VIT D DEFICIENCY, FRACTURES): Vit D, 25-Hydroxy: 25.8 ng/mL — ABNORMAL LOW (ref 30.0–100.0)

## 2024-02-13 MED ORDER — ACCU-CHEK GUIDE ME W/DEVICE KIT: PACK | 0 refills | Status: AC

## 2024-02-13 MED ORDER — ACCU-CHEK GUIDE TEST VI STRP: 1.0000 | ORAL_STRIP | Freq: Two times a day (BID) | 1 refills | Status: DC

## 2024-02-13 MED ORDER — ACCU-CHEK SOFTCLIX LANCETS MISC: 3 refills | Status: AC

## 2024-02-13 NOTE — Telephone Encounter (Signed)
 [employer] phar called that one touch no longer covered sent accu  chek

## 2024-03-03 DIAGNOSIS — H35352 Cystoid macular degeneration, left eye: Secondary | ICD-10-CM | POA: Diagnosis not present

## 2024-03-03 DIAGNOSIS — H35351 Cystoid macular degeneration, right eye: Secondary | ICD-10-CM | POA: Diagnosis not present

## 2024-03-18 NOTE — Progress Notes (Signed)
 AKITA MAXIM                                          MRN: [id number]   03/18/2024   The [facility] Quality Team Specialist reviewed this patient medical record for the purposes of chart review for care gap closure. The following were reviewed: chart review for care gap closure-kidney health evaluation for diabetes:eGFR  and uACR.    [facility] Quality Team

## 2024-03-19 ENCOUNTER — Ambulatory Visit: Payer: Self-pay | Admitting: Internal Medicine

## 2024-04-11 LAB — HM DIABETES EYE EXAM

## 2024-04-21 ENCOUNTER — Ambulatory Visit: Admitting: Nurse Practitioner

## 2024-04-22 ENCOUNTER — Ambulatory Visit (INDEPENDENT_AMBULATORY_CARE_PROVIDER_SITE_OTHER): Admitting: Nurse Practitioner

## 2024-04-22 ENCOUNTER — Encounter: Payer: Self-pay | Admitting: Nurse Practitioner

## 2024-04-22 VITALS — BP 126/80 | HR 60 | Temp 95.2°F | Resp 16 | Ht 62.0 in | Wt 123.6 lb

## 2024-04-22 DIAGNOSIS — M0579 Rheumatoid arthritis with rheumatoid factor of multiple sites without organ or systems involvement: Secondary | ICD-10-CM

## 2024-04-22 DIAGNOSIS — E1169 Type 2 diabetes mellitus with other specified complication: Secondary | ICD-10-CM

## 2024-04-22 DIAGNOSIS — N1832 Chronic kidney disease, stage 3b: Secondary | ICD-10-CM | POA: Diagnosis not present

## 2024-04-22 DIAGNOSIS — Z23 Encounter for immunization: Secondary | ICD-10-CM

## 2024-04-22 DIAGNOSIS — E785 Hyperlipidemia, unspecified: Secondary | ICD-10-CM | POA: Diagnosis not present

## 2024-04-22 DIAGNOSIS — E559 Vitamin D deficiency, unspecified: Secondary | ICD-10-CM

## 2024-04-22 LAB — POCT GLYCOSYLATED HEMOGLOBIN (HGB A1C): Hemoglobin A1C: 6.5 % — AB (ref 4.0–5.6)

## 2024-04-22 MED ORDER — ZOSTER VAC RECOMB ADJUVANTED 50 MCG/0.5ML IM SUSR: 0.5000 mL | Freq: Once | INTRAMUSCULAR | 1 refills | Status: DC | PRN

## 2024-04-22 MED ORDER — DAPAGLIFLOZIN PROPANEDIOL 10 MG PO TABS: 10.0000 mg | ORAL_TABLET | Freq: Every day | ORAL | 3 refills | Status: AC

## 2024-04-22 MED ORDER — PNEUMOCOCCAL 20-VAL CONJ VACC 0.5 ML IM SUSY: 0.5000 mL | PREFILLED_SYRINGE | Freq: Once | INTRAMUSCULAR | 0 refills | Status: DC | PRN

## 2024-04-22 NOTE — Progress Notes (Signed)
 [facility] [address] [place], [place] [address]  Internal MEDICINE  Office Visit Note  Patient Name: [name]  [id number]  [id number]  Date of Service: 04/22/2024  Chief Complaint  Patient presents with   Diabetes   Gastroesophageal Reflux   Hypertension   Hyperlipidemia   Follow-up    HPI Norrine presents for a follow-up visit for diabetes, lab results and vaccinations.   Diabetes -- A1c is slightly improved to 6.5 today from 6.7 in July earlier this year. A1c is stable. She is on pioglitazone  currently which does have some side effects including weight gain. She is not currently on a SGLT2i which would be beneficial for her glucose level and her kidney function  CKD stage 3B -- creatinine 1.4, eGFR 37 Elevated ACR 120  Mildly low vitamin D   LDL 134. Total chol 220 Need lancing device for accu chek Due for pneumonia and shingles vaccines.    Current Medication: Outpatient Encounter Medications as of 04/22/2024  Medication Sig   dapagliflozin  propanediol (FARXIGA ) 10 MG TABS tablet Take 1 tablet (10 mg total) by mouth daily before breakfast.   pneumococcal 20-valent conjugate vaccine (PREVNAR 20) 0.5 ML injection Inject 0.5 mLs into the muscle once as needed for up to 1 dose.   Zoster Vaccine Adjuvanted [facility]) injection Inject 0.5 mLs into the muscle once as needed (shingles vaccine).   Accu-Chek Softclix Lancets lancets Use as instructed twice a daily DX E11.65   acetaminophen  (TYLENOL ) 500 MG tablet Take 500-1,000 mg by mouth every 6 (six) hours as needed.   Alcohol Swabs (ALCOHOL PREP) PADS    alendronate  (FOSAMAX ) 70 MG tablet TAKE 1 TABLET BY MOUTH WEEKLY  TAKE WITH A FULL GLASS OF WATER  ON AN EMPTY STOMACH   amLODipine  (NORVASC ) 5 MG tablet TAKE 1 TABLET BY MOUTH IN THE  EVENING   aspirin  EC 81 MG tablet Take 1 tablet (81 mg total) by mouth daily.   atorvastatin  (LIPITOR) 10 MG tablet TAKE 1 TABLET BY MOUTH AT  BEDTIME FOR HIGH   CHOLESTEROL   Blood Glucose Monitoring Suppl (ACCU-CHEK GUIDE ME) w/Device KIT Use as directed DX E11.65   COMBIGAN  0.2-0.5 % ophthalmic solution Apply 1 drop to eye 2 (two) times daily. Left eye   cyanocobalamin  (VITAMIN B12) 1000 MCG tablet Take 1 tablet (1,000 mcg total) by mouth daily. (Patient taking differently: Take 2,500 mcg by mouth daily.)   Ferrous Gluconate (IRON 27 PO) Take 27 mg by mouth daily.   Ferrous Sulfate (IRON) 325 (65 Fe) MG TABS Take 1 tablet by mouth daily.   folic acid  (FOLVITE ) 1 MG tablet Take 2 tablets (2 mg total) by mouth daily.   glimepiride  (AMARYL ) 4 MG tablet TAKE ONE-HALF TABLET BY MOUTH  WITH BREAKFAST AND ONE-HALF  TABLET BY MOUTH WITH DINNER   glucose blood (ACCU-CHEK GUIDE TEST) test strip 1 each by Other route 2 (two) times daily. Use as instructed   hydrochlorothiazide  (HYDRODIURIL ) 25 MG tablet Take 1 tablet (25 mg total) by mouth daily.   losartan  (COZAAR ) 100 MG tablet Take 1 tablet (100 mg total) by mouth daily.   methotrexate  2.5 MG tablet Take 15 mg by mouth once a week.   pantoprazole  (PROTONIX ) 40 MG tablet TAKE 1 TABLET BY MOUTH DAILY FOR ACID REFLUX   pioglitazone  (ACTOS ) 15 MG tablet Take 1 tablet (15 mg total) by mouth every morning.   prednisoLONE acetate (PRED FORTE) 1 % ophthalmic suspension Place 1 drop into the right  eye 4 (four) times daily.   No facility-administered encounter medications on file as of 04/22/2024.    Surgical History: Past Surgical History:  Procedure Laterality Date   ABDOMINAL HYSTERECTOMY     ANTERIOR VITRECTOMY Left 03/10/2020   Procedure: ANTERIOR VITRECTOMY;  Surgeon: Mittie Gaskin, MD;  Location: [facility];  Service: Ophthalmology;  Laterality: Left;   CATARACT EXTRACTION     CATARACT EXTRACTION W/PHACO Left 03/10/2020   Procedure: CATARACT EXTRACTION PHACO  (IOC) LEFT  DIABETIC 16.27  02:09.7  12.5%;  Surgeon: Mittie Gaskin, MD;  Location: [facility];  Service:  Ophthalmology;  Laterality: Left;  Diabetic - oral meds   COLONOSCOPY WITH PROPOFOL  N/A 10/25/2017   Procedure: COLONOSCOPY WITH PROPOFOL ;  Surgeon: Gaylyn Gladis PENNER, MD;  Location: [facility] END[facility]COPY;  Service: Endoscopy;  Laterality: N/A;   ESOPHAGOGASTRODUODENOSCOPY (EGD) WITH PROPOFOL  N/A 10/25/2017   Procedure: ESOPHAGOGASTRODUODENOSCOPY (EGD) WITH PROPOFOL ;  Surgeon: Gaylyn Gladis PENNER, MD;  Location: [facility] ENDOSCOPY;  Service: Endoscopy;  Laterality: N/A;   ESOPHAGOGASTRODUODENOSCOPY (EGD) WITH PROPOFOL  N/A 03/04/2019   Procedure: ESOPHAGOGASTRODUODENOSCOPY (EGD) WITH PROPOFOL ;  Surgeon: Gaylyn Gladis PENNER, MD;  Location: [facility] ENDOSCOPY;  Service: Endoscopy;  Laterality: N/A;   EUS N/A 01/17/2018   Procedure: ESOPHAGEAL ENDOSCOPIC ULTRASOUND (EUS) RADIAL;  Surgeon: Elta Fonda SQUIBB, MD;  Location: [facility] ENDOSCOPY;  Service: Gastroenterology;  Laterality: N/A;   right side had fatty tissue taken off     TONSILLECTOMY      Medical History: Past Medical History:  Diagnosis Date   Anemia    Arthritis    Diabetes mellitus without complication (HCC)    GERD (gastroesophageal reflux disease)    Heart murmur    Hyperlipidemia    Hypertension    Leaky heart valve    Rheumatoid arthritis (HCC)    Wears dentures     Family History: Family History  Problem Relation Age of Onset   Hypertension Mother    Diabetes Father    Heart disease Brother 77       Open heart surgery (details unknown)   Breast cancer Neg Hx     Social History   Socioeconomic History   Marital status: Widowed    Spouse name: Not on file   Number of children: Not on file   Years of education: Not on file   Highest education level: Not on file  Occupational History   Not on file  Tobacco Use   Smoking status: Never   Smokeless tobacco: Never  Vaping Use   Vaping status: Never Used  Substance and Sexual Activity   Alcohol use: No   Drug use: No   Sexual activity: Not on file  Other Topics Concern   Not on  file  Social History Narrative   Not on file   Social Drivers of Health   Financial Resource Strain: Not on file  Food Insecurity: Not on file  Transportation Needs: Not on file  Physical Activity: Not on file  Stress: Not on file  Social Connections: Not on file  Intimate Partner Violence: Not on file      Review of Systems  Constitutional:  Negative for chills, fatigue and unexpected weight change.  HENT:  Negative for congestion, postnasal drip, rhinorrhea, sneezing and sore throat.   Eyes:  Negative for redness and visual disturbance.  Respiratory:  Negative for cough, chest tightness and shortness of breath.   Cardiovascular:  Negative for chest pain and palpitations.  Gastrointestinal:  Negative for abdominal pain, constipation, diarrhea, nausea  and vomiting.  Genitourinary:  Negative for dysuria and frequency.  Musculoskeletal:  Negative for arthralgias, back pain, joint swelling and neck pain.  Skin:  Negative for rash.  Neurological: Negative.  Negative for tremors and numbness.  Hematological:  Negative for adenopathy. Does not bruise/bleed easily.  Psychiatric/Behavioral:  Negative for behavioral problems (Depression), sleep disturbance and suicidal ideas. The patient is not nervous/anxious.     Vital Signs: BP 126/80   Pulse 60   Temp (!) 95.2 F (35.1 C)   Resp 16   Ht 5' 2 (1.575 m)   Wt 123 lb 9.6 oz (56.1 kg)   SpO2 96%   BMI 22.61 kg/m    Physical Exam Vitals reviewed.  Constitutional:      General: She is not in acute distress.    Appearance: Normal appearance. She is normal weight. She is not ill-appearing.  HENT:     Head: Normocephalic and atraumatic.  Eyes:     Pupils: Pupils are equal, round, and reactive to light.  Cardiovascular:     Rate and Rhythm: Normal rate and regular rhythm.  Pulmonary:     Effort: Pulmonary effort is normal. No respiratory distress.  Neurological:     Mental Status: She is alert and oriented to person,  place, and time.  Psychiatric:        Mood and Affect: Mood normal.        Behavior: Behavior normal.        Assessment/Plan: 1. Type 2 diabetes mellitus with other specified complication, without long-term current use of insulin  (HCC) (Primary) A1c is slightly improved and stable. Start farxiga  as prescribed, samples given to the patient. Stop pioglitazone . New lancing device ordered as well.  - POCT glycosylated hemoglobin (Hb A1C) - dapagliflozin  propanediol (FARXIGA ) 10 MG TABS tablet; Take 1 tablet (10 mg total) by mouth daily before breakfast.  Dispense: 90 tablet; Refill: 3 - Lancet Devices (LANCING DEVICE) MISC; 1 accu-chek softclix lancing device to use when checking her glucose for diabetes E11.65  Dispense: 1 each; Refill: 0  2. Stage 3b chronic kidney disease (HCC) Start farxiga  as prescribed, samples given to the patient. Stop pioglitazone .  - dapagliflozin  propanediol (FARXIGA ) 10 MG TABS tablet; Take 1 tablet (10 mg total) by mouth daily before breakfast.  Dispense: 90 tablet; Refill: 3  3. Hyperlipidemia associated with type 2 diabetes mellitus (HCC) Continue atorvastatin  as prescribed.   4. Rheumatoid arthritis involving multiple sites with positive rheumatoid factor (HCC) Continue to follow up with rheumatology and continue medications as prescribed.   5. Vitamin D  deficiency Continue OTC vitamin D  supplement.   6. Need for vaccination Pneumonia and shingles vaccine prescriptions sent to the pharmacy   General Counseling: Malai verbalizes understanding of the findings of todays visit and agrees with plan of treatment. I have discussed any further diagnostic evaluation that may be needed or ordered today. We also reviewed her medications today. she has been encouraged to call the office with any questions or concerns that should arise related to todays visit.    Orders Placed This Encounter  Procedures   POCT glycosylated hemoglobin (Hb A1C)    Meds  ordered this encounter  Medications   dapagliflozin  propanediol (FARXIGA ) 10 MG TABS tablet    Sig: Take 1 tablet (10 mg total) by mouth daily before breakfast.    Dispense:  90 tablet    Refill:  3    Dx code E11.65 and N18.2. start farxiga  and discontinue pioglitazine   Zoster Vaccine  Adjuvanted (SHINGRIX) injection    Sig: Inject 0.5 mLs into the muscle once as needed (shingles vaccine).    Dispense:  0.5 mL    Refill:  1    Due for 2nd shingles shot soon   pneumococcal 20-valent conjugate vaccine (PREVNAR 20) 0.5 ML injection    Sig: Inject 0.5 mLs into the muscle once as needed for up to 1 dose.    Dispense:  0.5 mL    Refill:  0    Due for prevnar 20 if not already administered    Return in about 4 weeks (around 05/20/2024) for F/U, eval new med, Hesper Venturella PCP in 4-6 weeks. .   Total time spent:30 Minutes Time spent includes review of chart, medications, test results, and follow up plan with the patient.   [place] Controlled Substance Database was reviewed by me.  This patient was seen by Mardy Maxin, FNP-C in collaboration with Dr. Sigrid Bathe as a part of collaborative care agreement.   Jovontae Banko R. Maxin, MSN, FNP-C Internal medicine

## 2024-04-22 NOTE — Patient Instructions (Signed)
 STOP Pioglitazone   START Farxiga  (dapagliflozin )

## 2024-04-27 NOTE — Progress Notes (Unsigned)
 Cardiology Clinic Note   Date: 04/30/2024 ID: ZAYANA SALVADOR, DOB 1940/12/24, MRN [id number]  Primary Cardiologist:  Lonni Hanson, MD  Chief Complaint   [name] is a 83 y.o. female who presents to the clinic today for routine follow up.   Patient Profile   [name] is followed by Dr. Hanson for the history outlined below.       Past medical history significant for: PSVT. 14-day ZIO 09/12/2021: HR 42 to 106 bpm, average 57 bpm.  Predominantly sinus rhythm.  Occasional PACs 1.1% burden.  Rare PVCs.  1 episode of NSVT lasting 6 beats max rate 160 bpm.  109 atrial runs lasting 14.7 seconds with max rate 193 bpm.  No sustained arrhythmias or prolonged pauses.  No patient triggered events. Valvular heart disease. Echo 04/19/2023: EF 60 to 65%.  No RWMA.  Mild LVH.  Moderate asymmetric basal septal hypertrophy.  LVOT gradient 35 mmHg.  Grade I DD.  Normal RV size/function.  Mildly elevated PA pressure, RVSP 43.9 mmHg.  Mild MR.  Moderate MAC.  Mild to moderate TR.  Mild AI.  Aortic valve sclerosis without stenosis.  Mild LAE. Hypertension. Hyperlipidemia. Lipid panel 02/12/2024: LDL 134, HDL 73, TG 77, total 220. T2DM. CKD stage IIIa. Vasovagal syncope. Anemia. RA.  In summary, prior echo from outside office demonstrated hyperdynamic LV function with EF > 70%, moderate LVH, small left ventricle, normal RV size/function, mild AS, moderate AI, mild MR, moderate TR, mild pulmonary hypertension.  She establish care with Dr. Hanson in September 2020.  Repeat echo in October 2021 showed EF 60 to 65%, no RWMA, Grade I DD, mild LVH, normal RV size/function, mildly elevated PA pressure RVSP 42.2 mmHg, mild MR, mild AI, mild to moderate aortic valve sclerosis without stenosis.  In December 2021 she complained of intermittent palpitations lasting 1 to 2 seconds without associated symptoms.  She was seen in February 2022 for follow-up after a syncopal episode in late December 2021.   Outpatient cardiac monitoring demonstrated predominantly sinus rhythm with an average rate of 66 bpm, 1 episode of NSVT for 7 beats with max rate 152 bpm, 157 episodes of SVT lasting up to 26.5 seconds with max rate of 207 bpm, occasional PACs with 1.8% burden, rare PVCs and no patient triggered events.  She was evaluated by EP for syncope and frequent episodes of SVT with inability to tolerate BB secondary to baseline bradycardia.  EP felt syncopal episodes consistent with vasovagal etiology.  Pharmacologic therapy was deferred secondary to patient's lack of symptoms during runs of SVT and not related to syncopal episode.  Repeat outpatient cardiac monitoring in April 2023 demonstrated continued runs of SVT as detailed above.  She underwent hospital admission in August 2023 for generalized weakness and hypertensive urgency.  Troponin was negative.  Echo demonstrated EF 60 to 65%, no RWMA, Grade I DD, normal RV size/function, mildly elevated PA pressure RVSP 42.9 mmHg, moderate LAE, mild MR with moderate MAC, mild AI.  MRI was negative for acute stroke or abnormality.  Following that hospital admission she was evaluated in the ED for mechanical fall sustaining L1 compression fracture.  Patient was seen in the clinic on 04/19/2023 for routine follow-up.  She complained of worsening vision issues and was no longer driving.  BP was mildly elevated in the office but reported as normal at home.  No medication changes were made.   Patient was last seen in the office by me on 10/18/2023 for routine follow-up.  She was doing well at that time with no cardiac complaints.  She did report occasional palpitations particularly at night that were brief and did not disturb her sleep.  No medication changes were made.     History of Present Illness    Today, patient is here alone. Patient denies lower extremity edema, orthopnea or PND. She reports dyspnea with heavier exertion like walking up the steep hill from the  mailbox. No dyspnea with routine activities. No chest pain, pressure, or tightness. No palpitations. She lives with her son who is not in good health. She is able to do light to moderate activities around her home as long as she paces herself and takes rest breaks. She noticed a hard spot on her left inner lower leg a couple of week ago and mentioned it to her PCP last week. Her PCP did not feel it was a blood clot and wants to continue to watch it.     ROS: All other systems reviewed and are otherwise negative except as noted in History of Present Illness.  EKGs/Labs Reviewed    EKG Interpretation Date/Time:  Wednesday April 30 2024 11:25:39 EST Ventricular Rate:  46 PR Interval:  160 QRS Duration:  92 QT Interval:  484 QTC Calculation: 423 R Axis:   -25  Text Interpretation: Sinus bradycardia Left ventricular hypertrophy with repolarization abnormality ( R in aVL , Cornell product ) When compared with ECG of 18-Oct-2023 08:22, No significant change was found Confirmed by Loistine Sober [phone]) on 04/30/2024 11:32:44 AM   02/12/2024: ALT 13; AST 25; BUN 20; Creatinine, Ser 1.01; Potassium 4.3; Sodium 141    Physical Exam    VS:  BP (!) 100/56 (BP Location: Left Arm, Patient Position: Sitting, Cuff Size: Normal)   Pulse (!) 46   Ht 5' 2 (1.575 m)   Wt 121 lb 2 oz (54.9 kg)   SpO2 99%   BMI 22.15 kg/m  , BMI Body mass index is 22.15 kg/m.  GEN: Well nourished, well developed, in no acute distress. Neck: No JVD or carotid bruits. Cardiac:  RRR. 2/6 systolic murmur. No rubs or gallops.   Respiratory:  Respirations regular and unlabored. Clear to auscultation without rales, wheezing or rhonchi. GI: Soft, nontender, nondistended. Extremities: Radials/DP/PT 2+ and equal bilaterally. No clubbing or cyanosis. No edema. 2 in x 2 in area of induration without erythema, edema or tenderness to left medial pretibial area.   Skin: Warm and dry, no rash. Neuro: Strength  intact.  Assessment & Plan   PSVT/bradycardia 14-day ZIO April 2023 demonstrated HR 42 to 106 bpm, average 57 bpm, predominantly sinus rhythm, occasional PACs 1.1% burden, rare PVCs, 1 episode of NSVT lasting 6 beats max rate 160 bpm, 109 atrial runs lasting 14.7 seconds with max rate 193 bpm.  Patient denies palpitations. EKG today demonstrates bradycardia 46 bpm. Walked one lap around the office with patient and heart rate increased to 60 bpm. She denies dyspnea, lightheadedness, dizziness, presyncope or syncope.  - Repeat outpatient monitoring as clinically indicated.   Valvular heart disease Echo November 2024 demonstrated normal LV/RV function, mild LVH, Grade I DD, mildly elevated PA pressure, mild MR, mild to moderate TR, mild AI.  Patient denies shortness of breath, lightheadedness, dizziness, presyncope or syncope. She has no lower extremity edema. She experiences mild dyspnea with heavier exertion such as walking up a steep hill, none with routine activities. 2/6 systolic murmur on exam today. - Schedule echo at patient's earliest convenience.  Hypertension BP today 100/56. She denies headaches or dizziness. She has a home wrist BP cuff with her today. She is very slender and it does not fit around her wrist. We tested it around her forearm and reading correlated with manual reading today. Patient is going to check BP once a day and provide log to PCP. She may benefit from a reduction in losartan  or hydrochlorothiazide . BP log is provided today.  - Keep daily BP log for next few weeks until follow up with PCP.  - Continue amlodipine , hydrochlorothiazide , losartan .   Hyperlipidemia LDL 134 September 2025. - Continue atorvastatin . - Continue to follow with PCP.  Lower leg abnormality Patient reports noticing a hard spot on her left inner lower leg a few weeks ago. She mentioned it to PCP who did not feel she had a blood clot and wanted to watch the area. There is a approximately 2 in  x 2 in area on medial pretibial area that is indurated without erythema, edema or tenderness. Discussed obtaining an US . Patient would like to defer testing and follow up with PCP at the end of the month as previously scheduled.  - Instructed to contact this office or PCP if area becomes swollen, red, warm to touch or painful.   Disposition: Schedule echo. Return in 6 months or sooner as needed.          Signed, Barnie HERO. [facility] Illingworth, DNP, NP-C

## 2024-04-29 ENCOUNTER — Other Ambulatory Visit: Payer: Self-pay | Admitting: Nurse Practitioner

## 2024-04-29 DIAGNOSIS — E1165 Type 2 diabetes mellitus with hyperglycemia: Secondary | ICD-10-CM

## 2024-04-29 DIAGNOSIS — K219 Gastro-esophageal reflux disease without esophagitis: Secondary | ICD-10-CM

## 2024-04-30 ENCOUNTER — Encounter: Payer: Self-pay | Admitting: Student

## 2024-04-30 ENCOUNTER — Ambulatory Visit: Attending: Student | Admitting: Student

## 2024-04-30 VITALS — BP 100/56 | HR 46 | Ht 62.0 in | Wt 121.1 lb

## 2024-04-30 DIAGNOSIS — E7800 Pure hypercholesterolemia, unspecified: Secondary | ICD-10-CM | POA: Diagnosis present

## 2024-04-30 DIAGNOSIS — R001 Bradycardia, unspecified: Secondary | ICD-10-CM | POA: Insufficient documentation

## 2024-04-30 DIAGNOSIS — I38 Endocarditis, valve unspecified: Secondary | ICD-10-CM | POA: Diagnosis present

## 2024-04-30 DIAGNOSIS — I471 Supraventricular tachycardia, unspecified: Secondary | ICD-10-CM | POA: Insufficient documentation

## 2024-04-30 DIAGNOSIS — I10 Essential (primary) hypertension: Secondary | ICD-10-CM | POA: Diagnosis present

## 2024-04-30 DIAGNOSIS — R234 Changes in skin texture: Secondary | ICD-10-CM | POA: Insufficient documentation

## 2024-04-30 NOTE — Patient Instructions (Signed)
 Medication Instructions:   Your physician recommends that you continue on your current medications as directed. Please refer to the Current Medication list given to you today.    *If you need a refill on your cardiac medications before your next appointment, please call your pharmacy*  Lab Work:  None ordered at this time   If you have labs (blood work) drawn today and your tests are completely normal, you will receive your results only by:  MyChart Message (if you have MyChart) OR  A paper copy in the mail If you have any lab test that is abnormal or we need to change your treatment, we will call you to review the results.  Testing/Procedures:  Your physician has requested that you have an echocardiogram. Echocardiography is a painless test that uses sound waves to create images of your heart. It provides your doctor with information about the size and shape of your heart and how well your heart's chambers and valves are working.   You may receive an ultrasound enhancing agent through an IV if needed to better visualize your heart during the echo. This procedure takes approximately one hour.  There are no restrictions for this procedure.  This will take place at [address] [facility]) [address], [place] [address]  Please note: We ask at that you not bring children with you during ultrasound (echo/ vascular) testing. Due to room size and safety concerns, children are not allowed in the ultrasound rooms during exams. Our front office staff cannot provide observation of children in our lobby area while testing is being conducted. An adult accompanying a patient to their appointment will only be allowed in the ultrasound room at the discretion of the ultrasound technician under special circumstances. We apologize for any inconvenience.   Referrals:  None ordered at this time   Follow-Up:  At [facility], you and your health needs are our priority.  As part of  our continuing mission to provide you with exceptional heart care, our providers are all part of one team.  This team includes your primary Cardiologist (physician) and Advanced Practice Providers or APPs (Physician Assistants and Nurse Practitioners) who all work together to provide you with the care you need, when you need it.  Your next appointment:   5 - 6 month(s)  Provider:    Lonni Hanson, MD or Barnie Hila, NP    We recommend signing up for the patient portal called MyChart.  Sign up information is provided on this After Visit Summary.  MyChart is used to connect with patients for Virtual Visits (Telemedicine).  Patients are able to view lab/test results, encounter notes, upcoming appointments, etc.  Non-urgent messages can be sent to your provider as well.   To learn more about what you can do with MyChart, go to [email or web address].   Other Instructions  Avoid alcohol, caffeine, and exercise within 30 minutes before checking blood pressure. Sit still, with feet flat on the floor, in a straight backed, supportive chair.  Rest for 5 minutes.  Support arm on flat surface at heart level. Bottom of cuff should be placed directly above the bend of the elbow. Take multiple readings.  Write down and bring to all follow up appointments.  Bring cuff to clinic periodically for accuracy comparison.

## 2024-05-27 ENCOUNTER — Ambulatory Visit (INDEPENDENT_AMBULATORY_CARE_PROVIDER_SITE_OTHER): Admitting: Nurse Practitioner

## 2024-05-27 ENCOUNTER — Encounter: Payer: Self-pay | Admitting: Nurse Practitioner

## 2024-05-27 VITALS — BP 108/60 | HR 96 | Temp 96.0°F | Resp 16 | Ht 62.0 in | Wt 120.2 lb

## 2024-05-27 DIAGNOSIS — I152 Hypertension secondary to endocrine disorders: Secondary | ICD-10-CM | POA: Diagnosis not present

## 2024-05-27 DIAGNOSIS — E1165 Type 2 diabetes mellitus with hyperglycemia: Secondary | ICD-10-CM

## 2024-05-27 DIAGNOSIS — E785 Hyperlipidemia, unspecified: Secondary | ICD-10-CM

## 2024-05-27 DIAGNOSIS — E1159 Type 2 diabetes mellitus with other circulatory complications: Secondary | ICD-10-CM

## 2024-05-27 DIAGNOSIS — E1169 Type 2 diabetes mellitus with other specified complication: Secondary | ICD-10-CM

## 2024-05-27 DIAGNOSIS — M0579 Rheumatoid arthritis with rheumatoid factor of multiple sites without organ or systems involvement: Secondary | ICD-10-CM

## 2024-05-27 DIAGNOSIS — N1832 Chronic kidney disease, stage 3b: Secondary | ICD-10-CM | POA: Diagnosis not present

## 2024-05-27 DIAGNOSIS — Z23 Encounter for immunization: Secondary | ICD-10-CM

## 2024-05-27 MED ORDER — PNEUMOCOCCAL 20-VAL CONJ VACC 0.5 ML IM SUSY: 0.5000 mL | PREFILLED_SYRINGE | Freq: Once | INTRAMUSCULAR | 0 refills | Status: AC | PRN

## 2024-05-27 MED ORDER — ZOSTER VAC RECOMB ADJUVANTED 50 MCG/0.5ML IM SUSR: 0.5000 mL | Freq: Once | INTRAMUSCULAR | 1 refills | Status: AC | PRN

## 2024-05-27 NOTE — Progress Notes (Signed)
 [facility] [address] [place], [place] [address]  Internal MEDICINE  Office Visit Note  Patient Name: [name]  [id number]  [id number]  Date of Service: 05/27/2024  Chief Complaint  Patient presents with   Diabetes   Gastroesophageal Reflux   Hyperlipidemia   Hypertension   Follow-up    HPI Amilya presents for a follow-up visit for diabetes, new medication, high cholesterol, hypertension and vaccinations.  Diabetes -- started on farxiga  and is tolerating the new medication well. Stopped pioglitazone .  High cholesterol -- taking atorvastatin  daily.  Still in need of getting her vaccinations, has been out of town, reminded her she can go to [facility] anytime.  Hypertension -- controlled with current medications.     Current Medication: Outpatient Encounter Medications as of 05/27/2024  Medication Sig   ACCU-CHEK GUIDE TEST test strip USE TWICE DAILY AS INSTRUCTED   Accu-Chek Softclix Lancets lancets Use as instructed twice a daily DX E11.65   Blood Glucose Monitoring Suppl (ACCU-CHEK GUIDE ME) w/Device KIT Use as directed DX E11.65   dapagliflozin  propanediol (FARXIGA ) 10 MG TABS tablet Take 1 tablet (10 mg total) by mouth daily before breakfast.   acetaminophen  (TYLENOL ) 500 MG tablet Take 500-1,000 mg by mouth every 6 (six) hours as needed.   Alcohol Swabs (ALCOHOL PREP) PADS    alendronate  (FOSAMAX ) 70 MG tablet TAKE 1 TABLET BY MOUTH WEEKLY  TAKE WITH A FULL GLASS OF WATER  ON AN EMPTY STOMACH   amLODipine  (NORVASC ) 5 MG tablet TAKE 1 TABLET BY MOUTH IN THE  EVENING   aspirin  EC 81 MG tablet Take 1 tablet (81 mg total) by mouth daily.   atorvastatin  (LIPITOR) 10 MG tablet TAKE 1 TABLET BY MOUTH AT  BEDTIME FOR HIGH  CHOLESTEROL   COMBIGAN  0.2-0.5 % ophthalmic solution Apply 1 drop to eye 2 (two) times daily. Left eye   cyanocobalamin  (VITAMIN B12) 1000 MCG tablet Take 1 tablet (1,000 mcg total) by mouth daily.   Ferrous Sulfate (IRON) 325 (65 Fe) MG  TABS Take 1 tablet by mouth daily.   folic acid  (FOLVITE ) 1 MG tablet Take 2 tablets (2 mg total) by mouth daily.   glimepiride  (AMARYL ) 4 MG tablet TAKE ONE-HALF TABLET BY MOUTH  WITH BREAKFAST AND ONE-HALF  TABLET BY MOUTH WITH DINNER   hydrochlorothiazide  (HYDRODIURIL ) 25 MG tablet Take 1 tablet (25 mg total) by mouth daily.   losartan  (COZAAR ) 100 MG tablet Take 1 tablet (100 mg total) by mouth daily.   methotrexate  2.5 MG tablet Take 15 mg by mouth once a week.   pantoprazole  (PROTONIX ) 40 MG tablet TAKE 1 TABLET BY MOUTH DAILY FOR ACID REFLUX   pneumococcal 20-valent conjugate vaccine (PREVNAR 20) 0.5 ML injection Inject 0.5 mLs into the muscle once as needed for up to 1 dose.   prednisoLONE acetate (PRED FORTE) 1 % ophthalmic suspension Place 1 drop into the right eye 4 (four) times daily.   Zoster Vaccine Adjuvanted [facility]) injection Inject 0.5 mLs into the muscle once as needed (shingles vaccine).   [DISCONTINUED] pioglitazone  (ACTOS ) 15 MG tablet Take 1 tablet (15 mg total) by mouth every morning. (Patient not taking: Reported on 04/30/2024)   [DISCONTINUED] pneumococcal 20-valent conjugate vaccine (PREVNAR 20) 0.5 ML injection Inject 0.5 mLs into the muscle once as needed for up to 1 dose. (Patient not taking: Reported on 04/30/2024)   [DISCONTINUED] Zoster Vaccine Adjuvanted [facility]) injection Inject 0.5 mLs into the muscle once as needed (shingles vaccine). (Patient not taking:  Reported on 04/30/2024)   No facility-administered encounter medications on file as of 05/27/2024.    Surgical History: Past Surgical History:  Procedure Laterality Date   ABDOMINAL HYSTERECTOMY     ANTERIOR VITRECTOMY Left 03/10/2020   Procedure: ANTERIOR VITRECTOMY;  Surgeon: Mittie Gaskin, MD;  Location: [facility];  Service: Ophthalmology;  Laterality: Left;   CATARACT EXTRACTION     CATARACT EXTRACTION W/PHACO Left 03/10/2020   Procedure: CATARACT EXTRACTION PHACO  (IOC) LEFT   DIABETIC 16.27  02:09.7  12.5%;  Surgeon: Mittie Gaskin, MD;  Location: [facility];  Service: Ophthalmology;  Laterality: Left;  Diabetic - oral meds   COLONOSCOPY WITH PROPOFOL  N/A 10/25/2017   Procedure: COLONOSCOPY WITH PROPOFOL ;  Surgeon: Gaylyn Gladis PENNER, MD;  Location: [facility] ENDOSCOPY;  Service: Endoscopy;  Laterality: N/A;   ESOPHAGOGASTRODUODENOSCOPY (EGD) WITH PROPOFOL  N/A 10/25/2017   Procedure: ESOPHAGOGASTRODUODENOSCOPY (EGD) WITH PROPOFOL ;  Surgeon: Gaylyn Gladis PENNER, MD;  Location: [facility] ENDOSCOPY;  Service: Endoscopy;  Laterality: N/A;   ESOPHAGOGASTRODUODENOSCOPY (EGD) WITH PROPOFOL  N/A 03/04/2019   Procedure: ESOPHAGOGASTRODUODENOSCOPY (EGD) WITH PROPOFOL ;  Surgeon: Gaylyn Gladis PENNER, MD;  Location: [facility] ENDOSCOPY;  Service: Endoscopy;  Laterality: N/A;   EUS N/A 01/17/2018   Procedure: ESOPHAGEAL ENDOSCOPIC ULTRASOUND (EUS) RADIAL;  Surgeon: Elta Fonda SQUIBB, MD;  Location: [facility] ENDOSCOPY;  Service: Gastroenterology;  Laterality: N/A;   right side had fatty tissue taken off     TONSILLECTOMY      Medical History: Past Medical History:  Diagnosis Date   Anemia    Arthritis    Diabetes mellitus without complication (HCC)    GERD (gastroesophageal reflux disease)    Heart murmur    Hyperlipidemia    Hypertension    Leaky heart valve    Rheumatoid arthritis (HCC)    Wears dentures     Family History: Family History  Problem Relation Age of Onset   Hypertension Mother    Diabetes Father    Heart disease Brother 45       Open heart surgery (details unknown)   Breast cancer Neg Hx     Social History   Socioeconomic History   Marital status: Widowed    Spouse name: Not on file   Number of children: Not on file   Years of education: Not on file   Highest education level: Not on file  Occupational History   Not on file  Tobacco Use   Smoking status: Never   Smokeless tobacco: Never  Vaping Use   Vaping status: Never Used  Substance and Sexual  Activity   Alcohol use: No   Drug use: No   Sexual activity: Not on file  Other Topics Concern   Not on file  Social History Narrative   Not on file   Social Drivers of Health   Tobacco Use: Low Risk (05/27/2024)   Patient History    Smoking Tobacco Use: Never    Smokeless Tobacco Use: Never    Passive Exposure: Not on file  Financial Resource Strain: Not on file  Food Insecurity: Not on file  Transportation Needs: Not on file  Physical Activity: Not on file  Stress: Not on file  Social Connections: Not on file  Intimate Partner Violence: Not on file  Depression (PHQ2-9): Low Risk (12/27/2023)   Depression (PHQ2-9)    PHQ-2 Score: 0  Alcohol Screen: Low Risk (03/08/2022)   Alcohol Screen    Last Alcohol Screening Score (AUDIT): 0  Housing: Unknown (06/26/2023)   Received from [facility]   Epic    Unable to Pay for Housing in the Last Year: Not on file    Number of Times Moved in the Last Year: Not on file    At any time in the past 12 months, were you homeless or living in a shelter (including now)?: No  Utilities: Not on file  Health Literacy: Not on file      Review of Systems  Constitutional:  Negative for chills, fatigue and unexpected weight change.  HENT:  Negative for congestion, postnasal drip, rhinorrhea, sneezing and sore throat.   Eyes:  Negative for redness and visual disturbance.  Respiratory:  Negative for cough, chest tightness and shortness of breath.   Cardiovascular:  Negative for chest pain and palpitations.  Gastrointestinal:  Negative for abdominal pain, constipation, diarrhea, nausea and vomiting.  Genitourinary:  Negative for dysuria and frequency.  Musculoskeletal:  Negative for arthralgias, back pain, joint swelling and neck pain.  Skin:  Negative for rash.  Neurological: Negative.  Negative for tremors and numbness.  Hematological:  Negative for adenopathy. Does not bruise/bleed easily.  Psychiatric/Behavioral:  Negative  for behavioral problems (Depression), sleep disturbance and suicidal ideas. The patient is not nervous/anxious.     Vital Signs: BP 108/60   Pulse 96   Temp (!) 96 F (35.6 C)   Resp 16   Ht 5' 2 (1.575 m)   Wt 120 lb 3.2 oz (54.5 kg)   SpO2 97%   BMI 21.98 kg/m    Physical Exam Vitals reviewed.  Constitutional:      General: She is not in acute distress.    Appearance: Normal appearance. She is normal weight. She is not ill-appearing.  HENT:     Head: Normocephalic and atraumatic.  Eyes:     Pupils: Pupils are equal, round, and reactive to light.  Cardiovascular:     Rate and Rhythm: Normal rate and regular rhythm.  Pulmonary:     Effort: Pulmonary effort is normal. No respiratory distress.  Neurological:     Mental Status: She is alert and oriented to person, place, and time.  Psychiatric:        Mood and Affect: Mood normal.        Behavior: Behavior normal.        Assessment/Plan: 1. Type 2 diabetes mellitus with hyperglycemia, without long-term current use of insulin  (HCC) (Primary) Continue farxiga  as prescribed. Follow up to recheck A1c in 2 months   2. Stage 3b chronic kidney disease (HCC) Continue farxiga  as prescribed   3. Hypertension associated with diabetes (HCC) Stable, continue medications as prescribed.   4. Hyperlipidemia associated with type 2 diabetes mellitus (HCC) Continue atorvastatin  as prescribed   5. Rheumatoid arthritis involving multiple sites with positive rheumatoid factor (HCC) Continue follow up with rheumatology  6. Need for vaccination - pneumococcal 20-valent conjugate vaccine (PREVNAR 20) 0.5 ML injection; Inject 0.5 mLs into the muscle once as needed for up to 1 dose.  Dispense: 0.5 mL; Refill: 0 - Zoster Vaccine Adjuvanted [facility] injection; Inject 0.5 mLs into the muscle once as needed (shingles vaccine).  Dispense: 0.5 mL; Refill: 1   General Counseling: Zakariah verbalizes understanding of the findings of todays  visit and agrees with plan of treatment. I have discussed any further diagnostic evaluation that may be needed or ordered today. We also reviewed her medications today. she has been encouraged to call the office with any questions or concerns that should arise related to todays visit.  No orders of the defined types were placed in this encounter.   Meds ordered this encounter  Medications   pneumococcal 20-valent conjugate vaccine (PREVNAR 20) 0.5 ML injection    Sig: Inject 0.5 mLs into the muscle once as needed for up to 1 dose.    Dispense:  0.5 mL    Refill:  0    Due for prevnar 20 if not already administered   Zoster Vaccine Adjuvanted (SHINGRIX) injection    Sig: Inject 0.5 mLs into the muscle once as needed (shingles vaccine).    Dispense:  0.5 mL    Refill:  1    Due for 2nd shingles shot soon    Return in about 2 months (around 07/26/2024) for F/U, Recheck A1C, Neeta Storey PCP.   Total time spent:30 Minutes Time spent includes review of chart, medications, test results, and follow up plan with the patient.   [place] Controlled Substance Database was reviewed by me.  This patient was seen by Mardy Maxin, FNP-C in collaboration with Dr. Sigrid Bathe as a part of collaborative care agreement.   Lundyn Coste R. Maxin, MSN, FNP-C Internal medicine

## 2024-05-28 ENCOUNTER — Encounter: Payer: Self-pay | Admitting: Nurse Practitioner

## 2024-05-28 MED ORDER — LANCING DEVICE MISC: 0 refills | Status: AC

## 2024-06-11 ENCOUNTER — Telehealth: Payer: Self-pay | Admitting: Nurse Practitioner

## 2024-06-11 NOTE — Telephone Encounter (Signed)
 Received Chronic Condition form from [facility]. Gave to Alyssa-Toni

## 2024-06-18 ENCOUNTER — Telehealth: Payer: Self-pay | Admitting: Nurse Practitioner

## 2024-06-18 NOTE — Telephone Encounter (Signed)
 Chronic Condition form completed & faxed back to [facility] [phone]. Scanned-Toni

## 2024-07-16 ENCOUNTER — Ambulatory Visit

## 2024-07-28 ENCOUNTER — Ambulatory Visit: Admitting: Nurse Practitioner

## 2024-10-03 ENCOUNTER — Ambulatory Visit: Admitting: Student

## 2024-12-29 ENCOUNTER — Ambulatory Visit: Admitting: Nurse Practitioner
